# Patient Record
Sex: Female | Born: 1955 | Race: White | Hispanic: No | Marital: Married | Smoking: Never smoker
Health system: Southern US, Community
[De-identification: ages and names within clinical notes are randomized; demographics above are authoritative.]

## PROBLEM LIST (undated history)

## (undated) DIAGNOSIS — F32A Depression, unspecified: Secondary | ICD-10-CM

## (undated) DIAGNOSIS — K219 Gastro-esophageal reflux disease without esophagitis: Secondary | ICD-10-CM

## (undated) DIAGNOSIS — M47816 Spondylosis without myelopathy or radiculopathy, lumbar region: Secondary | ICD-10-CM

## (undated) DIAGNOSIS — M1710 Unilateral primary osteoarthritis, unspecified knee: Secondary | ICD-10-CM

## (undated) DIAGNOSIS — IMO0002 Reserved for concepts with insufficient information to code with codable children: Secondary | ICD-10-CM

## (undated) DIAGNOSIS — E669 Obesity, unspecified: Secondary | ICD-10-CM

## (undated) DIAGNOSIS — F329 Major depressive disorder, single episode, unspecified: Secondary | ICD-10-CM

## (undated) DIAGNOSIS — M545 Low back pain, unspecified: Secondary | ICD-10-CM

## (undated) DIAGNOSIS — F419 Anxiety disorder, unspecified: Secondary | ICD-10-CM

## (undated) DIAGNOSIS — G709 Myoneural disorder, unspecified: Secondary | ICD-10-CM

## (undated) DIAGNOSIS — I82409 Acute embolism and thrombosis of unspecified deep veins of unspecified lower extremity: Secondary | ICD-10-CM

## (undated) DIAGNOSIS — I10 Essential (primary) hypertension: Secondary | ICD-10-CM

## (undated) HISTORY — DX: Low back pain: M54.5

## (undated) HISTORY — DX: Reserved for concepts with insufficient information to code with codable children: IMO0002

## (undated) HISTORY — DX: Obesity, unspecified: E66.9

## (undated) HISTORY — DX: Myoneural disorder, unspecified: G70.9

## (undated) HISTORY — PX: ABDOMINAL HYSTERECTOMY: SHX81

## (undated) HISTORY — DX: Major depressive disorder, single episode, unspecified: F32.9

## (undated) HISTORY — DX: Acute embolism and thrombosis of unspecified deep veins of unspecified lower extremity: I82.409

## (undated) HISTORY — DX: Anxiety disorder, unspecified: F41.9

## (undated) HISTORY — PX: CHOLECYSTECTOMY: SHX55

## (undated) HISTORY — DX: Gastro-esophageal reflux disease without esophagitis: K21.9

## (undated) HISTORY — DX: Unilateral primary osteoarthritis, unspecified knee: M17.10

## (undated) HISTORY — DX: Depression, unspecified: F32.A

## (undated) HISTORY — DX: Spondylosis without myelopathy or radiculopathy, lumbar region: M47.816

## (undated) HISTORY — DX: Low back pain, unspecified: M54.50

---

## 1997-12-08 ENCOUNTER — Ambulatory Visit (HOSPITAL_COMMUNITY): Admission: RE | Admit: 1997-12-08 | Discharge: 1997-12-08 | Payer: Self-pay | Admitting: Obstetrics and Gynecology

## 1999-02-09 ENCOUNTER — Emergency Department (HOSPITAL_COMMUNITY): Admission: EM | Admit: 1999-02-09 | Discharge: 1999-02-09 | Payer: Self-pay | Admitting: Emergency Medicine

## 2001-12-31 ENCOUNTER — Encounter: Payer: Self-pay | Admitting: Emergency Medicine

## 2001-12-31 ENCOUNTER — Inpatient Hospital Stay (HOSPITAL_COMMUNITY): Admission: EM | Admit: 2001-12-31 | Discharge: 2002-01-02 | Payer: Self-pay | Admitting: Emergency Medicine

## 2002-01-01 ENCOUNTER — Encounter: Payer: Self-pay | Admitting: Infectious Diseases

## 2004-12-04 ENCOUNTER — Emergency Department (HOSPITAL_COMMUNITY): Admission: EM | Admit: 2004-12-04 | Discharge: 2004-12-04 | Payer: Self-pay | Admitting: Emergency Medicine

## 2005-09-09 ENCOUNTER — Emergency Department (HOSPITAL_COMMUNITY): Admission: EM | Admit: 2005-09-09 | Discharge: 2005-09-09 | Payer: Self-pay | Admitting: Emergency Medicine

## 2005-09-13 ENCOUNTER — Ambulatory Visit (HOSPITAL_COMMUNITY): Admission: RE | Admit: 2005-09-13 | Discharge: 2005-09-13 | Payer: Self-pay | Admitting: Emergency Medicine

## 2005-09-25 ENCOUNTER — Inpatient Hospital Stay (HOSPITAL_COMMUNITY): Admission: EM | Admit: 2005-09-25 | Discharge: 2005-09-27 | Payer: Self-pay | Admitting: Emergency Medicine

## 2005-09-26 ENCOUNTER — Ambulatory Visit: Payer: Self-pay | Admitting: Internal Medicine

## 2005-09-26 ENCOUNTER — Encounter (INDEPENDENT_AMBULATORY_CARE_PROVIDER_SITE_OTHER): Payer: Self-pay | Admitting: *Deleted

## 2007-03-08 ENCOUNTER — Encounter: Admission: RE | Admit: 2007-03-08 | Discharge: 2007-03-08 | Payer: Self-pay | Admitting: Family Medicine

## 2007-03-19 ENCOUNTER — Encounter: Admission: RE | Admit: 2007-03-19 | Discharge: 2007-03-21 | Payer: Self-pay | Admitting: Family Medicine

## 2007-05-31 ENCOUNTER — Encounter: Admission: RE | Admit: 2007-05-31 | Discharge: 2007-05-31 | Payer: Self-pay | Admitting: Family Medicine

## 2007-07-05 ENCOUNTER — Encounter: Admission: RE | Admit: 2007-07-05 | Discharge: 2007-08-22 | Payer: Self-pay | Admitting: Specialist

## 2007-07-30 ENCOUNTER — Emergency Department (HOSPITAL_COMMUNITY): Admission: EM | Admit: 2007-07-30 | Discharge: 2007-07-30 | Payer: Self-pay | Admitting: Emergency Medicine

## 2007-08-03 ENCOUNTER — Ambulatory Visit: Payer: Self-pay | Admitting: Physical Medicine & Rehabilitation

## 2007-08-03 ENCOUNTER — Inpatient Hospital Stay (HOSPITAL_COMMUNITY)
Admission: RE | Admit: 2007-08-03 | Discharge: 2007-08-10 | Payer: Self-pay | Admitting: Physical Medicine & Rehabilitation

## 2007-09-17 ENCOUNTER — Encounter
Admission: RE | Admit: 2007-09-17 | Discharge: 2007-12-16 | Payer: Self-pay | Admitting: Physical Medicine & Rehabilitation

## 2007-10-17 ENCOUNTER — Ambulatory Visit: Payer: Self-pay | Admitting: Physical Medicine & Rehabilitation

## 2007-11-14 ENCOUNTER — Ambulatory Visit: Payer: Self-pay | Admitting: Physical Medicine & Rehabilitation

## 2007-12-12 ENCOUNTER — Ambulatory Visit: Payer: Self-pay | Admitting: Physical Medicine & Rehabilitation

## 2008-01-09 ENCOUNTER — Encounter
Admission: RE | Admit: 2008-01-09 | Discharge: 2008-04-08 | Payer: Self-pay | Admitting: Physical Medicine & Rehabilitation

## 2008-01-09 ENCOUNTER — Ambulatory Visit: Payer: Self-pay | Admitting: Physical Medicine & Rehabilitation

## 2008-02-04 ENCOUNTER — Ambulatory Visit: Payer: Self-pay | Admitting: Physical Medicine & Rehabilitation

## 2008-02-12 ENCOUNTER — Ambulatory Visit: Payer: Self-pay | Admitting: Physical Medicine & Rehabilitation

## 2008-02-18 ENCOUNTER — Ambulatory Visit: Payer: Self-pay | Admitting: Physical Medicine & Rehabilitation

## 2008-03-25 ENCOUNTER — Ambulatory Visit: Payer: Self-pay | Admitting: Physical Medicine & Rehabilitation

## 2008-04-18 ENCOUNTER — Encounter
Admission: RE | Admit: 2008-04-18 | Discharge: 2008-07-17 | Payer: Self-pay | Admitting: Physical Medicine & Rehabilitation

## 2008-04-21 ENCOUNTER — Ambulatory Visit: Payer: Self-pay | Admitting: Physical Medicine & Rehabilitation

## 2008-05-21 ENCOUNTER — Ambulatory Visit: Payer: Self-pay | Admitting: Physical Medicine & Rehabilitation

## 2008-06-24 ENCOUNTER — Ambulatory Visit: Payer: Self-pay | Admitting: Physical Medicine & Rehabilitation

## 2008-07-17 ENCOUNTER — Encounter
Admission: RE | Admit: 2008-07-17 | Discharge: 2008-10-15 | Payer: Self-pay | Admitting: Physical Medicine & Rehabilitation

## 2008-07-23 ENCOUNTER — Ambulatory Visit: Payer: Self-pay | Admitting: Physical Medicine & Rehabilitation

## 2008-07-28 ENCOUNTER — Ambulatory Visit: Payer: Self-pay | Admitting: Physical Medicine & Rehabilitation

## 2008-08-20 ENCOUNTER — Ambulatory Visit: Payer: Self-pay | Admitting: Physical Medicine & Rehabilitation

## 2008-09-19 ENCOUNTER — Ambulatory Visit: Payer: Self-pay | Admitting: Physical Medicine & Rehabilitation

## 2008-10-16 ENCOUNTER — Encounter
Admission: RE | Admit: 2008-10-16 | Discharge: 2009-01-09 | Payer: Self-pay | Admitting: Physical Medicine & Rehabilitation

## 2008-10-21 ENCOUNTER — Ambulatory Visit: Payer: Self-pay | Admitting: Physical Medicine & Rehabilitation

## 2008-11-21 ENCOUNTER — Ambulatory Visit: Payer: Self-pay | Admitting: Physical Medicine & Rehabilitation

## 2008-12-17 ENCOUNTER — Ambulatory Visit: Payer: Self-pay | Admitting: Physical Medicine & Rehabilitation

## 2009-01-09 ENCOUNTER — Encounter
Admission: RE | Admit: 2009-01-09 | Discharge: 2009-01-29 | Payer: Self-pay | Admitting: Physical Medicine & Rehabilitation

## 2009-01-14 ENCOUNTER — Ambulatory Visit: Payer: Self-pay | Admitting: Physical Medicine & Rehabilitation

## 2009-02-16 ENCOUNTER — Encounter
Admission: RE | Admit: 2009-02-16 | Discharge: 2009-05-07 | Payer: Self-pay | Admitting: Physical Medicine & Rehabilitation

## 2009-02-18 ENCOUNTER — Ambulatory Visit: Payer: Self-pay | Admitting: Physical Medicine & Rehabilitation

## 2009-03-11 ENCOUNTER — Encounter
Admission: RE | Admit: 2009-03-11 | Discharge: 2009-03-11 | Payer: Self-pay | Admitting: Physical Medicine & Rehabilitation

## 2009-03-20 ENCOUNTER — Ambulatory Visit: Payer: Self-pay | Admitting: Physical Medicine & Rehabilitation

## 2009-04-16 ENCOUNTER — Ambulatory Visit: Payer: Self-pay | Admitting: Physical Medicine & Rehabilitation

## 2009-05-07 ENCOUNTER — Encounter
Admission: RE | Admit: 2009-05-07 | Discharge: 2009-07-30 | Payer: Self-pay | Admitting: Physical Medicine & Rehabilitation

## 2009-05-12 ENCOUNTER — Ambulatory Visit: Payer: Self-pay | Admitting: Physical Medicine & Rehabilitation

## 2009-05-27 ENCOUNTER — Ambulatory Visit: Payer: Self-pay | Admitting: Physical Medicine & Rehabilitation

## 2009-06-02 ENCOUNTER — Ambulatory Visit: Payer: Self-pay | Admitting: Physical Medicine & Rehabilitation

## 2009-07-09 ENCOUNTER — Ambulatory Visit: Payer: Self-pay | Admitting: Physical Medicine & Rehabilitation

## 2009-07-30 ENCOUNTER — Encounter
Admission: RE | Admit: 2009-07-30 | Discharge: 2009-10-28 | Payer: Self-pay | Admitting: Physical Medicine & Rehabilitation

## 2009-08-06 ENCOUNTER — Ambulatory Visit: Payer: Self-pay | Admitting: Physical Medicine & Rehabilitation

## 2009-09-18 ENCOUNTER — Ambulatory Visit: Payer: Self-pay | Admitting: Physical Medicine & Rehabilitation

## 2009-10-15 ENCOUNTER — Ambulatory Visit: Payer: Self-pay | Admitting: Physical Medicine & Rehabilitation

## 2009-11-05 ENCOUNTER — Encounter
Admission: RE | Admit: 2009-11-05 | Discharge: 2010-02-03 | Payer: Self-pay | Source: Home / Self Care | Attending: Physical Medicine & Rehabilitation | Admitting: Physical Medicine & Rehabilitation

## 2009-11-12 ENCOUNTER — Ambulatory Visit: Payer: Self-pay | Admitting: Physical Medicine & Rehabilitation

## 2009-12-09 ENCOUNTER — Ambulatory Visit: Payer: Self-pay | Admitting: Physical Medicine & Rehabilitation

## 2010-01-07 ENCOUNTER — Ambulatory Visit: Payer: Self-pay | Admitting: Physical Medicine & Rehabilitation

## 2010-02-03 ENCOUNTER — Encounter
Admission: RE | Admit: 2010-02-03 | Discharge: 2010-03-09 | Payer: Self-pay | Source: Home / Self Care | Attending: Physical Medicine & Rehabilitation | Admitting: Physical Medicine & Rehabilitation

## 2010-02-04 ENCOUNTER — Ambulatory Visit: Payer: Self-pay | Admitting: Physical Medicine & Rehabilitation

## 2010-02-28 ENCOUNTER — Encounter: Payer: Self-pay | Admitting: Emergency Medicine

## 2010-03-15 ENCOUNTER — Ambulatory Visit: Payer: Medicare Other | Admitting: Physical Medicine & Rehabilitation

## 2010-03-15 ENCOUNTER — Ambulatory Visit: Payer: Medicare Other | Attending: Physical Medicine & Rehabilitation

## 2010-03-15 DIAGNOSIS — M47817 Spondylosis without myelopathy or radiculopathy, lumbosacral region: Secondary | ICD-10-CM | POA: Insufficient documentation

## 2010-03-15 DIAGNOSIS — E669 Obesity, unspecified: Secondary | ICD-10-CM

## 2010-03-15 DIAGNOSIS — Z79899 Other long term (current) drug therapy: Secondary | ICD-10-CM | POA: Insufficient documentation

## 2010-03-15 DIAGNOSIS — IMO0002 Reserved for concepts with insufficient information to code with codable children: Secondary | ICD-10-CM

## 2010-03-15 DIAGNOSIS — M1710 Unilateral primary osteoarthritis, unspecified knee: Secondary | ICD-10-CM | POA: Insufficient documentation

## 2010-03-15 DIAGNOSIS — M5380 Other specified dorsopathies, site unspecified: Secondary | ICD-10-CM

## 2010-03-22 ENCOUNTER — Emergency Department (HOSPITAL_COMMUNITY): Payer: Medicare Other

## 2010-03-22 ENCOUNTER — Inpatient Hospital Stay (HOSPITAL_COMMUNITY)
Admission: EM | Admit: 2010-03-22 | Discharge: 2010-03-25 | DRG: 871 | Disposition: A | Discharge status: Home Health | Payer: Medicare Other | Attending: Internal Medicine | Admitting: Internal Medicine

## 2010-03-22 ENCOUNTER — Inpatient Hospital Stay (HOSPITAL_COMMUNITY): Payer: Medicare Other

## 2010-03-22 ENCOUNTER — Encounter (HOSPITAL_COMMUNITY): Payer: Self-pay | Admitting: Radiology

## 2010-03-22 DIAGNOSIS — M1710 Unilateral primary osteoarthritis, unspecified knee: Secondary | ICD-10-CM | POA: Diagnosis present

## 2010-03-22 DIAGNOSIS — L02419 Cutaneous abscess of limb, unspecified: Secondary | ICD-10-CM | POA: Diagnosis present

## 2010-03-22 DIAGNOSIS — R259 Unspecified abnormal involuntary movements: Secondary | ICD-10-CM | POA: Diagnosis present

## 2010-03-22 DIAGNOSIS — M5137 Other intervertebral disc degeneration, lumbosacral region: Secondary | ICD-10-CM | POA: Diagnosis present

## 2010-03-22 DIAGNOSIS — B370 Candidal stomatitis: Secondary | ICD-10-CM | POA: Diagnosis not present

## 2010-03-22 DIAGNOSIS — M51379 Other intervertebral disc degeneration, lumbosacral region without mention of lumbar back pain or lower extremity pain: Secondary | ICD-10-CM | POA: Diagnosis present

## 2010-03-22 DIAGNOSIS — A419 Sepsis, unspecified organism: Principal | ICD-10-CM | POA: Diagnosis present

## 2010-03-22 DIAGNOSIS — Z79899 Other long term (current) drug therapy: Secondary | ICD-10-CM

## 2010-03-22 DIAGNOSIS — G894 Chronic pain syndrome: Secondary | ICD-10-CM | POA: Diagnosis present

## 2010-03-22 DIAGNOSIS — F0390 Unspecified dementia without behavioral disturbance: Secondary | ICD-10-CM | POA: Diagnosis present

## 2010-03-22 DIAGNOSIS — Z6841 Body Mass Index (BMI) 40.0 and over, adult: Secondary | ICD-10-CM

## 2010-03-22 DIAGNOSIS — F329 Major depressive disorder, single episode, unspecified: Secondary | ICD-10-CM | POA: Diagnosis present

## 2010-03-22 DIAGNOSIS — J449 Chronic obstructive pulmonary disease, unspecified: Secondary | ICD-10-CM | POA: Diagnosis present

## 2010-03-22 DIAGNOSIS — I10 Essential (primary) hypertension: Secondary | ICD-10-CM | POA: Diagnosis present

## 2010-03-22 DIAGNOSIS — J180 Bronchopneumonia, unspecified organism: Secondary | ICD-10-CM | POA: Diagnosis present

## 2010-03-22 DIAGNOSIS — J4489 Other specified chronic obstructive pulmonary disease: Secondary | ICD-10-CM | POA: Diagnosis present

## 2010-03-22 DIAGNOSIS — F3289 Other specified depressive episodes: Secondary | ICD-10-CM | POA: Diagnosis present

## 2010-03-22 HISTORY — DX: Essential (primary) hypertension: I10

## 2010-03-22 LAB — DIFFERENTIAL
Basophils Relative: 0 % (ref 0–1)
Monocytes Relative: 8 % (ref 3–12)
Neutro Abs: 13.8 10*3/uL — ABNORMAL HIGH (ref 1.7–7.7)

## 2010-03-22 LAB — URINALYSIS, ROUTINE W REFLEX MICROSCOPIC
Ketones, ur: NEGATIVE mg/dL
Leukocytes, UA: NEGATIVE
Protein, ur: NEGATIVE mg/dL
Specific Gravity, Urine: 1.013 (ref 1.005–1.030)
Urine Glucose, Fasting: NEGATIVE mg/dL
pH: 6 (ref 5.0–8.0)

## 2010-03-22 LAB — ETHANOL: Alcohol, Ethyl (B): <5 mg/dL (ref 0–10)

## 2010-03-22 LAB — CBC
HCT: 42.5 % (ref 36.0–46.0)
MCHC: 30.1 g/dL (ref 30.0–36.0)
MCV: 78.6 fL (ref 78.0–100.0)
Platelets: 326 10*3/uL (ref 150–400)
RBC: 5.41 MIL/uL — ABNORMAL HIGH (ref 3.87–5.11)
RDW: 17 % — ABNORMAL HIGH (ref 11.5–15.5)
WBC: 16.3 10*3/uL — ABNORMAL HIGH (ref 4.0–10.5)

## 2010-03-22 LAB — BASIC METABOLIC PANEL
BUN: 9 mg/dL (ref 6–23)
CO2: 29 mEq/L (ref 19–32)
Creatinine, Ser: 0.97 mg/dL (ref 0.4–1.2)
GFR calc non Af Amer: >60 mL/min (ref >60)
Glucose, Bld: 103 mg/dL — ABNORMAL HIGH (ref 70–99)
Sodium: 140 mEq/L (ref 135–145)

## 2010-03-22 LAB — PROTIME-INR: INR: 1.14 (ref 0.00–1.49)

## 2010-03-22 LAB — URINE MICROSCOPIC-ADD ON

## 2010-03-22 LAB — HEPATIC FUNCTION PANEL
Alkaline Phosphatase: 69 U/L (ref 39–117)
Indirect Bilirubin: 0.3 mg/dL (ref 0.3–0.9)

## 2010-03-22 LAB — POCT CARDIAC MARKERS: Troponin i, poc: <0.05 ng/mL (ref 0.00–0.09)

## 2010-03-22 LAB — LACTIC ACID, PLASMA: Lactic Acid, Venous: 2.5 mmol/L — ABNORMAL HIGH (ref 0.5–2.2)

## 2010-03-22 LAB — CK TOTAL AND CKMB (NOT AT ARMC): Relative Index: 0.8 (ref 0.0–2.5)

## 2010-03-22 MED ORDER — IOHEXOL 350 MG/ML SOLN: 100.0000 mL | Freq: Once | INTRAVENOUS | Status: AC | PRN

## 2010-03-23 LAB — DIFFERENTIAL
Basophils Relative: 0 % (ref 0–1)
Lymphocytes Relative: 13 % (ref 12–46)
Lymphs Abs: 2.2 10*3/uL (ref 0.7–4.0)
Monocytes Absolute: 1.9 10*3/uL — ABNORMAL HIGH (ref 0.1–1.0)
Monocytes Relative: 11 % (ref 3–12)
Neutro Abs: 13.5 10*3/uL — ABNORMAL HIGH (ref 1.7–7.7)
Neutrophils Relative %: 76 % (ref 43–77)

## 2010-03-23 LAB — COMPREHENSIVE METABOLIC PANEL
AST: 15 U/L (ref 0–37)
CO2: 29 mEq/L (ref 19–32)
Chloride: 103 mEq/L (ref 96–112)
Creatinine, Ser: 1.18 mg/dL (ref 0.4–1.2)
GFR calc Af Amer: 58 mL/min — ABNORMAL LOW (ref >60)
GFR calc non Af Amer: 48 mL/min — ABNORMAL LOW (ref >60)
Glucose, Bld: 131 mg/dL — ABNORMAL HIGH (ref 70–99)
Total Bilirubin: 0.3 mg/dL (ref 0.3–1.2)

## 2010-03-23 LAB — CBC
HCT: 39.1 % (ref 36.0–46.0)
Hemoglobin: 11.8 g/dL — ABNORMAL LOW (ref 12.0–15.0)
MCH: 24.2 pg — ABNORMAL LOW (ref 26.0–34.0)
MCV: 80.1 fL (ref 78.0–100.0)
RBC: 4.88 MIL/uL (ref 3.87–5.11)

## 2010-03-23 LAB — LIPID PANEL
Cholesterol: 130 mg/dL (ref 0–200)
Triglycerides: 77 mg/dL (ref <150)

## 2010-03-24 LAB — CBC
HCT: 36.8 % (ref 36.0–46.0)
MCH: 24.3 pg — ABNORMAL LOW (ref 26.0–34.0)
MCV: 79.1 fL (ref 78.0–100.0)
RBC: 4.65 MIL/uL (ref 3.87–5.11)
RDW: 17.8 % — ABNORMAL HIGH (ref 11.5–15.5)
WBC: 9.4 10*3/uL (ref 4.0–10.5)

## 2010-03-25 LAB — URINE CULTURE
Colony Count: NO GROWTH
Culture: NO GROWTH

## 2010-03-27 NOTE — Discharge Summary (Signed)
NAMEREMONIA, OTTE               ACCOUNT NO.:  [id number]  MEDICAL RECORD NO.:  [id number]           PATIENT TYPE:  I  LOCATION:  5507                         FACILITY:  [facility]  PHYSICIAN:  Lonia Blood, M.D.       DATE OF BIRTH:  [birth year]  DATE OF ADMISSION:  03/22/2010 DATE OF DISCHARGE:  03/25/2010                              DISCHARGE SUMMARY   PRIMARY CARE PHYSICIAN:  Delaney Meigs, MD  DISCHARGE DIAGNOSES: 1. Sepsis, resolved. 2. Delirium, resolved. 3. Right lower extremity cellulitis, resolved. 4. Bronchopneumonia, the patient needs followup CT scan of the chest     without IV contrast to assure that her pulmonary nodules are from     bronchopneumonia, not some other condition. 5. Chronic obstructive pulmonary disease. 6. Hypertension. 7. Morbid obesity. 8. Chronic back pain. 9. Widespread osteoarthritis, more pronounced in the knees. 10.Status post cholecystectomy. 11.Status post hysterectomy.  DISCHARGE MEDICATIONS: 1. Celexa 40 mg daily. 2. Fentanyl patch 50 mcg every 3 days. 3. Flexeril 10 mg 3 times a day. 4. Fluconazole 100 mg daily for 10 days. 5. Gabapentin 300 mg 3 times a day. 6. Levaquin 500 mg daily for 4 days. 7. Losartan 50 mg daily. 8. Omeprazole 20 mg daily. 9. Oxycodone 10 mg by mouth every 4 hours as needed for pain. 10.Premarin 0.625 mg estrogens 1 tablet daily. 11.Singular 10 mg daily.  CONDITION ON DISCHARGE:  Ms. Acocella was discharged in good condition.  At the time of discharge, temperature was 98.1, heart rate 73, respirations 18, blood pressure 142/80, saturation 97% on room air.  The patient was alert, oriented, no acute distress.  She is to follow up with her primary care physician, Delaney Meigs, MD, who is going to schedule another CT scan of the chest 2 months after the date of March 22, 2010.  Otherwise, the patient was instructed to resume her normal physical activities and continue a low-calorie diet to  continue to lose weight.  PROCEDURE THIS ADMISSION: 1. On March 22, 2010, the patient underwent a head CT without     contrast, which showed normal head CT. 2. On March 22, 2010, chest x-ray that showed low volumes. 3. On March 22, 2010, CT angiogram of the chest, which was negative     for pulmonary emboli, but it did show nodular opacities     bilaterally, most likely due to bronchopneumonia.  CONSULTATIONS THIS ADMISSION:  No consultation obtained.  HISTORY AND PHYSICAL:  Refer to the dictated H and P done by Brendia Sacks, MD  HOSPITAL COURSE:  Ms. Bolanos is a 55 year old woman with history of chronic pain syndrome, who presented to the emergency room with confusion, sepsis like syndrome.  Upon admission, she was tachycardic, febrile with a white blood cell count of 16,000.  She was also confused. She was admitted to step-down unit where she was placed on empiric antibiotics with Rocephin and Avelox.  Urine cultures and blood cultures were sent out.  The source of the sepsis was found to be in the right lower extremity cellulitis and bronchopneumonia.  Now, it is conceivable that the lung  nodules could be hematogenous prior to the possible transient bacteremia from the cellulitis, but we could never prove that since the blood cultures remained negative.  Ms. Tamez had the course of very good recovery, and we were able to move her out of the step-down by hospital day #2.  We then proceeded by switching her antibiotics to oral Levaquin and she remained stable without signs of recurrent sepsis or worsening infectious process.  The patient's cellulitis of right lower extremity improved dramatically during this hospitalization.  She did not have any signs of respiratory failure, hypoxia, or increased work of breathing.  We felt that the patient is in stable condition to be discharged today, March 25, 2010 and to follow up with her primary care physician, Delaney Meigs, MD.     Lonia Blood, M.D.     SL/MEDQ  D:  03/25/2010  T:  03/26/2010  Job:  [id number]  cc:   Delaney Meigs, M.D.  Electronically Signed by Lonia Blood M.D. on 03/27/2010 04:49:28 PM

## 2010-03-29 LAB — CULTURE, BLOOD (ROUTINE X 2)
Culture  Setup Time: 201202140115
Culture: NO GROWTH

## 2010-04-15 ENCOUNTER — Ambulatory Visit: Payer: Medicare Other

## 2010-04-15 ENCOUNTER — Encounter: Payer: Medicare Other | Attending: Physical Medicine & Rehabilitation

## 2010-04-15 DIAGNOSIS — M545 Low back pain, unspecified: Secondary | ICD-10-CM | POA: Insufficient documentation

## 2010-04-15 DIAGNOSIS — M1710 Unilateral primary osteoarthritis, unspecified knee: Secondary | ICD-10-CM | POA: Insufficient documentation

## 2010-04-15 DIAGNOSIS — IMO0002 Reserved for concepts with insufficient information to code with codable children: Secondary | ICD-10-CM

## 2010-04-15 DIAGNOSIS — M129 Arthropathy, unspecified: Secondary | ICD-10-CM | POA: Insufficient documentation

## 2010-04-15 DIAGNOSIS — M25569 Pain in unspecified knee: Secondary | ICD-10-CM | POA: Insufficient documentation

## 2010-04-15 DIAGNOSIS — E669 Obesity, unspecified: Secondary | ICD-10-CM

## 2010-04-15 NOTE — H&P (Signed)
NAMEMARLIN, [name]               ACCOUNT NO.:  [id number]  MEDICAL RECORD NO.:  [id number]           PATIENT TYPE:  E  LOCATION:  [facility]                         FACILITY:  [facility]  PHYSICIAN:  Brendia Sacks, MD    DATE OF BIRTH:  05/25/1955  DATE OF ADMISSION:  03/22/2010 DATE OF DISCHARGE:                             HISTORY & PHYSICAL   PRIMARY CARE PROVIDER:  Delaney Meigs, MD  PRIMARY PAIN SPECIALIST:  Dr. Alphonsus Sias.  REFERRING PHYSICIAN:  Rhae Lerner. Margretta Ditty, MD  CHIEF COMPLAINTS:  Altered speech.  HISTORY OF PRESENT ILLNESS:  This is a 55 year old woman who presents to the emergency room with multiple issues.  At this point, the patient can provide minimal history, having received pain medication in the emergency room earlier this morning.  History is primarily provided by her daughter, [name], who is at the bedside.  The patient does live at home with her daughter.  The patient's daughter reports that about 3:30 this morning her mother called out and when she went to evaluate her mother, her mother was speaking incoherently, words did not make sense and were not intelligible.  This lasted for approximately 3 hours.  The patient had complained of her head hurting.  EMS was then called for further evaluation.  The patient also experienced some spasms of her right leg and arm which is not uncommon for her.  She has longstanding right-side issues especially for the leg and she periodically will have severe spasms in right leg and sometimes her right arm.  She has little feeling in her right leg, is able to transfer but not walk.  Daughter takes care of all the patient's medications and is quite certain that the patient was not able to overdose on or abuse pain medications.  Her daughter changed the patient's fentanyl patch yesterday.  She always removes the previous patch before applying a new one.  She keeps her medications locked.  She reports the mother  stays "confused," but her speech is always intelligible.  She reports that her speech improved in the emergency room here, although she has been quite somnolent secondary to pain medication here.  Daughter reports that the patient had been doing well at home until early this morning.  REVIEW OF SYSTEMS:  Negative for fever, chills at home.  No changes to her vision, sore throat, rash, or new muscle aches.  She does have chronic right lower extremity problems secondary to a crushed knee and has little feeling in her right leg.  She is able to transfer to the commode and to bed but that is not the limit of her mobility.  Negative for chest pain or new shortness of breath.  She does have chronic shortness of breath secondary to asthma.  Negative for abdominal pain or diarrhea.  Positive for nausea and vomiting today.  Negative for dysuria or bleeding.  NEUROLOGIC:  As far as the daughter can tell, her only neurologic deficit was words being incoherent.  Her daughter did not note any facial drooping or weakness of an arm or leg or any other focal deficits.  PAST MEDICAL  HISTORY: 1. COPD managed with inhalers.  The patient is not oxygen-dependent. 2. Hypertension. 3. Morbid obesity. 4. Chronic back pain. 5. Chronic leg spasms with decreased sensation in the right leg and     limited mobility. 6. Possible early dementia as the daughter reports the patient does     seem to be confused all the time.  This also may be related to pain     medication.  PAST SURGICAL HISTORY: 1. Cholecystectomy. 2. Total abdominal hysterectomy and bilateral salpingo-oophorectomy.  SOCIAL HISTORY:  Nonsmoker, nondrinker.  She lives with her daughter.  ALLERGIES:  SULFA which causes her to be "sick."  FAMILY HISTORY:  Mother and father had heart disease.  MEDICATIONS: 1. Flexeril 10 mg p.o. t.i.d. 2. Oxycodone 10 mg every 4-6 hours as needed for pain. 3. Losartan 50 mg p.o. daily. 4. Gabapentin 300  mg p.o. t.i.d. 5. Fentanyl patch 50 mcg every 3 days to skin. 6. Celexa 40 mg p.o. daily. 7. Omeprazole 20 mg 1 capsule p.o. daily. 8. Premarin 0.65 mg p.o. daily. 9. Singular 10 mg p.o. daily.  PHYSICAL EXAMINATION:  This is 55 year old morbidly obese woman who appears diaphoretic and somnolent. VITAL SIGNS:  Initial recorded blood pressure 142/92 with a pulse 113, respirations of 18, and temperature 99.2.  Most recent vitals include a temperature of 102.3, pulse of 124, respirations of 18, and blood pressure of 124/69.  Review of her last several systolic blood pressures during my examination did not reveal hypotension.  Oxygen saturation 91% as recorded. GENERAL:  The patient appears to be mild-to-moderately ill and quite somnolent.  She does awaken further throughout the interview and examination to the point where she was able to maintain her alertness spontaneously and did participate with examination answer to simple questions. HEAD:  Appears to be normal except for diaphoresis.  Eyes:  Lids, irises, conjunctive appear unremarkable.  The right pupil is 3 mm, the left pupil is 2.5 mm.  Both are round and reactive to light.  Her daughter is not sure whether she has baseline anisocoria. ENT:  Hearing is grossly normal.  Lips, gums, and tongue appear unremarkable.  Dentition is in poor repair. NECK: Supple.  No lymphadenopathy or masses.  No thyromegaly. CHEST:  Clear to auscultation bilaterally with decreased breath sounds but no frank wheezes, rales, or rhonchi.  There appears to be normal respiratory effort without abdominal breathing. CARDIOVASCULAR:  Tachycardic, regular rhythm.  No murmur, rub, or gallop.  No lower extremity edema. ABDOMEN:  Soft, nontender, nondistended.  No masses are appreciated.  It is obese.  The pannus fold appears unremarkable.  SKIN:  Appears to be normal without rash or induration. EXTREMITIES:  Nontender to palpation and strength in the  upper extremities is 5/5 and symmetric.  There is no dysdiadochokinesis. There is no pronator drift.  The lower extremities, the right lower extremity strength is 3+-4/5, the left lower extremity 4+/5.  Cranial nerves II-XII are intact except as documented above.  Speech is fluent and clear although somewhat limited by the patient's somnolence.  Cannot assess mood or affect at this point.  IMAGING: 1. CT of the head, February 13:  Normal. 2. Chest x-ray, February 13:  Low volume exam with vascular congestion     and cardiomegaly. 3. CT angiogram of the chest, February 13.  No evidence for acute     pulmonary embolus in the central or segmental arteries with     suboptimal visualization of the subsegmental pulmonary arteries.  Big nodular opacities which could represent developing     bronchopneumonia.  ANCILLARY STUDIES:  Independent review of EKG shows sinus tachycardia with  no acute changes seen.  PERTINENT LABORATORY STUDIES: 1. CBC notable for white blood cell count of 16.3, hemoglobin of 12.8,     platelet count of 326.  Basic metabolic panel is essentially     unremarkable.  One set of cardiac markers are negative.  Urinalysis     appears to be equivocal.  ASSESSMENT AND PLAN:  This is a 55 year old woman who presents with sepsis. 1. Sepsis secondary to community-acquired pneumonia.  At this point,     she remains normotensive; however, her condition does appear to be     guarding.  We will admit her to step-down for empiric antibiotic     therapy, continue maintenance IV fluids at this point.  Follow her     blood pressure closely.  We will check a stat serum lactic acid and     procalcitonin and follow up on these.  Would consider critical care     consultation if her condition worsens at this point.  However, her     volume status appears to be adequate as does her hemodynamic     status. 2. Community-acquired pneumonia.  Start empiric antibiotic therapy. 3.  History of slurred speech.  The patient's clinical presentation is     suggestive of an acute delirium, possibly superimposed on mild     chronic dementia.  However, the patient has never displayed slurred     speech before.  Her daughter takes care of all her narcotics and it     does not appear that it is possible that the patient has misused     her medications.  The other major factor to consider with her     slurred speech would be a TIA or and an ischemic stroke.  CT of the     head was negative. I think at this point although her symptoms are     likely related to her primary illness, her story is concerning     enough to suggest the possibility of TIA that I think warrants a     stroke evaluation.  She has already had a negative CT of the head.     We will admit with neuro checks, follow the protocol, and hopefully     we be able to obtain further imaging as the patient clinically     stabilizes.  Her neurologic exam is grossly nonfocal and I would     favor sepsis and community-acquired pneumonia over stroke.  I     discussed these possibilities with the patient's daughter. 4. Chronic obstructive pulmonary disease, this appears to be stable.     Continue all albuterol and Atrovent nebulizers here in the     hospital. 5. Hypertension.  Continue to follow as above. 6. Morbid obesity. 7. Full code. 8. The patient's de facto power of attorney is her daughter, Bradi Arbuthnot [phone].  The plan was discussed with her.     Brendia Sacks, MD     DG/MEDQ  D:  03/22/2010  T:  03/22/2010  Job:  [id number]  cc:   Alphonsus Sias  Electronically Signed by Brendia Sacks  on 04/14/2010 09:13:09 PM

## 2010-05-13 ENCOUNTER — Encounter: Payer: Medicare Other | Attending: Physical Medicine & Rehabilitation

## 2010-05-13 DIAGNOSIS — M545 Low back pain, unspecified: Secondary | ICD-10-CM | POA: Insufficient documentation

## 2010-05-13 DIAGNOSIS — M25569 Pain in unspecified knee: Secondary | ICD-10-CM | POA: Insufficient documentation

## 2010-05-13 DIAGNOSIS — M47817 Spondylosis without myelopathy or radiculopathy, lumbosacral region: Secondary | ICD-10-CM | POA: Insufficient documentation

## 2010-05-13 DIAGNOSIS — M1710 Unilateral primary osteoarthritis, unspecified knee: Secondary | ICD-10-CM | POA: Insufficient documentation

## 2010-05-13 DIAGNOSIS — IMO0002 Reserved for concepts with insufficient information to code with codable children: Secondary | ICD-10-CM

## 2010-05-13 DIAGNOSIS — E669 Obesity, unspecified: Secondary | ICD-10-CM

## 2010-06-13 ENCOUNTER — Emergency Department (HOSPITAL_COMMUNITY): Payer: Medicare Other

## 2010-06-13 ENCOUNTER — Inpatient Hospital Stay (HOSPITAL_COMMUNITY)
Admission: EM | Admit: 2010-06-13 | Discharge: 2010-06-16 | DRG: 593 | Disposition: A | Discharge status: Home Health | Payer: Medicare Other | Attending: Internal Medicine | Admitting: Internal Medicine

## 2010-06-13 DIAGNOSIS — G8929 Other chronic pain: Secondary | ICD-10-CM | POA: Diagnosis present

## 2010-06-13 DIAGNOSIS — J4489 Other specified chronic obstructive pulmonary disease: Secondary | ICD-10-CM | POA: Diagnosis present

## 2010-06-13 DIAGNOSIS — K219 Gastro-esophageal reflux disease without esophagitis: Secondary | ICD-10-CM | POA: Diagnosis present

## 2010-06-13 DIAGNOSIS — L97509 Non-pressure chronic ulcer of other part of unspecified foot with unspecified severity: Principal | ICD-10-CM | POA: Diagnosis present

## 2010-06-13 DIAGNOSIS — F341 Dysthymic disorder: Secondary | ICD-10-CM | POA: Diagnosis present

## 2010-06-13 DIAGNOSIS — M549 Dorsalgia, unspecified: Secondary | ICD-10-CM | POA: Diagnosis present

## 2010-06-13 DIAGNOSIS — Z6841 Body Mass Index (BMI) 40.0 and over, adult: Secondary | ICD-10-CM

## 2010-06-13 DIAGNOSIS — J449 Chronic obstructive pulmonary disease, unspecified: Secondary | ICD-10-CM | POA: Diagnosis present

## 2010-06-13 DIAGNOSIS — L02619 Cutaneous abscess of unspecified foot: Secondary | ICD-10-CM | POA: Diagnosis present

## 2010-06-13 DIAGNOSIS — E46 Unspecified protein-calorie malnutrition: Secondary | ICD-10-CM | POA: Diagnosis present

## 2010-06-13 DIAGNOSIS — G608 Other hereditary and idiopathic neuropathies: Secondary | ICD-10-CM | POA: Diagnosis present

## 2010-06-13 DIAGNOSIS — I10 Essential (primary) hypertension: Secondary | ICD-10-CM | POA: Diagnosis present

## 2010-06-13 DIAGNOSIS — M5430 Sciatica, unspecified side: Secondary | ICD-10-CM | POA: Diagnosis present

## 2010-06-13 LAB — COMPREHENSIVE METABOLIC PANEL
ALT: 11 U/L (ref 0–35)
AST: 11 U/L (ref 0–37)
CO2: 31 mEq/L (ref 19–32)
Calcium: 8.7 mg/dL (ref 8.4–10.5)
Chloride: 100 mEq/L (ref 96–112)
GFR calc Af Amer: >60 mL/min (ref >60)
GFR calc non Af Amer: >60 mL/min (ref >60)
Sodium: 139 mEq/L (ref 135–145)

## 2010-06-13 LAB — DIFFERENTIAL
Basophils Absolute: 0 10*3/uL (ref 0.0–0.1)
Basophils Relative: 0 % (ref 0–1)
Neutro Abs: 6 10*3/uL (ref 1.7–7.7)
Neutrophils Relative %: 63 % (ref 43–77)

## 2010-06-13 LAB — CBC
Hemoglobin: 12.4 g/dL (ref 12.0–15.0)
RBC: 5.07 MIL/uL (ref 3.87–5.11)
WBC: 9.5 10*3/uL (ref 4.0–10.5)

## 2010-06-14 ENCOUNTER — Ambulatory Visit: Payer: Medicare Other | Admitting: Physical Medicine & Rehabilitation

## 2010-06-14 ENCOUNTER — Encounter: Payer: Medicare Other | Admitting: Neurosurgery

## 2010-06-14 ENCOUNTER — Inpatient Hospital Stay (HOSPITAL_COMMUNITY): Payer: Medicare Other

## 2010-06-14 LAB — COMPREHENSIVE METABOLIC PANEL WITH GFR
ALT: 9 U/L (ref 0–35)
AST: 10 U/L (ref 0–37)
Albumin: 2.6 g/dL — ABNORMAL LOW (ref 3.5–5.2)
Alkaline Phosphatase: 64 U/L (ref 39–117)
BUN: 14 mg/dL (ref 6–23)
CO2: 30 meq/L (ref 19–32)
Calcium: 8.3 mg/dL — ABNORMAL LOW (ref 8.4–10.5)
Chloride: 100 meq/L (ref 96–112)
Creatinine, Ser: 0.92 mg/dL (ref 0.4–1.2)
GFR calc non Af Amer: >60 mL/min
Glucose, Bld: 88 mg/dL (ref 70–99)
Potassium: 3.6 meq/L (ref 3.5–5.1)
Sodium: 138 meq/L (ref 135–145)
Total Bilirubin: 0.3 mg/dL (ref 0.3–1.2)
Total Protein: 6.4 g/dL (ref 6.0–8.3)

## 2010-06-14 LAB — CBC
MCH: 24.2 pg — ABNORMAL LOW (ref 26.0–34.0)
MCV: 78.3 fL (ref 78.0–100.0)
Platelets: 319 10*3/uL (ref 150–400)
RBC: 4.84 MIL/uL (ref 3.87–5.11)

## 2010-06-14 LAB — PHOSPHORUS: Phosphorus: 3.6 mg/dL (ref 2.3–4.6)

## 2010-06-14 LAB — MRSA PCR SCREENING: MRSA by PCR: POSITIVE — AB

## 2010-06-14 LAB — MAGNESIUM: Magnesium: 2 mg/dL (ref 1.5–2.5)

## 2010-06-14 MED ORDER — IOHEXOL 300 MG/ML  SOLN
100.0000 mL | Freq: Once | INTRAMUSCULAR | Status: AC | PRN
  Administered 2010-06-14: 100 mL via INTRAVENOUS

## 2010-06-15 ENCOUNTER — Encounter: Payer: Medicare Other | Admitting: Neurosurgery

## 2010-06-15 LAB — BASIC METABOLIC PANEL
Calcium: 8.4 mg/dL (ref 8.4–10.5)
Creatinine, Ser: 0.98 mg/dL (ref 0.4–1.2)
GFR calc Af Amer: >60 mL/min (ref >60)
GFR calc non Af Amer: 59 mL/min — ABNORMAL LOW (ref >60)

## 2010-06-16 ENCOUNTER — Encounter: Payer: Medicare Other | Attending: Neurosurgery | Admitting: Neurosurgery

## 2010-06-16 DIAGNOSIS — M25569 Pain in unspecified knee: Secondary | ICD-10-CM | POA: Insufficient documentation

## 2010-06-16 DIAGNOSIS — M1710 Unilateral primary osteoarthritis, unspecified knee: Secondary | ICD-10-CM | POA: Insufficient documentation

## 2010-06-16 DIAGNOSIS — M545 Low back pain, unspecified: Secondary | ICD-10-CM | POA: Insufficient documentation

## 2010-06-16 DIAGNOSIS — R209 Unspecified disturbances of skin sensation: Secondary | ICD-10-CM | POA: Insufficient documentation

## 2010-06-16 DIAGNOSIS — M62838 Other muscle spasm: Secondary | ICD-10-CM | POA: Insufficient documentation

## 2010-06-16 DIAGNOSIS — M48061 Spinal stenosis, lumbar region without neurogenic claudication: Secondary | ICD-10-CM

## 2010-06-16 DIAGNOSIS — IMO0002 Reserved for concepts with insufficient information to code with codable children: Secondary | ICD-10-CM | POA: Insufficient documentation

## 2010-06-16 DIAGNOSIS — M129 Arthropathy, unspecified: Secondary | ICD-10-CM | POA: Insufficient documentation

## 2010-06-16 LAB — BASIC METABOLIC PANEL
BUN: 10 mg/dL (ref 6–23)
CO2: 31 mEq/L (ref 19–32)
Chloride: 101 mEq/L (ref 96–112)
Glucose, Bld: 95 mg/dL (ref 70–99)
Potassium: 3.8 mEq/L (ref 3.5–5.1)

## 2010-06-16 LAB — CBC
HCT: 38.8 % (ref 36.0–46.0)
Hemoglobin: 12 g/dL (ref 12.0–15.0)
MCV: 78.5 fL (ref 78.0–100.0)
RBC: 4.94 MIL/uL (ref 3.87–5.11)
RDW: 16.1 % — ABNORMAL HIGH (ref 11.5–15.5)
WBC: 9.2 10*3/uL (ref 4.0–10.5)

## 2010-06-17 NOTE — Assessment & Plan Note (Signed)
[name] is back regarding her low back pain and knee pain.  She had an appointment with Dr. Riley Kill yesterday, but had to cancel because she was still in the hospital.  She was just discharged today due to an infection in her right second toe.  She describes her generalized back pain level is 8-7 and is unchanged. She has some sharp, burning, stabbing, tingling and aching.  General activity at the level she rates are around 5.  Pain is worse at night and in the evening.  Sleep patterns are poor.  She most all activities aggravate her even though she has lost weight leading to possible right knee surgery.  Medications tend to help with a pain.  Mobility, she is in a wheelchair.  She does not climb steps or drive.  She is unemployed.  REVIEW OF SYSTEMS:  Notable for bladder control problems, numbness, tingling, trouble walking, spasms and  She had bleeds easily and has some constipation.  PAST MEDICAL HISTORY:  Unchanged.  SOCIAL HISTORY:  She is married.  She lives with her daughter.  Physical exam shows her blood pressure to be 147/90, pulse is 90, respirations 20 and O2 sats 91 on room air.  Her motor strength is diminished somewhat as well as her sensation in the lower extremities. She cannot stand today due to the pain in her foot.  Cognitively, she is alert and oriented x3.  Her affect is quite bright.  She is happy she has lost some weight and she is leaning toward her new possible knee replacement.  Constitutionally, she is morbidly obese.  She states she thinks she is now 385, down about 25 pounds.  ASSESSMENT: 1. Lumbar facet arthropathy with radiculopathy. 2. Osteoarthritis of the knees, right knee trauma. 3. Morbid obesity.  PLAN: 1. The patient will follow up with Dr. Riley Kill in a month since she has     not seen him a while. 2. I went ahead and refilled her oxycodone IR 10 mg 1 every 6 hours 90     with no refill, fentanyl patch transdermal 50 mcg 1 every 72 with  no refill. 3. She will continue her weight loss program working toward her knee     replacement. 4. She will follow up regarding an infection in her right foot with     her discharge physician at [facility] [place].  Her questions     were encouraged and answered.  Dr. Riley Kill will see her in a month.     Shontell Prosser L. [name] Electronically Signed    RLW/MedQ D:  06/16/2010 14:59:14  T:  06/17/2010 03:39:25  Job #:  [id number]

## 2010-06-20 LAB — CULTURE, BLOOD (ROUTINE X 2): Culture  Setup Time: 201205070006

## 2010-06-22 NOTE — Discharge Summary (Signed)
[name], [name]                ACCOUNT NO.:  [id number]   MEDICAL RECORD NO.:  [id number]          PATIENT TYPE:  IPS   LOCATION:  4008                         FACILITY:  [facility]   PHYSICIAN:  Ranelle Oyster, M.D.DATE OF BIRTH:  [birth year]   DATE OF ADMISSION:  08/03/2007  DATE OF DISCHARGE:  08/10/2007                               DISCHARGE SUMMARY   DISCHARGE DIAGNOSES:  1. Lumbar facet arthropathy with radiculopathy, L4-S1.  2. Escherichia coli urinary tract infection.  3. History of depression with anxiety features.  4. Chronic obstructive pulmonary disease with asthmatic component.  5. History of methicillin-resistant Staphylococcus aureus      colonization.  6. Morbid obesity.   HISTORY OF PRESENT ILLNESS:  [name] is a 55 year old female with  history of morbid obesity, COPD, and low back pain with radiculopathy in  right lower extremity for few weeks prior to admission to [facility] on July 31, 2007.  CT of L-spine showed facet arthritic changes  at L4-L5 and L5-S1 with disk flattening at L5-S1. The patient was  started on IV antiinflammatories, with some relief in her  symptomatology.  Neurology was consulted for input and felt patient with  L5 radiculopathy with pronounced weakness.  Physical therapy was  recommended for treatment.  The patient started on steroid dose pack and  therapies initiated.  Currently, she is at min assist for transfers and  able to tolerate greater than 30 minutes of upper-extremity and lower-  extremity therapeutic exercises.  CIR consulted for further therapies.   PAST MEDICAL HISTORY:  1. Significant for MRSA colonization with occasional pustular      eruptions.  2. History of decubitus ulcers in the past.  3. Depression.  4. Anxiety.  5. COPD with asthmatic component.  6. Hysterectomy and right salpingo-oophorectomy.  7. History of shingles.  8. GERD.  9. Chronic constipation.  10.Morbid obesity with BMI of 61.2.   ALLERGIES:  SULFA.   FAMILY HISTORY:  Positive for COPD, lupus, and CHF.   SOCIAL HISTORY:  The patient is married and lives in a mobile home with  5 steps at entry.  Husband is semi-retired.  The patient is disabled  secondary to back problems, used to work in a factory prior to 2001.  The patient does not use any alcohol or tobacco.  Husband, however, is a  smoker.  The patient has had decreasing mobility since fall in May 2009,  uses cane and rolling walker for mobility.   HOSPITAL COURSE:  [name] was admitted to Rehabilitation on  August 03, 2007, for inpatient therapies to consist of PT/OT daily.  Past  admission, the patient was continued on prednisone taper.  Pain was  managed with p.r.n. use of oxycodone and Ultram.  She was started on  Neurontin for radiculopathy, and this was increased to 300 mg p.o.  b.i.d., with improvement in her symptomatology.   Labs were done at past admission revealing hemoglobin 12.1, hematocrit  36.1, white count 10.5, and platelets 319.  Check of electrolytes  revealed sodium 138, potassium 4.6, chloride 105, CO2 of  25, BUN 17,  creatinine 0.86, and glucose 99.  A UA/UC done showed greater than  100,000 colonies of E. coli, and the patient was treated with 7-day  course of Keflex for this.  The patient's leukocytosis secondary to UTI  has resolved.  Last check of CBC of August 10, 2007, shows white count at  9.4 and H&H stable at 12.3 and 37.5.   The patient's mood has been stable.  The patient has been motivated and  has been participating along well in therapy.  Secondary to right lower  extremity weakness and knee instability a right knee brace was ordered  for support.  Currently, the patient is able to perform all transfers at  supervision level.  She is at supervision level for ambulating 125 feet  with standard walker.  She is able to perform car transfers with min  assist, total assist for wheelchair mobility.  She is able to  navigate  ramp and curb with close supervision.  OT has been working with the  patient in use of assistive equipment as well as endurance for ADL  needs.  Currently, the patient requires distant supervision for high-  level tasks.  She is able to complete setup of clothing as well as  independently able to use rolling walker and assistive equipment for  lower-body dressing.  She is at close supervision to contact-guard  assist for shower transfers.  The patient will continue to receive  further followup Home-Health PT/OT by [facility] past discharge.  On August 10, 2007, the patient is discharged to home in improved  condition.   DISCHARGE MEDICATIONS:  1. Premarin 0.625 mg per day.  2. Senokot-S 2 p.o. at bedtime.  3. Xanax 0.5 mg q.i.d.  4. Celexa 40 mg a day.  5. Advair 500/50 two puffs b.i.d.  6. Spiriva 18 mcg 1 inhalation daily.  7. Singulair 10 mg a day.  8. Atacand 16 mg a day.  9. Flexeril 10 mg q.8 h.  10.Mobic 7.5 mg 3 p.o. per day.  11.Neurontin 300 mg b.i.d.  12.Keflex 250 mg q.8 h. through August 11, 2007.  13.Oxycodone 5 mg 1-2 q.6-8 h. p.r.n. pain, #80 prescribed.  14.Prilosec 20 mg a day.  15.Ultram 50 mg 1 p.o. q. 6-8 h. p.r.n. pain.   DIET:  Low fat.   ACTIVITY LEVEL:  Intermittent supervision with no strenuous activity.  No alcohol, no smoking, no driving.  Walk using a walker.  EVA brace,  right knee, when walking for stabilization.   SPECIAL INSTRUCTIONS:  Do not use Darvocet.  [facility] to  provide PT/OT.   FOLLOW UP:  The patient to follow up with Dr. Lysbeth Galas for routine check  and for pain management.  Follow up with Dr. Riley Kill as needed.  Follow  up with Dr. Jillyn Hidden as needed.      Greg Cutter, P.A.      Ranelle Oyster, M.D.  Electronically Signed    PP/MEDQ  D:  08/10/2007  T:  08/10/2007  Job:  [id number]   cc:   Delaney Meigs, M.D.  Dr. Jillyn Hidden

## 2010-06-22 NOTE — Assessment & Plan Note (Signed)
[name] is back regarding her multiple pain complaints.  She had about 6  weeks of relief with the right knee injection back at the end of June.  She is still having some knee pain, some problems with her back and  arms.  Her pain is 8/10.  She describes pain as burning and stabbing.  Pain interferes with general activity, relations with others, enjoyment  of life on a moderate-to-severe level.  Sleep is poor at times.  Sleep  is her biggest complaint and has been much worse still since she moved  in with her daughter.  Her days and nights have been mixed up.  She is  up at night quite frequently and then was unable to stay awake during  the day and even when she stays awake during the day occasionally she  cannot sleep at night.  She thinks it is a new environment.  There may  be a little bit of stress involved as well.  Her edema in lower  extremities is better and she is now off the Mobic as well.   REVIEW OF SYSTEMS:  Notable for numbness, tremor, tingling, spasm,  trouble walking, anxiety, night sweats, nausea, constipation, limb  swelling which is improved and shortness of breath to a certain extent.  Full review is in the written health and history section of the chart.   SOCIAL HISTORY:  Noted above.   PHYSICAL EXAMINATION:  VITAL SIGNS:  Blood pressure 156/88, pulse is 95,  respiratory rate 18.  She is sating 93% on room air.  GENERAL:  The patient is pleasant, alert, and oriented x3.  Affect is  generally bright and appropriate.  EXTREMITIES:  Gait is not tested as she is in a wheelchair today.  Legs  display 2+ perhaps pitting edema, but are substantially improved from  last visit.  She has no weeping areas today.  Knees are somewhat tender  to passive and active range of motion.  BACK:  Tender with palpation, more tender also with flexion and  extension today.  HEART:  Regular.  CHEST:  Clear.  ABDOMEN:  Soft, nontender.  She remains morbidly overweight.   ASSESSMENT:  1. Lumbar facet arthropathy with radiculopathy L4-S1 additionally.  2. Osteoarthritis of the knees.  3. Morbid obesity.  4. Bilateral rotator cuff syndrome.  5. Lower extremity edema which is improved.  6. Insomnia.   PLAN:  1. The patient should change her Celexa at nighttime administration.      I also advised her to try to reduce her daytime Xanax perhaps only      taking Xanax 0.5 once or twice a day.  2. I gave her 2 weeks of Lunesta 2 mg to try to help reestablish her      sleep pattern.  She also may want to try some occasional caffeine      during the day when she is awake.  I encouraged activity which is      stimulating for her mind and also for her heart during the day.  3. Hold off on resuming Lasix at this point.  4. Refilled her fentanyl patch 25 mcg q.72 h #10 as well as her      oxycodone 5 mg 1 q. 6 h p.r.n.  5. Nursing will see her back next month.  I will see her back in 3      months.      Ranelle Oyster, M.D.  Electronically Signed     ZTS/MedQ  D:  09/19/2008 12:41:58  T:  09/19/2008 23:52:27  Job #:  [id number]   cc:   Delaney Meigs, M.D.  Fax: [phone]

## 2010-06-22 NOTE — Procedures (Signed)
NAMEJANALEE, [name]                ACCOUNT NO.:  [id number]   MEDICAL RECORD NO.:  [id number]           PATIENT TYPE:   LOCATION:                                 FACILITY:   PHYSICIAN:  Ranelle Oyster, M.D.DATE OF BIRTH:  1955-12-16   DATE OF PROCEDURE:  02/12/2008  DATE OF DISCHARGE:                               OPERATIVE REPORT   PROCEDURE:  Synvisc injection.   DIAGNOSTIC CODE:  715.16 osteoarthritis, right knee.   After informed consent, we prepped the skin with Betadine and then  isopropyl alcohol, injected after aspirating 5 mL of aqueous Synvisc  solution.  The patient tolerated well without any issues.  Area was  cleaned and dressed after injection.  This is the second of three  injections.  She will come back for the third next week.      Ranelle Oyster, M.D.  Electronically Signed     ZTS/MEDQ  D:  02/12/2008 11:14:28  T:  02/13/2008 00:41:54  Job:  [id number]

## 2010-06-22 NOTE — Assessment & Plan Note (Signed)
[name] is back regarding multiple pain complaints.  We had her as a  patient on inpatient rehab in late June or early July.  She was  discharged home with home therapy.  She has had some persistent pain  since going home particularly in the back, right leg, and shoulder.  She  complains of spasms intermittently in the arm and leg.  They seem to  come on at any time.  Her most prominent complaint seems to be burning  and pain in the right knee.  Apparently, she was seen by [facility] in the past and has had a knee injection, as well as an MRI,  not privy to the details of the workup, although she states that she was  told she had osteoarthritis.  Her pain ranges from 6-8/10, currently she  is on scheduled Ultram as well as p.r.n. oxycodone.  She uses Vistaril  for rash that she gets from the Ultram.  Overall, her pain control is  not adequate with the current regimen.  She also uses cyclobenzaprine 10  mg every 8 hours and Mobic 15 mg q.a.m.  Pain is described as sharp,  burning, stabbing, constant aching.  The patient can walk from room to  room only with her walker but not much further.  She has difficulty  standing for any long period of time.  She has to use a walker for any  type of movement.  Pain is worsened with walking along with sitting,  standing, and sometimes bending.  She has pain from the back into the  right leg.  She complains of pain in the knee, radiating from the knee  to the foot with a burning type of quality.  The patient needs  assistance with meal prep, shopping, household duties, as well as  toileting from her husband.   REVIEW OF SYSTEMS:  Notable for bladder control problems, constipation,  spasm, dizziness, depression, anxiety, urine retention, constipation,  weight gain, night sweats, limb swelling, coughing, shortness of breath,  and wheezing.  Limb swelling has been particularly a problem over last  few months.   SOCIAL HISTORY:  The patient is  married and living with her husband.  Husband is still smoking but she is not apparently.   PHYSICAL EXAMINATION:  VITAL SIGNS:  Blood pressure is 115/63, pulse 89,  respiratory rate 20, and she is sating 95% on room air.  GENERAL:  The patient is pleasant, alert, and oriented x3.  She remains  morbidly obese.  We do not have a weight on her, although she is likely  in the high 300s.  She is able to stand with mod assist today and tended  to want to go backwards.  She had pain in the neck as well as knee and  low back with standing.  Right knee was notable for pain with resisted  flexion, extension, as well as meniscal maneuvers, is also painful to  palpation along the peripatellar borders.  It is difficult to assess  swelling due to her weight.  Low back was tender to palpation.  She  tends to stand with a flexed posture.  Shoulder is notable for rotator  cuff impingement signs.  Overall, strength is 3-3+/5 in the lower  extremities and 3-4/5 in the upper extremities today.  HEART:  Regular rate.  CHEST:  Clear.  ABDOMEN:  Soft and nontender.  Cognitively, she is intact and has good  insight and awareness.   ASSESSMENT:  1. Lumbar facet  arthropathy with radiculopathy particularly L4 through      S1, likely on the right.  2. Chronic right knee pain related to osteoarthritis.  3. Right rotator cuff disease/degenerative arthritis of the right      shoulder.  4. Morbid obesity.   PLAN:  1. We will discontinue Vistaril and Ultram.  Begin trial of fentanyl      patch 25 mcg every 72 hours with oxycodone 5 mg every 6 hours      p.r.n. breakthrough pain.  2. We will stop Mobic due to swelling as this is likely contributing.  3. After informed consent, we injected the right knee via lateral      approach with 40 mg of Kenalog and 3.5 mL of 1% lidocaine.  The      patient tolerated well, was experiencing relief when she left the      office.  4. Acquire MRI studies done over the summer.   She would be a candidate      perhaps for epidural steroid injections versus medial branch blocks      but likely would need to lose further weight before she would be      able to sit on the table.  5. We would like to begin exercising and stretching program.  We      discussed appropriate diet today.  6. I will see her back in a month's time.      Ranelle Oyster, M.D.  Electronically Signed     ZTS/MedQ  D:  10/17/2007 10:24:18  T:  10/17/2007 23:45:11  Job #:  [id number]   cc:   Delaney Meigs, M.D.  Fax: [phone]

## 2010-06-22 NOTE — Discharge Summary (Signed)
[name], [name] NO.:  [id number]   MEDICAL RECORD NO.:  [id number]          PATIENT TYPE:  IPS   LOCATION:  4008                         FACILITY:  [facility]   PHYSICIAN:  Ellwood Dense, M.D.   DATE OF BIRTH:  [birth year]   DATE OF ADMISSION:  08/03/2007  DATE OF DISCHARGE:                               DISCHARGE SUMMARY   ADMITTING PHYSICIAN:  Ranelle Oyster, MD   PRIMARY CARE PHYSICIAN:  Delaney Meigs, MD   ORTHOPEDIST:  Jene Every, MD   HISTORY OF PRESENT ILLNESS:  Ms. Mackiewicz is a 55 year old Caucasian  morbidly obese female with history of COPD and low back pain with  radiculopathy of the right lower extremity for few weeks prior to  admission.  She was admitted to [facility] on July 31, 2007 with  increased right leg pain and low back pain.   CT of the lumbar spine showed facet arthritic changes at L4-L5 and L5-S1  with disk flattening, most prominent at L5-S1.  The patient had only  minimal relief with IV inflammatory medications.  Neurology was  consulted and they felt the patient had L5 radiculopathy with pronounced  weakness and started her on physical therapy along with a steroid  Dosepak.  Therapies have been initiated and the patient requires min  assist for transfers and is able to tolerate greater than 30 minutes out  of bed with therapeutic exercises.  She has been able to stand and do  some side stepping.   The patient was evaluated by the rehabilitation physicians and felt to  be an appropriate candidate for inpatient rehabilitation.   REVIEW OF SYSTEMS:  Positive shortness of breath, incontinence, weakness  of the right lower extremity, numbness of the right lower extremity,  insomnia, anxiety, and depression.   PAST MEDICAL HISTORY:  1. Morbid obesity with BMI of 61.2.  2. 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  3. History of decubitus ulcer.  4. Depression.  5. Anxiety.  6. COPD with  asthmatic component.  7. History of shingles.  8. History of recent falls, mid May 2009.  9. Prior hysterectomy 1985 and right salpingo-oophorectomy.  10.Prior laparoscopic cholecystectomy.  11.Gastroesophageal reflux disease.  12.Chronic constipation with 2-3 bowel movements per month.   FAMILY HISTORY:  Positive for COPD, lupus, and congestive heart failure.   SOCIAL HISTORY:  The patient is married and lives in a mobile home  approximately 14 x 70 feet with her husband.  They are 5 steps to enter.  The husband is semi-retired and is a smoker.  The patient herself does  not use alcohol or tobacco.  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  She  has not worked since 2001.   FUNCTIONAL HISTORY PRIOR ADMISSION:  The patient with decreased mobility  since fall in mid May 2009.  She had been using her cane and a rolling  chair and rocks to get out of the chair to get to a standing position.   ALLERGIES:  SULFA causes swelling and shortness of breath.   MEDICATIONS PRIOR TO ADMISSION:  1. Prilosec 20 mg daily.  2. Celexa 40 mg daily.  3. Advair b.i.d.  4. Singular 10 mg daily.  5. Spiriva daily.  6. Premarin 0.625 mg daily.  7. Proventil p.r.n.  8. Clindamycin and doxycycline p.r.n. MRSA eruptions.  9. Mobic 20 mg daily.  10.Xanax 0.5 mg 4 times a day.  11.Aspirin daily.  12.Ultram and Darvocet p.r.n.   LABORATORY DATA:  Recent hemoglobin was 12.7 with hematocrit of 37.7,  platelet count of 334,000, and white count of 8.7.  Recent sodium was  140, potassium 3.9, chloride 105, bicarbonate 27, BUN 17, creatinine  1.0, and glucose of 119.  MRI scan of the lumbar spine in January 2009  showed L3 vertebral body hemangioma with facet arthrosis at L3-L4 and  moderate facet arthrosis at L4-L5 with left disk bulge.  There was also  L5-S1 moderate facet arthropathy.   MRI scan of her right shoulder showed significant  rotator cuff disease  with biceps tendinopathy.   PHYSICAL EXAMINATION:  GENERAL:  Pleasant, morbidly obese adult female  lying in bed in mild to no acute discomfort.  VITAL SIGNS:  Blood pressure was 141/85 with pulse 90, respiratory rate  20, temperature 98.2, weight of 178 kg, and height 5 feet 7 inches.  HEENT:  Normocephalic, nontraumatic.  CARDIOVASCULAR:  Regular rate and rhythm.  S1 and S2 without murmurs.  ABDOMEN:  Soft and nontender with positive bowel sounds, obese.  LUNGS:  Clear to auscultation bilaterally with distant breath sounds  throughout.  NEUROLOGIC:  Alert and oriented x3.  Cranial nerves II-XII were intact.  BILATERAL UPPER EXTREMITIES:  Showed 4-/5 strength in the left upper  extremity and 4/5 strength in the distal right upper extremity with  decreased strength of 3+ in the right shoulder region.  A right lower  extremity exam showed 3/5 strength with decreased sensation throughout.  Bulk was normal and tone was normal in the right lower extremity.  Left  lower extremity exam showed 4-/5 strength with normal bulk and tone and  normal sensation.  SKIN:  Showed some petechial type lesions measuring 1-2 mm of her  abdomen and two in number with small similar lesion of her right arm  measuring 2-3 in quantity.   DIAGNOSES:  1. L4-L5 and L5-S1 facet arthropathy with possible L5 radiculopathy      with right lower extremity weakness.  2. Morbid obesity with BMI of 61.2.  3. Prior rotator cuff pathology affecting the proximal right upper      extremity weakness.  4. Depression.  5. History of anxiety disorder.  6. Chronic obstructive pulmonary disease.  7. Chronic constipation.  8. Methicillin-resistant Staphylococcus aureus colonization.  9. Hypertension.   Presently, the patient has deficits in ADLs, transfers, and ambulation  related to the above-noted right lower extremity weakness and lumbar  arthropathy/radiculopathy.   PLAN:  1. Admit to the  rehabilitation with daily therapies to include      physical therapy for range of motion, strengthening, bed mobility,      transfers, pre-gait training, gait training, and equipment.  2. Occupational therapy for range of motion, strengthening, ADLs,      cognitive/perceptual training, splinting, and equipment.  3. Rehab nursing for skin care, wound care, and bowel and bladder      training as necessary.  4. Case management to assess home environment, assist with discharge      planning, and  arrange for appropriate followup care.  5. Social work to assess family and social support and assist in      discharge planning.  6. Continue regular diet.  7. Check admission labs including CBC and CMET in a.m. August 06, 2007.  8. Oxycodone 5 mg 1-2 tablets p.o. q.4 h. p.r.n. pain.  9. Celexa 40 mg p.o. daily.  10.Advair 50/500 one puff daily.  11.Spiriva 1 inhalation daily.  12.Singulair 10 mg p.o. daily.  13.Atacand 16 mg p.o. daily.  14.Flexeril 10 mg p.o. t.i.d.  15.Foley to straight drainage.  16.Ice to the right shoulder t.i.d. and p.r.n.  17.Premarin 0.625 mg p.o. daily.  18.Mobic 20 mg p.o. daily.  19.Xanax 0.5 mg p.o. 4 times a day.  20.Protonix 40 mg p.o. daily.  21.Contact precautions for history of MRSA colonization.  22.Lovenox per pharmacy.  23.Medrol Dosepak double strength to complete 10-day course.  24.Proventil 2 puffs 4 times a day p.r.n.  25.Nystatin powder to inguinal area 4 times a day and p.r.n.  26.Albuterol nebulizer 4 times a day p.r.n. shortness of breath.  27.Senokot-S 2 tablets p.o. at bedtime.   PROGNOSIS:  Fair.   ESTIMATED LENGTH OF STAY:  5-10 days.   GOALS:  Modified independent ADLs and transfers and standby assist-to-  min assist ambulation household distances with modified independent  wheelchair mobility.           ______________________________  Ellwood Dense, M.D.     DC/MEDQ  D:  08/03/2007  T:  08/04/2007  Job:  [id number]

## 2010-06-22 NOTE — Procedures (Signed)
NAMETRENYCE, LOERA                ACCOUNT NO.:  [id number]   MEDICAL RECORD NO.:  [id number]           PATIENT TYPE:   LOCATION:                                 FACILITY:   PHYSICIAN:  Ranelle Oyster, M.D.DATE OF BIRTH:  [birth year]   DATE OF PROCEDURE:  02/04/2008  DATE OF DISCHARGE:                               OPERATIVE REPORT   PROCEDURES:  Synvisc injection.   DIAGNOSTIC CODE:  1015.16; osteoarthritis, right knee.   After informed consent, we prepped the anterior knee with Betadine.  After needle aspiration, we injected via the anterolateral approach 5 mL  of aqueous Synvisc solution.  The patient tolerated without any ill  effects.  Area was cleaned and bandaged.  She will return in  approximately 9-10 days for second of 3 injections.  She was given post  injection instructions today.      Ranelle Oyster, M.D.  Electronically Signed     ZTS/MEDQ  D:  02/04/2008 16:02:33  T:  02/05/2008 02:34:03  Job:  [id number]

## 2010-06-22 NOTE — Procedures (Signed)
NAMEKUULEI, KLEIER                ACCOUNT NO.:  [id number]   MEDICAL RECORD NO.:  [id number]           PATIENT TYPE:   LOCATION:                                 FACILITY:   PHYSICIAN:  Ranelle Oyster, M.D.DATE OF BIRTH:  05-Apr-1955   DATE OF PROCEDURE:  02/18/2008  DATE OF DISCHARGE:                               OPERATIVE REPORT   PROCEDURE:  Synvisc injection, osteoarthritis of the right knee, 715.16.   DESCRIPTION OF PROCEDURE:  This is the third of 3 injections.  After  informed consent, we injected the left knee first, bilateral then.  After unsuccessful entry, changed to the medial entry point using 5 mL  of aqueous Synvisc solution.  We prepped the areas with Betadine.  The  patient tolerated it fairly well without substantial bleeding.  She was  given postinjection instructions.   I will see her back in the office here in about 2 months.  She will  follow up with nursing in 1 month's time.  She was given refills on her  fentanyl 25 mcg patch #10 and oxycodone 5 mg q.6 h. p.r.n. #90 for  breakthrough pain.      Ranelle Oyster, M.D.  Electronically Signed     ZTS/MEDQ  D:  02/18/2008 11:27:31  T:  02/19/2008 02:07:48  Job:  [id number]   cc:   Delaney Meigs, M.D.  Fax: [phone]

## 2010-06-22 NOTE — Assessment & Plan Note (Signed)
[name] is back regarding her back and shoulder as well as the right knee  complaints.  She states that the fentanyl patch has helped her with pain  in general.  She is getting more rest.  The right knee injection was  very helpful for a few weeks as well.  She was able to get up and do  more on the kitchen, etc.  She just gave me over a recent flu-like  illness and has been quite weakened by that, but feels that she is going  to recover.  I looked at the MRI of her right shoulder and low back.  The right shoulder is notable for multiple rotator cuff tendinopathy and  tears.  She also has bursitis in the subacromial subdeltoid areas and  biceps tendinopathy.  Low back MRI was notable for facet arthrosis,  particularly at L4-L5 and L3-L4 as well, additionally L5-S1.  She also  has degenerative disk disease at L4-L5, which was called mild with  lateral disk bulge.  The patient states that pain is 7/10.  It is  intermittent, stabbing, tingling, and aching.  Pain interferes with  general activity, in relationship with others, and enjoyment of life on  a moderate-to-severe level.  Sleep is fair.  She states that she lost  some weight, but does not know how much as of yet.  She does feel that  her clothes are fitting better, etc.   REVIEW OF SYSTEMS:  Notable for the above.  She does report some  depression, anxiety, skin rash, surrounding her illness constipation,  limb swelling, coughing, and wheezing.  Full review is in the written  health and history section of the chart.   SOCIAL HISTORY:  The patient is married and living with her husband.   PHYSICAL EXAMINATION:  VITAL SIGNS:  Blood pressure is 130/58, pulse is  99, respiratory rate 18, and she is sating 96% on room air.  GENERAL:  The patient is pleasant, alert, and oriented x3.  Her weight  does not appear to have changed traumatically on Myoview.  Knees seem to  be less tender with resistant flexion, extension, and passive movements  today.  She has some pain in the right shoulder with rotation, was able  to actively move it and gets gravity in most of the planes.  Low back is  tender to palpation and movement today.  Strength is 3-3+ still on the  lower extremities and 3+-4 in her upper extremities today, left to right  shoulder.  HEART:  Regular.  CHEST:  Clear.  ABDOMEN:  Soft and nontender.  She had some scabs and old healing rash  along the abdomen area, which do not appear active in nature.   ASSESSMENT:  1. Lumbar facet arthropathy with radiculopathy, particularly in the      L4, L5, and S1 areas on the right.  2. Lumbar degenerative disk disease.  3. Osteoarthritis right knee.  4. Right rotator cuff tear/syndrome and degenerative arthritis of the      shoulder.  5. Morbid obesity.   PLAN:  1. Encourage weight loss and exercise.  2. Injected the right knee with 40 mg of Kenalog and 3 mL of 1%      lidocaine to help improve weightbearing and activity.  3. Stay with fentanyl 25 mcg every 72 hours with oxycodone 5 mg every      6 hours p.r.n. breakthrough pain.  4. Flexeril 10 mg every 8 hours p.r.n. spasms.  5. Again, once  the weight is down a bit more, we can look at lumbar      spine intervention, but at this point she is      not a candidate.  6. We see her back in 1 month with nurse clinic and 3 months with me.      Ranelle Oyster, M.D.  Electronically Signed     ZTS/MedQ  D:  11/14/2007 13:18:30  T:  11/15/2007 02:21:14  Job #:  [id number]   cc:   Delaney Meigs, M.D.  Fax: [phone]

## 2010-06-22 NOTE — Procedures (Signed)
[name], [name]                ACCOUNT NO.:  [id number]   MEDICAL RECORD NO.:  [id number]          PATIENT TYPE:  REC   LOCATION:  TPC                          FACILITY:  [facility]   PHYSICIAN:  Ranelle Oyster, M.D.DATE OF BIRTH:  [birth year]   DATE OF PROCEDURE:  07/28/2008  DATE OF DISCHARGE:                               OPERATIVE REPORT   PROCEDURE:  Medial joint injection, diagnostic code 715.16  osteoarthritis right knee.   After informed consent, we sterilized the knee with Betadine and  injected 4 mL of 1% lidocaine and 60 mg of Kenalog.  We had lateral  approach.  Aspiration was utilized prior to injection.  The patient  tolerated without issue.  The area was cleaned and dressed and the  patient was given instructions.  I will see her back per her prior  schedule.      Ranelle Oyster, M.D.  Electronically Signed     ZTS/MEDQ  D:  07/28/2008 09:18:33  T:  07/28/2008 23:36:38  Job:  [id number]

## 2010-06-22 NOTE — Assessment & Plan Note (Signed)
[name] is back regarding her chronic pain complaints.  She did well with  the Synvisc injections to her right knee.  She did have a fall about a  month ago on her bathroom when the walker slipped out and she had some  temporary right knee pain, but this has improved.  Her shoulders have  been bothering her.  Dr. Lysbeth Galas placed her on Mobic 7.5 q.12 h.  She  remains on the fentanyl patch 25 mcg as well as oxycodone 5 mg q.6 h.  p.r.n. for pain control.  She rates her pain in 8/10, described as  burning, constantly tingling and aching.  Pain interferes with general  activity, relations with others, and enjoyment of life on a moderate-to-  severe level.  Sleep is fair.   REVIEW OF SYSTEMS:  Notable for pain in the shoulder as well as some  burning and tingling into the hands.  She reports occasional spasms,  anxiety, constipation, night sweats, weakness, limb swelling, coughing,  and occasional shortness of breath.  Full review is in the written  health and history section of the chart.   SOCIAL HISTORY:  The patient lives with her husband.   PHYSICAL EXAMINATION:  VITAL SIGNS:  Blood pressure is 141/96, pulse 89,  respiratory rate 20, and she is sating 94% on room air.  GENERAL:  The patient is generally pleasant and alert.  She appears to  lost some weight.  She tells that she has lost 11 pounds.  EXTREMITIES:  Her bilateral shoulders are painful with rotator cuff  impingement maneuvers.  She has mild pain with resisted abduction of the  shoulders.  Shoulders are painful to touch around the subdeltoid bursa  area as well.  Strength generally 5/5 both upper extremities with 2+  reflexes and normal sensation today.  Knees are painful with resisted  flexion more than extension, but improved from last visit.  There was  decreased swelling as a whole.  Low back is somewhat tender to palpation  and flexion.  However, exam is limited today as she was in a wheelchair.  HEART:  Regular.  CHEST:   Clear.  ABDOMEN:  Soft and nontender.   ASSESSMENT:  1. Lumbar facet arthropathy, radiculopathy. L4-S1 as well on the      right.  2. Osteoarthritis of the right knee.  3. Bilateral rotator cuff syndrome/degenerative arthritis.  4. Morbid obesity.   PLAN:  1. I encouraged weight loss and exercise.  Discussed the fact that we      could pursue back injections if she loses further weight.  She also      could start.  2. Continue Mobic in short term per Dr. Lysbeth Galas as the patient had      swelling with this previously.  3. After informed consent, we injected bilateral shoulders via lateral      approach 40 mg of methylprednisolone and 3 mL of 1% lidocaine.  The      patient tolerating well.  4. Discuss gentle range of motion for shoulders for now, once pain      subsided and increase to light resistance      exercises going forward.  5. I will see her back in about two months' time with Nurse Clinic      followup in 1 month.       Ranelle Oyster, M.D.  Electronically Signed     ZTS/MedQ  D:  04/21/2008 12:37:49  T:  04/22/2008 03:03:09  Job #:  [id number]   cc:   Delaney Meigs, M.D.  Fax: [phone]

## 2010-06-22 NOTE — Assessment & Plan Note (Signed)
[name] is back regarding her chronic pain.  Back has been holding fairly  steady.  She has had good results of the shoulder injections.  Her  biggest complaint is fluid and blisters on her legs, which have been  weeping.  They have been weeping for the last couple of weeks now.  She  had been on her Mobic until a couple weeks ago when it ran out.  She is  taking some fluid pills that she has at home, which amounts to 20 mg of  Lasix daily.  Her other medications have changed.  She denies any  shortness of breath or other symptoms.  There has been a little bit of  redness, but no warmth or any significant signs of infection.   Pain is 7-8/10 today and she describes it as aching and constant.  Pain  interferes with general activity, relations with others, and enjoyment  of life on a moderate-to-severe level.  Sleep is fair.  She notes more  tremors particularly at nighttime.  She uses her Neurontin usually twice  a day.   REVIEW OF SYSTEMS:  Notable for multiple issues noted above.  She also  reports depression, anxiety with occasional constipation, coughing, and  wheezing.  Full review is in the written health and history section of  the chart.   SOCIAL HISTORY:  The patient is married and living with daughter.   PHYSICAL EXAMINATION:  VITAL SIGNS:  Blood pressure is 177/98, pulse is  87, and respiratory rate is 20.  She is sating 97% on room air.  GENERAL:  The patient is pleasant, alert, and oriented x3.  EXTREMITIES:  She use all 4 extremities.  She remains morbidly obese.  Both lower extremities display 3+ edema in multiple areas of weeping  particularly in the right lower extremity, upon examination today.  She  has several open lesions where fluid has come forth.  Areas are slightly  reddened, but do not appear irritated or infected in my opinion.  She  has no substantial pain complaints in the lower extremities above her  baseline.  Both knees are somewhat tender.  She has fair  shoulder range  of motion with some pain still in rotator cuff positions.  BACK:  Low back is somewhat tender to palpation still and more painful  with flexion than extension.  She was in a wheelchair today.  HEART:  Regular rate.  CHEST:  Clear.  ABDOMEN:  Soft, nontender.   ASSESSMENT:  1. Lumbar facet arthropathy and radiculopathy L4-S1.  2. Osteoarthritis of the knees.  3. Bilateral rotator cuff syndrome/osteoarthritis.  4. Morbid obesity.  5. Lower extremity edema with weeping areas.   PLAN:  1. Her edema and drainage is likely related to her Mobic.  It does not      appear that there is an infection at this point.  We increased her      Lasix to 20 mg t.i.d. for 2 days then b.i.d.  I added K-Dur      supplementation 20 mg daily.  She needs to follow up with Dr.      Lysbeth Galas regarding her swelling and further management.  2. I refilled her fentanyl patch 25 mcg q.72 h. #10 and her oxycodone      5 mg 1 q.6 h. p.r.n. #90.  3. We will change her Neurontin, so that she takes 2 at bedtime for      nighttime tremor.  She will take 1 pill in the  morning.  4. We will see her back in 1 month with nursing clinic and 3 months      with me.      Ranelle Oyster, M.D.  Electronically Signed     ZTS/MedQ  D:  06/24/2008 09:53:08  T:  06/24/2008 23:59:46  Job #:  [id number]   cc:   Delaney Meigs, M.D.  Fax: [phone]

## 2010-06-24 NOTE — Discharge Summary (Signed)
NAMEADEL, Townsend               ACCOUNT NO.:  [id number]  MEDICAL RECORD NO.:  [id number]           PATIENT TYPE:  I  LOCATION:  1303                         FACILITY:  [facility]  PHYSICIAN:  Donna Ranger, MD       DATE OF BIRTH:  08/05/1955  DATE OF ADMISSION:  06/13/2010 DATE OF DISCHARGE:  06/16/2010                        DISCHARGE SUMMARY - REFERRING   PRIMARY CARE PHYSICIAN:  [name], M.D.  DISCHARGE DIAGNOSES: 1. Infected right foot ulcer. 2. Gastroesophageal reflux disease. 3. Morbid obesity. 4. Neuropathy. 5. Hypertension. 6. Asthma. 7. History of depression and anxiety. 8. Protein calorie malnutrition. 9. Chronic back pain.  DISCHARGE MEDICATIONS: 1. Doxycycline 100 mg p.o. b.i.d. for 14 days. 2. Ciprofloxacin 500 mg p.o. b.i.d. for 14 days. 3. Advair Diskus 250/50 one puff inhaled b.i.d. 4. Celexa 40 mg p.o. daily at bedtime. 5. Fentanyl patch 50 mcg per hour transdermal q.72 h 6. Lexapro 10 mg p.o. t.i.d. 7. Gabapentin 300 mg p.o. t.i.d. 8. Losartan 50 mg p.o. daily. 9. Omeprazole 20 mg p.o. b.i.d. 10.Oxycodone 10 mg p.o. t.i.d. 11.Premarin 0.625 mg 1 tablet p.o. daily. 12.Proventil inhaler 1 to 2 puffs inhaled every 4 hours as needed for     shortness of breath. 13.Singulair 10 mg p.o. daily. 14.Spiriva 18 mcg inhaled daily.  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  Per the H and P, the patient stated that her problems apparently started in December of 2011 when she sustained injury to her right feet on her second and third toes.  The patient did not notice that because of the neuropathy and poor sensation in the foot.  It became infected and was treated.  She was admitted in February with cellulitis.  She bumped into the leg pain about 2 weeks ago prior to this admission and since  then she was feeling some pain now in the area and also noted to have some maggots.  For details, please refer to the admission note dictated by Dr. Lonia Townsend.  RADIOLOGY AND LABORATORY DATA:  Radiological data:  Second right toe, Jun 13, 2010, soft tissue swelling, no acute osseous findings.  CT of the right foot with contrast on  Jun 15, 2010, showed no specific findings of osteomyelitis, no drainable abscess identified.  Soft tissue swelling around the second toe nail bed.  Subcutaneous edema along the dorsum of the foot laterally and overlying the malleoli.  BMET at the time of discharge; sodium 137, potassium 3.8, BUN 10, creatinine 0.9.  CBC showed white count of 9.2, hemoglobin 12, hematocrit 38.8, platelets 305.  Townsend cultures remained negative till date.  MRSA PCR screening was positive.  BRIEF HOSPITALIZATION: 1. Infected right foot ulcer.  The patient was admitted to the     Medicine Service and was started on IV antibiotics and wound care     consult was also obtained.  Dressing changes recommendation have     been placed on the discharge instructions.  CT scan of the right  foot was obtained which was negative for any osteomyelitis.  The     patient's right second toe wound looks significantly improved at     the time of discharge.  She will continue doxycycline and     ciprofloxacin for 2 weeks and the patient will have home RN     arranged for the dressing changes and wound care. 2. GERD.  The patient was continued on PPIs. 3. Morbid obesity.  The patient is currently on wheelchair and     recounseling was done as well. 4. Neuropathy from chronic back pain and lumbosacral disease.  She was     continued on Neurontin.  The patient was discharged home in good condition.  Home PT/OT was also arranged.  Discharge instructions for the good hygiene and wound care were also explained in great detail to the patient.  DISCHARGE PHYSICAL EXAMINATION:  VITAL SIGNS:  Vital  signs at the time of discharge, temperature 98.2, pulse 80, respirations 20, BP 163/99, O2 sats 95% on room air. GENERAL:  The patient is alert, awake and oriented x3, not in acute distress. HEENT:  Anicteric sclerae.  Pink conjunctivae.  Pupils reactive to light and accommodation. NECK:  Supple.  No thyromegaly, no JVD. CVS:  S1, S2 clear.  Regular rate and rhythm. CHEST:  Clear to auscultation bilaterally. ABDOMEN:  Soft, nontender, nondistended.  Normal bowel sounds. EXTREMITIES:  Right lower extremity, wound between the second and third toe and second toe nail bed significantly improved.  No draining pus noted at the time of discharge.  DISCHARGE FOLLOWUP:  With Dr. Lysbeth Townsend within next 2 weeks.  Discharge time, 35 minutes.     Donna Ranger, MD     RR/MEDQ  D:  06/16/2010  T:  06/16/2010  Job:  [id number]  cc:   [name], M.D. Fax: [phone]  Electronically Signed by [name]  on 06/24/2010 01:27:21 PM

## 2010-06-25 NOTE — Op Note (Signed)
[name], [name]                ACCOUNT NO.:  [id number]   MEDICAL RECORD NO.:  [id number]           PATIENT TYPE:  INP   LOCATION:  A312                          FACILITY:  [facility]   PHYSICIAN:  Dalia Heading, M.D.  DATE OF BIRTH:  06/26/1955   DATE OF PROCEDURE:  09/26/2005  DATE OF DISCHARGE:                                 OPERATIVE REPORT   PREOPERATIVE DIAGNOSES:  Cholecystitis, cholelithiasis.   POSTOPERATIVE DIAGNOSES:  Cholecystitis, cholelithiasis,  choledocholithiasis.   PROCEDURE PERFORMED:  Laparoscopic cholecystectomy with cholangiograms.   SURGEON:  Dalia Heading, M.D.   ANESTHESIA:  General endotracheal.   INDICATIONS:  The patient is a 55 year old white female who presents with  cholecystitis secondary to cholelithiasis.  The risks and benefits of the  procedure including bleeding, infection, hepatobiliary injury and the  possibility of an open procedure were fully explained to the patient who  gave informed consent.   PROCEDURE NOTE:  The patient was placed in the supine position.  After  induction of general endotracheal anesthesia, the abdomen was prepped and  draped using the usual sterile technique with Betadine.  Surgical site  confirmation was performed.   A supraumbilical incision was made down to the fascia.  Veress needle was  introduced into the abdominal cavity and confirmation of placement was done  using the saline drop test.  The abdomen was then insufflated to 16 mmHg  pressure.  An 11-mm trocar was introduced into the abdominal cavity under  direct visualization without difficulty.  The patient was placed in reverse  Trendelenburg position.  An additional 11-mm trocar was placed in the  epigastric region.  A 5-mm trocar was placed in the right upper quadrant,  right flank regions.  Additionally, an 11-mm trocar was placed in the left  upper quadrant region in order to facilitate pedal displacement of the  intestines from the infundibulum  of the gallbladder.  The cystic duct of the  gallbladder was retracted superolaterally.  The dissection was begun around  the infundibulum of the gallbladder.  The cystic duct was first identified.  The juncture to the infundibulum was identified.  A single Endoclip was  placed proximally on the cystic duct, and an incision was made into the  cystic duct and then cholangiocatheter was inserted.  The patient was noted  to have a distal common bile duct obstruction.  No dye flowed into the  duodenum.  The cystic duct was noted to be long and corkscrew in nature.  Despite multiple attempts at passing the guidewire, balloon and flushing the  system with saline, final cholangiograms done under digital fluoroscopy  revealed obstruction of the distal common bile duct.  The hepatobiliary tree  was also noted to be dilated.  The cholangiocatheter was removed.  Multiple  endoclips were placed distally on the cystic duct, and the cystic duct was  divided.  The cystic artery was likewise ligated and divided.  The  gallbladder was then freed away from the gallbladder fossa using Bovie  electrocautery.  The gallbladder was delivered through the epigastric trocar  site using an  EndoCatch bag.  The gallbladder fossa was inspected and no  abnormal bleeding or bile leakage was noted.  Surgicel was placed in the  gallbladder fossa.  All fluid was evacuated from the abdominal cavity prior  to removal of the trocars.   All wounds were irrigated with normal saline.  All wounds were injected with  0.5% Sensorcaine.  The supraumbilical fascia was reapproximated using an 0  Vicryl interrupted suture.  All skin incisions were closed using staples.  Betadine ointment and dry sterile dressings were applied.   All __________  and needle counts were correct at the end of the procedure.  The patient was extubated in the operating room and back to the recovery  room awake in stable condition.   COMPLICATIONS:   None.   SPECIMENS:  Gallbladder.   BLOOD LOSS:  Minimal.      Dalia Heading, M.D.  Electronically Signed     MAJ/MEDQ  D:  09/26/2005  T:  09/27/2005  Job:  [id number]

## 2010-06-25 NOTE — H&P (Signed)
[name], [name]                ACCOUNT NO.:  [id number]   MEDICAL RECORD NO.:  [id number]          PATIENT TYPE:  INP   LOCATION:  A312                          FACILITY:  [facility]   PHYSICIAN:  Dalia Heading, M.D.  DATE OF BIRTH:  06-Feb-1956   DATE OF ADMISSION:  09/25/2005  DATE OF DISCHARGE:  LH                                HISTORY & PHYSICAL   CHIEF COMPLAINT:  Cholecystitis, cholelithiasis.   HISTORY OF PRESENT ILLNESS:  The patient is a 55 year old morbidly obese  white female who presents with worsening right upper quadrant abdominal  pain, nausea, vomiting.  She was diagnosed in the past with cholelithiasis.  She presents back to the emergency room with worsening symptoms.   PAST MEDICAL HISTORY:  Includes:  1. Morbid obesity.  2. COPD.  3. Hypertension.   PAST SURGICAL HISTORY:  Unremarkable   CURRENT MEDICATIONS:  Prevacid, Flovent, Celexa, Serevent, Singulair,  Atrovent, Premarin, hydrochlorothiazide, baby aspirin.   ALLERGIES:  SULFA.   REVIEW OF SYSTEMS:  The patient denies smoking.  She denies any recent acute  asthma attack.   PHYSICAL EXAMINATION:  GENERAL:  The patient is a morbidly obese white  female in no acute distress.  VITAL SIGNS: She is afebrile.  Vital signs are stable.  HEENT: Examination reveals no scleral icterus.  LUNGS:  Minimal expiratory wheezing is noted.  HEART: Examination reveals regular rate and rhythm without S3, S4, or  murmurs.  ABDOMEN:  The abdomen is soft with tenderness in the right upper quadrant to  palpation.  No hepatosplenomegaly, masses, or hernias are identified.   White blood cell count 9.8, hematocrit 42, platelet count 355. Urine  pregnancy test is negative.  Urinalysis is unremarkable.  MET-7 is  unremarkable.  Lipase is 26.  The liver enzyme tests revealed total  bilirubin of 1.8 with a direct of 0.8, alkaline phosphatase 101, SGOT 306,  SGPT 148.   Ultrasound of the gallbladder reveals cholelithiasis with a  thickened  gallbladder wall; common bile duct at that time was 5 mm and that was  earlier in August 2007.   IMPRESSION:  Cholecystitis, cholelithiasis.   PLAN:  1. The patient will be admitted to the hospital for further evaluation and      treatment.  2. She will be started on Atrovent inhaler therapy.  3. She will also be started intravenous antibiotics.  4. She subsequently will undergo laparoscopic cholecystectomy with      cholangiograms.  Risks and benefits of the procedure including      bleeding, infection, hepatobiliary injury, the possibly of an open      procedure were fully explained to the patient, gave informed consent.      Dalia Heading, M.D.  Electronically Signed     MAJ/MEDQ  D:  09/25/2005  T:  09/25/2005  Job:  [id number]   cc:   [facility] Department

## 2010-07-08 NOTE — H&P (Signed)
[name], [name]               ACCOUNT NO.:  [id number]  MEDICAL RECORD NO.:  [id number]           PATIENT TYPE:  I  LOCATION:  1303                         FACILITY:  [facility]  PHYSICIAN:  Lonia Blood, M.D.      DATE OF BIRTH:  10-11-1955  DATE OF ADMISSION:  06/13/2010 DATE OF DISCHARGE:                             HISTORY & PHYSICAL   PRIMARY CARE PHYSICIAN:  Delaney Meigs, MD  PRESENTING COMPLAINT:  Right lower extremity ulcer with maggots.  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  Her problem apparently started in December of 2011, when she sustained injury to her right feet on her second and third toes.  The patient did not even notice it because of neuropathy and poor sensation in the foot.  It became infected, untreated.  She was actually admitted here in February with cellulitis.  The patient bumped into the leg again about 2 weeks ago. Since then, she has been feeling some pain now in the area and then was noted to have some maggots when I saw her.  In December when she first hurt herself, she actually pulled out a nail from the second toe on the right,  that is all the beginning of the problem.  A week ago when she injured it, she tried to dress it with bandage.  Today, they took the dressing off and they saw maggots in it.  She, therefore, decided to come in for further treatment.  She denied any further injury since last week.  She is not a diabetic and no other infections anywhere else.  She has very little sensation, so she denied any pain at this point.  PAST MEDICAL HISTORY:  Significant for asthma, COPD, history of the right lower extremity cellulitis, history of recent bronchopneumonia in February 2012, COPD, hypertension, morbid obesity,  chronic low back pain with sciatica and lumbago, depression, anxiety, osteoarthritis especially in the right knee.  She is status post cholecystectomy and status post total hysterectomy.  ALLERGIES:  She is allergic to SULFA.  CURRENT MEDICATIONS:  Include, 1. Celexa 40 mg daily. 2. Fentanyl patch 50 mcg every 3 days. 3. Flexeril 10 mg 3 times a day. 4. Neurontin 300 mg t.i.d. 5. Losartan 50 mg daily. 6. Omeprazole 20 mg daily. 7. Premarin 0.625 mg daily. 8. Also Singulair 10 mg daily. 9. Occasional oxycodone as needed.  SOCIAL HISTORY:  [place]  She goes to Dr. Dorcas Carrow here for pain control of her lower back.  She denied tobacco, alcohol, or IV drug use.  She lives with her daughter who is here with her.  FAMILY HISTORY:  Significant for mother and father both having heart disease.  REVIEW OF SYSTEMS:  All systems reviewed are negative except per HPI. Although the patient is concerned about her bladder, she has poor control occasionally.  PHYSICAL EXAMINATION:  VITAL SIGNS:  On exam, temperature 98, pressure 139/81,  her pulse is 79, respiratory rate 20, sats 98% on room air. GENERAL:  She is awake, alert, oriented.  She is in no acute distress. HEENT:  PERRL.  EOMI.  No pallor, no jaundice.  No rhinorrhea. NECK:  Supple.  No JVD, no lymphadenopathy. RESPIRATORY:  She has good air entry bilaterally.  No wheezes, no rales, no crackles. CARDIOVASCULAR SYSTEM:  She has S1 and S2.  No audible murmur. ABDOMEN:  Soft, full, nontender with positive bowel sounds. EXTREMITIES:  Right lower extremity swollen, tender with pus coming out between the second and third toes with some maggots.  Also very foul- smelling discharge. SKIN EXAM:  No other rashes.  No other ulcers.  LABORATORY DATA:  Lipase is 25.  Sodium is 139, potassium 3.7, chloride 100, CO2 of 31, glucose 103, BUN 12, creatinine 0.97.  She had an albumin of 2.9 and calcium 8.7.   White count is 9.5, hemoglobin 12.4 and platelet count of 344.  X-ray of her foot shows soft tissue swelling, no acute osseous findings by plain radiography.  ASSESSMENT:  This is a 55 year old female presenting with infected right second toe ulcer with some maggots in it.  Her x-ray showed no evidence of osteomyelitis.  It appears that this is poor wound care at home.  The patient is not a diabetic, but she has neuropathy to the foot.  PLAN: 1. Infected right foot ulcer.  We will admit the patient for IV     antibiotics and wound care.  I will start her on some vanco and     Zosyn for now, get wound cultures, clean the wound.  Once that is     stabilized, the patient may be referred to some home wound care     with some wound care center. 2. GERD.  Continue with PPIs. 3. Morbid obesity.  Her BMI is probably at least 50 and that is the     reason for possible problem with her foot to the way it is right     now.  So, we may have to consider some type of wheelchair when she     leaves the hospital. 4. Neuropathy from her chronic back pain that has been addressed at     home.  She is also on Neurontin.  Will continue with that here. 5. Hypertension.  Continue with her home meds. 6. Asthma.  I will give her some empiric nebulizers in the hospital     p.r.n. 7. Depression and anxiety.  Again continue with her Celexa from home. 8. Bladder instability.  I will put a Foley in the patient and watch     her closely.  If needed, we may need to start her on something for     her bladder. 9. Protein-calorie malnutrition.  Her albumin of 3 indicates protein-     calorie malnutrition.  We will also watch her very closely and see     if we need to do anything significant.  Other than that, the     patient seems stable for admission.     Lonia Blood, M.D.     Verlin Grills  D:  06/13/2010  T:  06/13/2010  Job:  [id number]  Electronically Signed by Lonia Blood M.D. on 07/08/2010 11:43:26 AM

## 2010-07-14 ENCOUNTER — Encounter: Payer: Medicare Other | Attending: Physical Medicine & Rehabilitation | Admitting: Physical Medicine & Rehabilitation

## 2010-07-14 DIAGNOSIS — M1710 Unilateral primary osteoarthritis, unspecified knee: Secondary | ICD-10-CM

## 2010-07-14 DIAGNOSIS — IMO0002 Reserved for concepts with insufficient information to code with codable children: Secondary | ICD-10-CM

## 2010-07-14 DIAGNOSIS — M129 Arthropathy, unspecified: Secondary | ICD-10-CM | POA: Insufficient documentation

## 2010-07-14 DIAGNOSIS — E669 Obesity, unspecified: Secondary | ICD-10-CM

## 2010-07-14 DIAGNOSIS — M545 Low back pain, unspecified: Secondary | ICD-10-CM | POA: Insufficient documentation

## 2010-07-14 DIAGNOSIS — M25569 Pain in unspecified knee: Secondary | ICD-10-CM | POA: Insufficient documentation

## 2010-07-14 NOTE — Assessment & Plan Note (Signed)
[name] is back regarding her low back and knee pain.  She was in the hospital for cellulitis or infection in the right second toe.  She states that since I last saw her, she has lost about 30 pounds, probably this is related to her hospital stay unfortunately.  Pain is about 7- 8/10, described as dull and stabbing.  Pain interferes with general activity, in relations with others, enjoyment of life on a moderate-to- severe level.  Sleep is fair.  She asked me today about her possibilities in getting knee replacements.  REVIEW OF SYSTEMS:  Notable for the above.  She does report some numbness, trouble walking, tingling.  Full 12-point review is in the written health and history section of the chart.  SOCIAL HISTORY:  Unchanged.  She is living with her daughter, is with her today and seems very supportive.  Her daughter is helping her with dietary changes.  PHYSICAL EXAMINATION:  VITAL SIGNS:  Blood pressure 137/62, pulse 89, respiratory rate 18, she is satting 96% on room air. GENERAL:  Foot seems to be healed.  She remains morbidly obese.  She has crepitus in both knees and pain with resisted and active unresisted movements.  She appears to have lost some weight, but is not traumatic in appearance.  She states her weight is down to 375.  ASSESSMENT: 1. Lumbar facet arthropathy with radiculopathy. 2. Osteoarthritis of bilateral knees status post right knee trauma. 3. Morbid obesity.  PLAN: 1. I would be happy to make a referral ultimately to Orthopedic     Surgery, although she is not a candidate for such surgery until her     weight drops more.  She will need to continue working on diet.  We     discussed lot of dietary items today.  She is still drinking a lot     of sweet tea and eating ice cream and other sweet foods.  She needs     to work on better dietary control.  I think she would benefit from     another dietary consult perhaps.  The daughter is there and is     helping  her with her intake and will work together and try to     further improve her patterns. 2. Refilled her oxycodone 10 mg 1 q.6 hours p.r.n. #90 and fentanyl     patch 50 mcg 1 q.72 hours #10.     Ranelle Oyster, M.D. Electronically Signed    ZTS/MedQ D:  07/14/2010 14:14:55  T:  07/14/2010 23:41:31  Job #:  [id number]

## 2010-08-10 ENCOUNTER — Encounter: Payer: Medicare Other | Attending: Neurosurgery | Admitting: Neurosurgery

## 2010-08-10 DIAGNOSIS — M25569 Pain in unspecified knee: Secondary | ICD-10-CM | POA: Insufficient documentation

## 2010-08-10 DIAGNOSIS — M5380 Other specified dorsopathies, site unspecified: Secondary | ICD-10-CM

## 2010-08-10 DIAGNOSIS — R209 Unspecified disturbances of skin sensation: Secondary | ICD-10-CM | POA: Insufficient documentation

## 2010-08-10 DIAGNOSIS — M129 Arthropathy, unspecified: Secondary | ICD-10-CM | POA: Insufficient documentation

## 2010-08-10 DIAGNOSIS — M1710 Unilateral primary osteoarthritis, unspecified knee: Secondary | ICD-10-CM

## 2010-08-10 DIAGNOSIS — M545 Low back pain, unspecified: Secondary | ICD-10-CM | POA: Insufficient documentation

## 2010-08-10 DIAGNOSIS — M62838 Other muscle spasm: Secondary | ICD-10-CM | POA: Insufficient documentation

## 2010-08-11 NOTE — Assessment & Plan Note (Signed)
Account [id number].  This is a patient of Dr. Riley Kill.  She saw last month for low back and knee pain.  Her pain is unchanged.  She had been in the hospital at that time cellulitis and infection of her right second toe, and she is still being treated for that.  She rates her pain at about 6 or 7.  It is a burning, stabbing pain.  General activity level is 0 to 4.  All activities aggravate her pain.  Rest and medication tend to help.  She uses a walker or wheelchair.  She is in a wheelchair and her daughter is pushing her today.  REVIEW OF SYSTEMS:  Notable for those difficulties as well as some paresthesias and some spasms.  No suicidal thoughts or aberrant behaviors.  PAST MEDICAL HISTORY AND SOCIAL HISTORY:  Unchanged.  PHYSICAL EXAMINATION:  VITAL SIGNS:  Blood pressure 148/79, pulse 89, respirations 18, O2 sats 92 on room air as tested.  She cannot really give me any kind of leg lift or anything to test her iliopsoas.  She does have some diminished sensation.  Constitutionally she is morbidly obese alert and oriented x3 in a wheelchair again today. Gait not tested.  ASSESSMENT: 1. Facet arthropathy with radiculopathy. 2. Osteoarthritis of the knees. 3. Morbid obesity.  PLAN: 1. Refill her fentanyl patch 50 mcg once transdermally 72 hours 10     with no refill. 2. Oxycodone IR 10 mg one every 6 hour as needed p.r.n. 90 with no     refill. 3. She will follow up in my clinic in 1 month.  Her questions were     encouraged and answered.     Lilinoe Acklin L. [name] Electronically Signed    RLW/MedQ D:  08/10/2010 13:13:27  T:  08/11/2010 04:27:43  Job #:  [id number]

## 2010-09-07 ENCOUNTER — Encounter: Payer: Medicare Other | Attending: Neurosurgery | Admitting: Neurosurgery

## 2010-09-07 DIAGNOSIS — M549 Dorsalgia, unspecified: Secondary | ICD-10-CM | POA: Insufficient documentation

## 2010-09-07 DIAGNOSIS — M1710 Unilateral primary osteoarthritis, unspecified knee: Secondary | ICD-10-CM | POA: Insufficient documentation

## 2010-09-07 DIAGNOSIS — M5380 Other specified dorsopathies, site unspecified: Secondary | ICD-10-CM

## 2010-09-07 DIAGNOSIS — M25569 Pain in unspecified knee: Secondary | ICD-10-CM | POA: Insufficient documentation

## 2010-09-07 DIAGNOSIS — M129 Arthropathy, unspecified: Secondary | ICD-10-CM | POA: Insufficient documentation

## 2010-09-08 NOTE — Assessment & Plan Note (Signed)
Account [id number].  [name] is a patient of Dr. Riley Kill who is seen for back and knee pain.  She reports no change in her pain.  She is in a wheelchair today. She is morbidly obese and has trouble with any ambulation.  She rates her pain at 6.  It is a burning to dull stabbing, aching pain.  Her activity level is 4 to 5.  Her pain is worse in the day and night.  All activities aggravate.  Rest and medication tend to help.  She does not climb steps or drive.  She can walk about 1-5 minutes at a time usually in a wheelchair.  Functionally, she is disabled.  Review of systems is notable for the difficulties described above as well as paresthesias, anxiety, no suicidal thoughts or aberrant behavior.  She has some bladder control issues.  PAST MEDICAL HISTORY:  Unchanged.  SOCIAL HISTORY:  Unchanged.  FAMILY HISTORY:  Unchanged.  PHYSICAL EXAMINATION:  VITAL SIGNS:  Her blood pressure is 115/43, pulse 87, respirations 20, O2 sats 93 on room air.  Her motor strength appears to be intact.  It is very hard to test any kind of confrontation testing with her in a wheelchair and size.  Her sensation is intact.  Constitutionally she is morbidly obese.  She is alert and oriented x3.  She is in a wheelchair.  ASSESSMENT: 1. Facet arthropathy with resultant radiculopathy. 2. Osteoarthritis of the knees. 3. Morbid obesity.  PLAN: 1. Refill oxycodone IR 10 mg one p.o.  q.6 h p.r.n. #90 with no     refill. 2. Fentanyl transdermal 50 mcg one transdermally every 72 hours 10     with no refill. 3. Dr. Riley Kill had recommended dietary counseling as well as better     control.  She states she is not instituting any of that and has not     followed up with Orthopedics due to Dr. Rosalyn Charters recommendations     that he did not feel like that they would pursue and I concur with     that. 4. She will follow up in the clinic in 1 month.  Her questions were     encouraged and answered.     [name] L.  [name] Electronically Signed    RLW/MedQ D:  09/07/2010 13:19:37  T:  09/08/2010 00:53:49  Job #:  [id number]

## 2010-10-06 ENCOUNTER — Encounter: Payer: Medicare Other | Attending: Physical Medicine & Rehabilitation | Admitting: Neurosurgery

## 2010-10-06 DIAGNOSIS — M545 Low back pain, unspecified: Secondary | ICD-10-CM | POA: Insufficient documentation

## 2010-10-06 DIAGNOSIS — M1710 Unilateral primary osteoarthritis, unspecified knee: Secondary | ICD-10-CM

## 2010-10-06 DIAGNOSIS — M5380 Other specified dorsopathies, site unspecified: Secondary | ICD-10-CM

## 2010-10-06 DIAGNOSIS — M129 Arthropathy, unspecified: Secondary | ICD-10-CM | POA: Insufficient documentation

## 2010-10-06 DIAGNOSIS — M25569 Pain in unspecified knee: Secondary | ICD-10-CM | POA: Insufficient documentation

## 2010-10-06 DIAGNOSIS — IMO0002 Reserved for concepts with insufficient information to code with codable children: Secondary | ICD-10-CM | POA: Insufficient documentation

## 2010-10-06 NOTE — Assessment & Plan Note (Signed)
This is a patient of Dr. Riley Kill that is followed for chronic knee and back pain.  She has some right shoulder issues.  She states nothing has really changed with her pain.  She rates it a 7-8.  It is a burning, stabbing, and aching pain.  General activity level is an 8.  She is seen in a wheelchair today.  She does ambulate some.  Pain is worse in the day and night.  Sleep patterns are fair.  Pains is worse with walking, bending, standing, and activity.  Medication helps a little.  She does use a walker or wheelchair.  She does not climb steps or drive.  She can only walk a couple of minutes at a time.  FUNCTIONALITY:  She is on disability.  She needs help with meal prep, household duties, and shopping.  REVIEW OF SYSTEMS:  Notable for those difficulties as well as some limb swelling, coughing, wheezing, spasms, numbness, and anxiety.  No suicidal thoughts or aberrant behaviors.  PAST MEDICAL HISTORY:  Unchanged.  SOCIAL HISTORY:  Unchanged.  FAMILY HISTORY:  Unchanged.  PHYSICAL EXAMINATION:  Blood pressure 147/68, pulse 81, respirations 18, and O2 sats 90 on room air.  Her motor strength is not set tested due to her size, although she states she can ambulate.  Sensation is intact. Constitutionally, she is morbidly obese and again mobile with a wheelchair today.  She has her daughter with her.  She is oriented x3. She is alert, but appears somewhat tired.  ASSESSMENT: 1. Facet arthropathy with radiculopathy 2. Osteoarthritis of knees. 3. Morbid obesity.  PLAN: 1. Refill fentanyl transdermal 50 mcg 1 transdermal every 72 hours,     #10 with no refill. 2. Oxycodone IR 10 mg 1 p.o. q.6 h. p.r.n., #90 with no refill. 3. We will see her back in the clinic in 1 month. 4. Her Oswestry score is 38. 5. Her questions were encouraged and answered.     Russell L. [name] Electronically Signed    RLW/MedQ D:  10/06/2010 10:45:55  T:  10/06/2010 10:55:13  Job #:  [id number]

## 2010-11-03 ENCOUNTER — Ambulatory Visit: Payer: Medicare Other | Admitting: Physical Medicine & Rehabilitation

## 2010-11-04 ENCOUNTER — Encounter: Payer: Medicare Other | Attending: Neurosurgery | Admitting: Neurosurgery

## 2010-11-04 DIAGNOSIS — M719 Bursopathy, unspecified: Secondary | ICD-10-CM | POA: Insufficient documentation

## 2010-11-04 DIAGNOSIS — M25529 Pain in unspecified elbow: Secondary | ICD-10-CM

## 2010-11-04 DIAGNOSIS — M1710 Unilateral primary osteoarthritis, unspecified knee: Secondary | ICD-10-CM | POA: Insufficient documentation

## 2010-11-04 DIAGNOSIS — M67919 Unspecified disorder of synovium and tendon, unspecified shoulder: Secondary | ICD-10-CM | POA: Insufficient documentation

## 2010-11-04 DIAGNOSIS — IMO0002 Reserved for concepts with insufficient information to code with codable children: Secondary | ICD-10-CM | POA: Insufficient documentation

## 2010-11-04 DIAGNOSIS — M25569 Pain in unspecified knee: Secondary | ICD-10-CM | POA: Insufficient documentation

## 2010-11-04 DIAGNOSIS — M5380 Other specified dorsopathies, site unspecified: Secondary | ICD-10-CM

## 2010-11-04 DIAGNOSIS — M129 Arthropathy, unspecified: Secondary | ICD-10-CM | POA: Insufficient documentation

## 2010-11-04 DIAGNOSIS — M545 Low back pain: Secondary | ICD-10-CM

## 2010-11-04 LAB — DIFFERENTIAL
Basophils Relative: 1
Eosinophils Absolute: 0.3
Eosinophils Relative: 3
Lymphs Abs: 2.1
Monocytes Absolute: 1.1 — ABNORMAL HIGH
Monocytes Relative: 10

## 2010-11-04 LAB — CBC
HCT: 36.3
HCT: 37.3
HCT: 36.8
HCT: 37.5
Hemoglobin: 11.7 — ABNORMAL LOW
Hemoglobin: 12
Hemoglobin: 12.1
Hemoglobin: 12.3
MCHC: 32.3
MCHC: 32.8
MCHC: 33.6
MCV: 78.7
MCV: 78.9
MCV: 78.4
MCV: 79.1
Platelets: 302
Platelets: 319
Platelets: 327
Platelets: 313
Platelets: 339
RBC: 4.58
RBC: 4.69
RBC: 4.44
RBC: 4.67
RDW: 17.8 — ABNORMAL HIGH
RDW: 18.2 — ABNORMAL HIGH
RDW: 18.5 — ABNORMAL HIGH
RDW: 18.7 — ABNORMAL HIGH
RDW: 17.8 — ABNORMAL HIGH
WBC: 10
WBC: 10.4

## 2010-11-04 LAB — URINALYSIS, MICROSCOPIC ONLY
Ketones, ur: NEGATIVE
Protein, ur: NEGATIVE
Urobilinogen, UA: 0.2

## 2010-11-04 LAB — COMPREHENSIVE METABOLIC PANEL
ALT: 15
AST: 12
Albumin: 3.2 — ABNORMAL LOW
Alkaline Phosphatase: 45
Calcium: 8.4
GFR calc Af Amer: >60
Glucose, Bld: 99
Potassium: 4.6
Sodium: 138
Total Protein: 6.4

## 2010-11-04 LAB — URINE CULTURE: Colony Count: >=100000

## 2010-11-04 NOTE — Assessment & Plan Note (Signed)
ACCOUNT:  [id number].  This is a patient of Dr. Riley Kill, is followed for chronic knee and back pain.  At the time, she has some right shoulder pain and today that has flared quite a bit for.  She rates her pain at 7 or 8.  It is a sharp, burning, dull, stabbing, tingling pain.  General activity level is zero. Pain is worse in the evening.  Sleep patterns are poor.  Pain is worse with walking, bending, sitting activity, and standing.  Medication tends to help.  She uses a wheelchair for mobility.  She does not climb steps or drive.  Functionally, she is on disability.  REVIEW OF SYSTEMS:  Notable for difficulties as described above as well as some bladder control issues, tremors, trouble walking, spasms, no suicidal thoughts, or aberrant behaviors.  Pill counts correct.  She is scheduled for UDS next appointment.  Review of systems notable for the difficulties as described above, otherwise normal limits.  PAST MEDICAL HISTORY:  Unchanged.  SOCIAL HISTORY:  Unchanged.  FAMILY HISTORY:  Unchanged.  PHYSICAL EXAMINATION:  VITAL SIGNS:  Her blood pressure is 155/78, pulse 82, respirations 18, O2 sats 95 on room air. NEUROLOGIC:  Her motor strength and sensation are intact.  She does have limited range of motion in her right shoulder, passive range of motion, and pain with passive range of motion.  Constitutionally, she is morbidly obese.  She is alert and oriented x3.  Mood and affect is normal.  ASSESSMENT: 1. Facet arthropathy with resultant radiculopathy. 2. Osteoarthritis, knees. 3. Morbid obesity. 4. Bursitis, right shoulder.  PLAN: 1. Refill fentanyl transdermal 50 mcg once transdermally every 72     hours 10 with no refill. 2. Oxycodone IR 10 mg one p.o. q.6 h. p.r.n., #90 with no refill. 3. Injection, right shoulder after informed consent, and risk and     benefits were explained to the patient.  Betadine prep just below     the [facility] joint on the right a little lower due to her  size with     Betadine prep.  They marked the area appropriately.  I injected     with 3 mL of lidocaine 1 mL with 40 mg of Depo.  She tolerated     well.  There is no bleeding, no blood draw back.  She knows to ice     that area the night and limit the use tonight and tomorrow.     Questions were encouraged and answered.  We will see her back in a     month.     Crit Obremski L. [name] Electronically Signed    RLW/MedQ D:  11/04/2010 10:19:48  T:  11/04/2010 10:43:17  Job #:  [id number]

## 2010-11-30 ENCOUNTER — Encounter: Payer: Medicare Other | Attending: Neurosurgery | Admitting: Neurosurgery

## 2010-11-30 DIAGNOSIS — M129 Arthropathy, unspecified: Secondary | ICD-10-CM | POA: Insufficient documentation

## 2010-11-30 DIAGNOSIS — M5380 Other specified dorsopathies, site unspecified: Secondary | ICD-10-CM

## 2010-11-30 DIAGNOSIS — M25569 Pain in unspecified knee: Secondary | ICD-10-CM | POA: Insufficient documentation

## 2010-11-30 DIAGNOSIS — IMO0002 Reserved for concepts with insufficient information to code with codable children: Secondary | ICD-10-CM | POA: Insufficient documentation

## 2010-11-30 DIAGNOSIS — M1710 Unilateral primary osteoarthritis, unspecified knee: Secondary | ICD-10-CM | POA: Insufficient documentation

## 2010-11-30 DIAGNOSIS — M67919 Unspecified disorder of synovium and tendon, unspecified shoulder: Secondary | ICD-10-CM | POA: Insufficient documentation

## 2010-11-30 DIAGNOSIS — M545 Low back pain, unspecified: Secondary | ICD-10-CM | POA: Insufficient documentation

## 2010-11-30 DIAGNOSIS — M719 Bursopathy, unspecified: Secondary | ICD-10-CM | POA: Insufficient documentation

## 2010-11-30 NOTE — Assessment & Plan Note (Signed)
HISTORY OF PRESENT ILLNESS:  This is a patient of Dr. Riley Kill, followed for chronic knee pain and low back pain.  She has had shoulder issues in the past.  She reports no change in her pain, it is a 6-7.  It is a sharp, burning, stabbing, aching pain.  She does not indicate her activity level or when the pain is worse.  Sleep patterns are fair. Pain is worse with sitting and standing.  Rest and medication tend to help.  She is in a wheelchair.  She does not drive or climb steps.  She is on disability.  REVIEW OF SYSTEMS:  Notable for the difficulties described above as well as some weakness, tremors, trouble walking, spasms, and anxiety.  No suicidal thoughts or apparent aberrant behaviors.  Pill counts are correct.  The last UDS was in 2011.  We tried to obtain one today, she could not urinate.  She states that the last time she came she had to be cathed and that it is not viable. The patient was instructed that she will not receive another prescription here until she and gives a urine sample that is after today's visit, and she is aware of that.  PAST MEDICAL HISTORY/SOCIAL HISTORY/FAMILY HISTORY:  Unchanged.  PHYSICAL EXAMINATION:  VITAL SIGNS:  Blood pressure 146/64, pulse 80, respirations 16, O2 sats 96 on room air. NEUROLOGIC:  Constitutionally, she is morbidly obese.  She is alert and oriented x3.  Her affect is alert, again she is in a wheelchair.  There is no ambulation.  ASSESSMENT: 1. Facet arthropathy with radiculopathy. 2. Osteoarthritis of the knees. 3. Morbid obesity. 4. Bursitis, right shoulder, persistent.  PLAN: 1. Refill fentanyl transdermal 50 mcg 1 transdermally every 72 hours,     10 with no refill. 2. Oxycodone IR 10 mg 1 p.o. q.6 hours p.r.n. 90 with no refill. 3. She will be scheduled for UDS at the next appointment prior to any     prescriptions being given.  Her questions were encouraged and     answered.  She understands the situation.     [name]  L. [name] Electronically Signed    RLW/MedQ D:  11/30/2010 10:07:16  T:  11/30/2010 20:27:00  Job #:  [id number]

## 2011-01-03 ENCOUNTER — Encounter: Payer: Medicare Other | Attending: Physical Medicine & Rehabilitation | Admitting: Physical Medicine & Rehabilitation

## 2011-01-03 DIAGNOSIS — M7530 Calcific tendinitis of unspecified shoulder: Secondary | ICD-10-CM

## 2011-01-03 DIAGNOSIS — M545 Low back pain, unspecified: Secondary | ICD-10-CM

## 2011-01-03 DIAGNOSIS — M67919 Unspecified disorder of synovium and tendon, unspecified shoulder: Secondary | ICD-10-CM | POA: Insufficient documentation

## 2011-01-03 DIAGNOSIS — M25569 Pain in unspecified knee: Secondary | ICD-10-CM | POA: Insufficient documentation

## 2011-01-03 DIAGNOSIS — M719 Bursopathy, unspecified: Secondary | ICD-10-CM | POA: Insufficient documentation

## 2011-01-03 DIAGNOSIS — IMO0002 Reserved for concepts with insufficient information to code with codable children: Secondary | ICD-10-CM

## 2011-01-03 DIAGNOSIS — G8929 Other chronic pain: Secondary | ICD-10-CM | POA: Insufficient documentation

## 2011-01-03 DIAGNOSIS — M1710 Unilateral primary osteoarthritis, unspecified knee: Secondary | ICD-10-CM | POA: Insufficient documentation

## 2011-01-03 DIAGNOSIS — M129 Arthropathy, unspecified: Secondary | ICD-10-CM | POA: Insufficient documentation

## 2011-01-03 NOTE — Assessment & Plan Note (Signed)
HISTORY:  [name] is back regarding her chronic back and knee pain.  Pain has been generally stable about 6 to 7/10.  She had a right shoulder injection 2 months ago and had good results with injection by her nurse. Pain today generally sharp, burning, stabbing, aching.  She states that she has lost some weight by making some dietary changes.  She has cut down lot of her soft drinks and trying to eat healthier meats and foods in general.  She still has some problems with constipation, does not empty her bladder regularly.  REVIEW OF SYSTEMS:  Notable for the above.  Full 12-point review is in the written health and history section of the chart.  SOCIAL HISTORY:  Unchanged.  Her daughter is with her today and remains very supportive.  PHYSICAL EXAMINATION:  VITAL SIGNS:  Blood pressure is 158/82, pulse is 18, she is satting 94% on room air. GENERAL:  The patient is pleasant, alert.  Weight seems generally unchanged.  She is alert and appropriate, in good spirits today. EXTREMITIES:  Knees remain tender to palpation with the crepitus noted with extension and flexion.  Did not standard her today. HEART:  Regular. CHEST:  Clear. ABDOMEN:  Soft, nontender.  ASSESSMENT: 1. Chronic lumbar back pain with facet arthropathy and radiculopathy. 2. Bilateral osteoarthritis of the knees. 3. Bursitis of right shoulder with rotator cuff symptoms as well. 4. Morbid obesity.  PLAN: 1. Refill fentanyl 50 mcg q.72 hours, #10. 2. Oxycodone IR 1 q.6 hours p.r.n., #90. 3. UDS was collected today. 4. Discussed diet and appropriate bowel and bladder management.  I     counseled her that her constipation may likely be affecting her     bladder emptying as well. 5. She will see my nurse back in about a month and I will see her back     in about 6 months.     Ranelle Oyster, M.D. Electronically Signed    ZTS/MedQ D:  01/03/2011 10:28:39  T:  01/03/2011 13:21:46  Job #:  [id number]

## 2011-02-02 ENCOUNTER — Encounter: Payer: Medicare Other | Attending: Neurosurgery | Admitting: Neurosurgery

## 2011-02-02 DIAGNOSIS — M719 Bursopathy, unspecified: Secondary | ICD-10-CM | POA: Insufficient documentation

## 2011-02-02 DIAGNOSIS — IMO0002 Reserved for concepts with insufficient information to code with codable children: Secondary | ICD-10-CM | POA: Insufficient documentation

## 2011-02-02 DIAGNOSIS — M1710 Unilateral primary osteoarthritis, unspecified knee: Secondary | ICD-10-CM

## 2011-02-02 DIAGNOSIS — M545 Low back pain, unspecified: Secondary | ICD-10-CM | POA: Insufficient documentation

## 2011-02-02 DIAGNOSIS — M67919 Unspecified disorder of synovium and tendon, unspecified shoulder: Secondary | ICD-10-CM | POA: Insufficient documentation

## 2011-02-02 DIAGNOSIS — G894 Chronic pain syndrome: Secondary | ICD-10-CM

## 2011-02-02 DIAGNOSIS — G8929 Other chronic pain: Secondary | ICD-10-CM | POA: Insufficient documentation

## 2011-02-02 DIAGNOSIS — M129 Arthropathy, unspecified: Secondary | ICD-10-CM | POA: Insufficient documentation

## 2011-02-02 DIAGNOSIS — M25529 Pain in unspecified elbow: Secondary | ICD-10-CM

## 2011-02-03 NOTE — Assessment & Plan Note (Signed)
This patient of Dr. Riley Kill seen for chronic low back and knee pain.  She reports no change in her pain at 8.  It is a sharp, burning and aching type pain.  General activity level is 8.  Pain is worse during the day, evening and night.  Sleep patterns are fair.  Pain is worse with walking, bending and standing. Medication helps.  She walks assisted. She needs a wheelchair most of the time, needs help with transfer.  She did not drive.  Functionally, she needs some help with meal preparation and household duties.  REVIEW OF SYSTEMS:  Notable for the difficulties described above as well as some tremors, spasms, dizziness and anxiety.  No suicidal thoughts or aberrant behaviors.  Last pill count and UDS consistent.  PAST MEDICAL HISTORY, SOCIAL HISTORY AND FAMILY HISTORY:  Unchanged.  PHYSICAL EXAMINATION:  VITAL SIGNS:  Her blood pressure is 134/82, pulse 76, respirations 16 and O2 sats 93 on room air.  EXTREMITIES:  Motor strength and sensation not tested. NEUROLOGIC:  Constitutionally, she is morbidly obese.  She is oriented x3.  Somewhat depressed acting today.  ASSESSMENT: 1. Chronic lumbar pain with facet arthropathy and radiculopathy. 2. Bilateral osteoarthritis of the knees. 3. Bursitis, right shoulder. 4. Morbid obesity.  PLAN: 1. Refilled oxycodone 10 mg 1 p.o. q.6 h. p.r.n. 90 with no refill 2. Fentanyl 50 mcg 1 transdermally every 72 h. 10 with no refills.     Her questions were encouraged and answered.  We will see her in a     month.     Jovaughn Wojtaszek L. [name] Electronically Signed    RLW/MedQ D:  02/02/2011 10:33:10  T:  02/03/2011 01:35:44  Job #:  [id number]

## 2011-02-15 ENCOUNTER — Ambulatory Visit: Payer: Medicare Other | Admitting: Neurosurgery

## 2011-03-03 ENCOUNTER — Encounter: Payer: Medicare Other | Attending: Neurosurgery | Admitting: Neurosurgery

## 2011-03-03 DIAGNOSIS — M1710 Unilateral primary osteoarthritis, unspecified knee: Secondary | ICD-10-CM

## 2011-03-03 DIAGNOSIS — M545 Low back pain, unspecified: Secondary | ICD-10-CM | POA: Insufficient documentation

## 2011-03-03 DIAGNOSIS — G894 Chronic pain syndrome: Secondary | ICD-10-CM

## 2011-03-03 DIAGNOSIS — G8929 Other chronic pain: Secondary | ICD-10-CM | POA: Insufficient documentation

## 2011-03-03 DIAGNOSIS — M719 Bursopathy, unspecified: Secondary | ICD-10-CM | POA: Insufficient documentation

## 2011-03-03 DIAGNOSIS — M67919 Unspecified disorder of synovium and tendon, unspecified shoulder: Secondary | ICD-10-CM | POA: Insufficient documentation

## 2011-03-03 DIAGNOSIS — M25529 Pain in unspecified elbow: Secondary | ICD-10-CM

## 2011-03-03 DIAGNOSIS — M129 Arthropathy, unspecified: Secondary | ICD-10-CM | POA: Insufficient documentation

## 2011-03-03 NOTE — Assessment & Plan Note (Signed)
This is a patient of Dr. Riley Kill, seen for chronic low back and right knee pain.  She reports no change in her pain, it is 6 or 7.  It is sharp burning pain.  With general activity, level is 0-3.  She is in a wheelchair for mobility.  The pain is worse in the morning and evening. Sleep patterns are fair.  All activities aggravate.  Rest, medication help.  She uses a walker or wheelchair.  She does not drive or climb stairs.  Functionally, she is on disability and needs help with ADLs and household duties.  REVIEW OF SYSTEMS:  Notable for difficulties described above as well as some bladder control issues, paresthesias, trouble walking, spasms, dizziness.  No suicidal thoughts or aberrant behaviors.  Pill count and UDS consistent.  PAST MEDICAL HISTORY:  Unchanged.  SOCIAL HISTORY:  Unchanged.  FAMILY HISTORY:  Unchanged.  PHYSICAL EXAMINATION:  CONSTITUTIONAL:  She is morbidly obese. VITAL SIGNS:  Her blood pressure is 145/75, pulse 73, respirations 16, O2 sats 95 on room air. NEUROLOGIC:  She is alert and oriented x3.  Motor strength and sensation not tested due to her size.  She has multiple layers of clothing today due to the weather.  ASSESSMENT: 1. Chronic lumbar pain, facet arthropathy. 2. Bilateral knee osteoarthritis. 3. Bursitis of right shoulder. 4. Morbid obesity.  PLAN: 1. Refill fentanyl 50 mcg 1 transdermally every 72 hours, 10, with no     refill 2. Oxycodone 10 mg 1 p.o. q.6 hours p.r.n., 90, with no refill. 3. Questions were encouraged and answered.  She will see Dr. Riley Kill in     a month.     Oliva Montecalvo L. [name] Electronically Signed    RLW/MedQ D:  03/03/2011 09:11:28  T:  03/03/2011 10:44:35  Job #:  [id number]

## 2011-03-31 ENCOUNTER — Telehealth: Payer: Self-pay | Admitting: Physical Medicine & Rehabilitation

## 2011-03-31 NOTE — Telephone Encounter (Signed)
PT AFRAID TO COME OUT IN WEATHER TOMORROW, WOULD LIKE DAUGHTER, CRYSTAL Dau, TO PICK UP.  WILL YOU ALLOW THIS?

## 2011-03-31 NOTE — Telephone Encounter (Signed)
Pt aware that her daughter can pick up her rx. She just needs to have an ID with her picture and name on it. Pt agrees.

## 2011-04-01 ENCOUNTER — Other Ambulatory Visit: Payer: Self-pay | Admitting: *Deleted

## 2011-04-01 MED ORDER — OXYCODONE HCL 10 MG PO TB12: 10.0000 mg | ORAL_TABLET | Freq: Four times a day (QID) | ORAL | Status: AC | PRN

## 2011-04-01 MED ORDER — FENTANYL 50 MCG/HR TD PT72: 1.0000 | MEDICATED_PATCH | TRANSDERMAL | Status: DC

## 2011-04-17 ENCOUNTER — Other Ambulatory Visit: Payer: Self-pay | Admitting: Physical Medicine & Rehabilitation

## 2011-05-03 ENCOUNTER — Encounter: Payer: Medicare Other | Attending: Neurosurgery | Admitting: Physical Medicine & Rehabilitation

## 2011-05-03 ENCOUNTER — Encounter: Payer: Self-pay | Admitting: Physical Medicine & Rehabilitation

## 2011-05-03 VITALS — BP 151/92 | HR 92 | Resp 20 | Ht 67.0 in | Wt >= 6400 oz

## 2011-05-03 DIAGNOSIS — M67919 Unspecified disorder of synovium and tendon, unspecified shoulder: Secondary | ICD-10-CM | POA: Insufficient documentation

## 2011-05-03 DIAGNOSIS — M47816 Spondylosis without myelopathy or radiculopathy, lumbar region: Secondary | ICD-10-CM

## 2011-05-03 DIAGNOSIS — M75101 Unspecified rotator cuff tear or rupture of right shoulder, not specified as traumatic: Secondary | ICD-10-CM | POA: Insufficient documentation

## 2011-05-03 DIAGNOSIS — M1710 Unilateral primary osteoarthritis, unspecified knee: Secondary | ICD-10-CM | POA: Insufficient documentation

## 2011-05-03 DIAGNOSIS — IMO0002 Reserved for concepts with insufficient information to code with codable children: Secondary | ICD-10-CM | POA: Insufficient documentation

## 2011-05-03 DIAGNOSIS — M47817 Spondylosis without myelopathy or radiculopathy, lumbosacral region: Secondary | ICD-10-CM | POA: Insufficient documentation

## 2011-05-03 DIAGNOSIS — M719 Bursopathy, unspecified: Secondary | ICD-10-CM | POA: Insufficient documentation

## 2011-05-03 MED ORDER — FENTANYL 50 MCG/HR TD PT72: 1.0000 | MEDICATED_PATCH | TRANSDERMAL | Status: DC

## 2011-05-03 MED ORDER — OXYCODONE HCL 10 MG PO TABS: 10.0000 mg | ORAL_TABLET | Freq: Four times a day (QID) | ORAL | Status: DC | PRN

## 2011-05-03 NOTE — Progress Notes (Signed)
Subjective:    Patient ID: [name], female    DOB: 07-Mar-1955, 56 y.o.   MRN: [id number]  HPI  Barbarajean is back regarding her knee and right shoulder pain.  About 6 weeks ago she got tripped up at home and re-aggravated her right knee.  She was doing some exercises and using her arms more to transfer and she irritated her right shoulder as well. Nothing seems to be helping either problem at this point.  Mood has suffered as well.   She remains on her fentanyl and oxycodone for pain control.  She utilizes heat and ice occasionally without a lot of benefit.  She had toenail surgery, removal  About a month ago for an infection  Pain Inventory Average Pain 10 Pain Right Now 10 My pain is constant, sharp, burning, dull, stabbing, tingling and aching  In the last 24 hours, has pain interfered with the following? General activity 10 Relation with others 3 Enjoyment of life 10 What TIME of day is your pain at its worst? all of the time Sleep (in general) Poor  Pain is worse with: walking, bending, sitting, inactivity, standing and can not walk because of pain Pain improves with: medication Relief from Meds: 1  Mobility how many minutes can you walk? can not walk/ has electric w/c  ability to climb steps?  no do you drive?  no use a wheelchair needs help with transfers Do you have any goals in this area?  yes  Function disabled: date disabled 2009 I need assistance with the following:  dressing, bathing, toileting, meal prep, household duties and shopping  Neuro/Psych bladder control problems weakness numbness tremor tingling trouble walking spasms depression anxiety  Prior Studies Any changes since last visit?  no  Physicians involved in your care Any changes since last visit?  no Primary care Dr Lysbeth Galas      Review of Systems  Musculoskeletal: Positive for back pain, arthralgias and gait problem.  Neurological: Positive for tremors, weakness and numbness.   Psychiatric/Behavioral: Positive for dysphoric mood. The patient is nervous/anxious.   All other systems reviewed and are negative.       Objective:   Physical Exam  Constitutional: She is oriented to person, place, and time. She appears well-developed and well-nourished.       Morbidly obese  HENT:  Head: Normocephalic.  Eyes: Conjunctivae and EOM are normal. Pupils are equal, round, and reactive to light.  Neck: Normal range of motion.  Pulmonary/Chest: Effort normal.  Abdominal: Soft.  Musculoskeletal:       Lumbar back: She exhibits decreased range of motion and tenderness.       Pain with palpation over the right subacromial space as well with ER and IR of the shoulder. RTC inpingement maneuvers were positive.  She has joint line tenderness at the right knee with effusions. Positive crepitus. She poorly tolerates ROM at the right knee.  Neurological: She is alert and oriented to person, place, and time.  Skin: Skin is warm and dry.  Psychiatric: She has a normal mood and affect.          Assessment & Plan:  ASSESSMENT:  1. Chronic lumbar pain, facet arthropathy.  2. Bilateral knee osteoarthritis.  3. Bursitis of right shoulder.  4. Morbid obesity.  PLAN:  1. Refilled fentanyl 50 mcg 1 transdermally every 72 hours, 10, with no  refill  2. Oxycodone 10 mg 1 p.o. q.6 hours p.r.n., 90, with no refill.  3. After informed consent  i injected the right shoulder and knee each with 40mg  kenalogc and 3cc of 1% lidocaine. The patient tolerated well. Sterile technique and aspiration technique were utilized. 4. F/u in one month with RN

## 2011-05-03 NOTE — Patient Instructions (Signed)
Continue working on range of motion and exercise to tolerance   Rotator Cuff Injury The rotator cuff is the collective set of muscles and tendons that make up the stabilizing unit of your shoulder. This unit holds in the ball of the humerus (upper arm bone) in the socket of the scapula (shoulder blade). Injuries to this stabilizing unit most commonly come from sports or activities that cause the arm to be moved repeatedly over the head. Examples of this include throwing, weight lifting, swimming, racquet sports, or an injury such as falling on your arm. Chronic (longstanding) irritation of this unit can cause inflammation (soreness), bursitis, and eventual damage to the tendons to the point of rupture (tear). An acute (sudden) injury of the rotator cuff can result in a partial or complete tear. You may need surgery with complete tears. Small or partial rotator cuff tears may be treated conservatively with temporary immobilization, exercises and rest. Physical therapy may be needed. HOME CARE INSTRUCTIONS   Apply ice to the injury for 15 to 20 minutes 3 to 4 times per day for the first 2 days. Put the ice in a plastic bag and place a towel between the bag of ice and your skin.   If you have a shoulder immobilizer (sling and straps), do not remove it for as long as directed by your caregiver or until you see a caregiver for a follow-up examination. If you need to remove it, move your arm as little as possible.   You may want to sleep on several pillows or in a recliner at night to lessen swelling and pain.   Only take over-the-counter or prescription medicines for pain, discomfort, or fever as directed by your caregiver.   Do simple hand squeezing exercises with a soft rubber ball to decrease hand swelling.  SEEK MEDICAL CARE IF:   Pain in your shoulder increases or new pain or numbness develops in your arm, hand, or fingers.   Your hand or fingers are colder than your other hand.  SEEK IMMEDIATE  MEDICAL CARE IF:   Your arm, hand, or fingers are numb or tingling.   Your arm, hand, or fingers are increasingly swollen and painful, or turn white or blue.  Document Released: 01/22/2000 Document Revised: 01/13/2011 Document Reviewed: 01/15/2008 [facility] Patient Information 2012 [place], [place].

## 2011-05-04 ENCOUNTER — Encounter: Payer: Self-pay | Admitting: Physical Medicine & Rehabilitation

## 2011-05-16 ENCOUNTER — Other Ambulatory Visit: Payer: Self-pay | Admitting: Physical Medicine & Rehabilitation

## 2011-06-02 ENCOUNTER — Encounter: Payer: Medicare Other | Attending: Physical Medicine & Rehabilitation | Admitting: *Deleted

## 2011-06-02 VITALS — BP 147/83 | HR 82 | Resp 16 | Ht 67.0 in | Wt >= 6400 oz

## 2011-06-02 DIAGNOSIS — M129 Arthropathy, unspecified: Secondary | ICD-10-CM | POA: Insufficient documentation

## 2011-06-02 DIAGNOSIS — IMO0002 Reserved for concepts with insufficient information to code with codable children: Secondary | ICD-10-CM | POA: Insufficient documentation

## 2011-06-02 DIAGNOSIS — M719 Bursopathy, unspecified: Secondary | ICD-10-CM

## 2011-06-02 DIAGNOSIS — M47816 Spondylosis without myelopathy or radiculopathy, lumbar region: Secondary | ICD-10-CM

## 2011-06-02 DIAGNOSIS — M75101 Unspecified rotator cuff tear or rupture of right shoulder, not specified as traumatic: Secondary | ICD-10-CM

## 2011-06-02 DIAGNOSIS — Z79899 Other long term (current) drug therapy: Secondary | ICD-10-CM | POA: Insufficient documentation

## 2011-06-02 DIAGNOSIS — M1710 Unilateral primary osteoarthritis, unspecified knee: Secondary | ICD-10-CM

## 2011-06-02 DIAGNOSIS — M545 Low back pain, unspecified: Secondary | ICD-10-CM | POA: Insufficient documentation

## 2011-06-02 DIAGNOSIS — M75100 Unspecified rotator cuff tear or rupture of unspecified shoulder, not specified as traumatic: Secondary | ICD-10-CM | POA: Insufficient documentation

## 2011-06-02 DIAGNOSIS — G8929 Other chronic pain: Secondary | ICD-10-CM | POA: Insufficient documentation

## 2011-06-02 DIAGNOSIS — M47817 Spondylosis without myelopathy or radiculopathy, lumbosacral region: Secondary | ICD-10-CM

## 2011-06-02 MED ORDER — OXYCODONE HCL 10 MG PO TABS: 10.0000 mg | ORAL_TABLET | Freq: Four times a day (QID) | ORAL | Status: DC | PRN

## 2011-06-02 MED ORDER — FENTANYL 50 MCG/HR TD PT72: 1.0000 | MEDICATED_PATCH | TRANSDERMAL | Status: DC

## 2011-06-02 NOTE — Progress Notes (Signed)
Pt states that she is having issues with her Fentanyl patches sticking. She has been placing a bandaid over the patch and it has been helpful. Advised to keep medications in a safe place.

## 2011-06-21 ENCOUNTER — Other Ambulatory Visit: Payer: Self-pay | Admitting: *Deleted

## 2011-06-21 MED ORDER — GABAPENTIN 300 MG PO CAPS: 300.0000 mg | ORAL_CAPSULE | Freq: Three times a day (TID) | ORAL | Status: DC

## 2011-07-01 ENCOUNTER — Ambulatory Visit: Payer: Medicare Other | Admitting: Physical Medicine and Rehabilitation

## 2011-07-01 ENCOUNTER — Encounter: Payer: Self-pay | Admitting: Physical Medicine and Rehabilitation

## 2011-07-01 ENCOUNTER — Encounter: Payer: Medicare Other | Attending: Physical Medicine & Rehabilitation | Admitting: Physical Medicine and Rehabilitation

## 2011-07-01 VITALS — BP 148/92 | HR 82 | Ht 67.0 in | Wt >= 6400 oz

## 2011-07-01 DIAGNOSIS — M1710 Unilateral primary osteoarthritis, unspecified knee: Secondary | ICD-10-CM | POA: Insufficient documentation

## 2011-07-01 DIAGNOSIS — M47817 Spondylosis without myelopathy or radiculopathy, lumbosacral region: Secondary | ICD-10-CM

## 2011-07-01 DIAGNOSIS — M545 Low back pain, unspecified: Secondary | ICD-10-CM | POA: Insufficient documentation

## 2011-07-01 DIAGNOSIS — M25519 Pain in unspecified shoulder: Secondary | ICD-10-CM | POA: Insufficient documentation

## 2011-07-01 DIAGNOSIS — G8929 Other chronic pain: Secondary | ICD-10-CM | POA: Insufficient documentation

## 2011-07-01 DIAGNOSIS — M12819 Other specific arthropathies, not elsewhere classified, unspecified shoulder: Secondary | ICD-10-CM

## 2011-07-01 DIAGNOSIS — M67919 Unspecified disorder of synovium and tendon, unspecified shoulder: Secondary | ICD-10-CM

## 2011-07-01 DIAGNOSIS — M25569 Pain in unspecified knee: Secondary | ICD-10-CM | POA: Insufficient documentation

## 2011-07-01 DIAGNOSIS — M75101 Unspecified rotator cuff tear or rupture of right shoulder, not specified as traumatic: Secondary | ICD-10-CM

## 2011-07-01 DIAGNOSIS — M170 Bilateral primary osteoarthritis of knee: Secondary | ICD-10-CM

## 2011-07-01 DIAGNOSIS — M47816 Spondylosis without myelopathy or radiculopathy, lumbar region: Secondary | ICD-10-CM

## 2011-07-01 DIAGNOSIS — M19019 Primary osteoarthritis, unspecified shoulder: Secondary | ICD-10-CM

## 2011-07-01 MED ORDER — FENTANYL 50 MCG/HR TD PT72: 1.0000 | MEDICATED_PATCH | TRANSDERMAL | Status: DC

## 2011-07-01 MED ORDER — OXYCODONE HCL 10 MG PO TABS: 10.0000 mg | ORAL_TABLET | Freq: Four times a day (QID) | ORAL | Status: DC | PRN

## 2011-07-01 NOTE — Patient Instructions (Signed)
Continue with medication. Advised patient to loose weight.

## 2011-07-01 NOTE — Progress Notes (Signed)
Subjective:    Patient ID: [name], female    DOB: [birth year], 56 y.o.   MRN: [id number]  HPI The patient is a morbidly obese, 56 year old female female, who presents with chronic shoulder , knee and LBP . Patient denies any radiating symptoms. Medications  alleviate the symptoms some. Patient reports that she needs a total knee replacement on the right but she would have to loose at least 120 pounds before the surgery could be performed. Pain Inventory Average Pain 8 Pain Right Now 8 My pain is sharp, burning, dull, stabbing, tingling and aching  In the last 24 hours, has pain interfered with the following? General activity 0 Relation with others 2 Enjoyment of life 0 What TIME of day is your pain at its worst? all the time Sleep (in general) Fair  Pain is worse with: walking, bending, sitting, inactivity, standing and some activites Pain improves with: medication Relief from Meds: 5  Mobility ability to climb steps?  no do you drive?  no use a wheelchair  Function disabled: date disabled   Neuro/Psych bladder control problems weakness numbness tremor tingling trouble walking spasms dizziness  Prior Studies Any changes since last visit?  no  Physicians involved in your care Any changes since last visit?  no   Family History  Problem Relation Age of Onset  . Asthma Mother   . COPD Mother   . Heart disease Mother   . Heart disease Father    History   Social History  . Marital Status: Married    Spouse Name: N/A    Number of Children: N/A  . Years of Education: N/A   Social History Main Topics  . Smoking status: Never Smoker   . Smokeless tobacco: Never Used  . Alcohol Use: None  . Drug Use: None  . Sexually Active: None   Other Topics Concern  . None   Social History Narrative  . None   Past Surgical History  Procedure Date  . Abdominal hysterectomy   . Cesarean section     x2  . Cholecystectomy    Past Medical History  Diagnosis  Date  . Hypertension   . Thoracic or lumbosacral neuritis or radiculitis, unspecified   . Lumbago   . Obesity   . Primary localized osteoarthrosis, lower leg   . Facet syndrome, lumbar   . Neuromuscular disorder   . GERD (gastroesophageal reflux disease)   . Depression   . Anxiety    BP 148/92  Pulse 82  Ht 5\' 7"  (1.702 m)  Wt 411 lb (186.428 kg)  BMI 64.37 kg/m2     Review of Systems  Neurological: Positive for weakness and numbness.  All other systems reviewed and are negative.       Objective:   Physical Exam  Constitutional: She is oriented to person, place, and time. She appears well-developed.       Morbidly obese in wheel chair  HENT:  Head: Normocephalic.  Eyes: Pupils are equal, round, and reactive to light.  Neck: Normal range of motion.  Neurological: She is alert and oriented to person, place, and time.  Skin: Skin is warm and dry.  Psychiatric: She has a normal mood and affect.  Symmetric normal motor tone is noted throughout. Normal muscle bulk. Muscle testing reveals 5/5 muscle strength of the upper extremity, and 5/5 of the lower extremity, except iliopsoas on the right3/5, on the left 2+. Testing was limited, because patient is in wheel chair, and  weights over 400lbs.  DTR in the upper and lower extremity are present and symmetric 2+. No clonus is noted.         Assessment & Plan:  56 year old morbidly obese female with a history of chronic low back pain, osteoarthritis on the right knee, knee pain and right shoulder pain after rotator cuff injury. Discussed with the patient that she should try to lose weight. Made some suggestions that she  should substitute chips with vegetables and fruits. Reordered patient's pain medication. The patient should follow up in one month.

## 2011-07-05 ENCOUNTER — Other Ambulatory Visit: Payer: Self-pay | Admitting: Physical Medicine & Rehabilitation

## 2011-07-14 ENCOUNTER — Encounter: Payer: Self-pay | Admitting: Physical Medicine & Rehabilitation

## 2011-07-29 ENCOUNTER — Encounter: Payer: Medicare Other | Attending: Physical Medicine & Rehabilitation | Admitting: Physical Medicine and Rehabilitation

## 2011-07-29 ENCOUNTER — Encounter: Payer: Self-pay | Admitting: Physical Medicine and Rehabilitation

## 2011-07-29 VITALS — BP 149/76 | HR 97 | Resp 18 | Ht 67.0 in | Wt >= 6400 oz

## 2011-07-29 DIAGNOSIS — K219 Gastro-esophageal reflux disease without esophagitis: Secondary | ICD-10-CM | POA: Insufficient documentation

## 2011-07-29 DIAGNOSIS — M47816 Spondylosis without myelopathy or radiculopathy, lumbar region: Secondary | ICD-10-CM

## 2011-07-29 DIAGNOSIS — M545 Low back pain, unspecified: Secondary | ICD-10-CM | POA: Insufficient documentation

## 2011-07-29 DIAGNOSIS — M47817 Spondylosis without myelopathy or radiculopathy, lumbosacral region: Secondary | ICD-10-CM

## 2011-07-29 DIAGNOSIS — M67919 Unspecified disorder of synovium and tendon, unspecified shoulder: Secondary | ICD-10-CM

## 2011-07-29 DIAGNOSIS — I10 Essential (primary) hypertension: Secondary | ICD-10-CM | POA: Insufficient documentation

## 2011-07-29 DIAGNOSIS — M25569 Pain in unspecified knee: Secondary | ICD-10-CM

## 2011-07-29 DIAGNOSIS — M25519 Pain in unspecified shoulder: Secondary | ICD-10-CM | POA: Insufficient documentation

## 2011-07-29 DIAGNOSIS — M25562 Pain in left knee: Secondary | ICD-10-CM

## 2011-07-29 DIAGNOSIS — G8929 Other chronic pain: Secondary | ICD-10-CM | POA: Insufficient documentation

## 2011-07-29 DIAGNOSIS — M75101 Unspecified rotator cuff tear or rupture of right shoulder, not specified as traumatic: Secondary | ICD-10-CM

## 2011-07-29 DIAGNOSIS — M1710 Unilateral primary osteoarthritis, unspecified knee: Secondary | ICD-10-CM

## 2011-07-29 MED ORDER — OXYCODONE HCL 10 MG PO TABS: 10.0000 mg | ORAL_TABLET | Freq: Four times a day (QID) | ORAL | Status: DC | PRN

## 2011-07-29 MED ORDER — FENTANYL 50 MCG/HR TD PT72: 1.0000 | MEDICATED_PATCH | TRANSDERMAL | Status: DC

## 2011-07-29 NOTE — Patient Instructions (Signed)
Continue trying to loose weight, continue with medication. Try to keep moving even from a seated position.

## 2011-07-29 NOTE — Progress Notes (Signed)
Subjective:    Patient ID: [name], female    DOB: [birth year], 56 y.o.   MRN: [id number]  HPI The patient is a morbidly obese, 56 year old female female, who presents with chronic shoulder , knee and LBP . Patient denies any radiating symptoms. Medications alleviate the symptoms some. Patient reports that she needs a total knee replacement on the right but she would have to loose at least 120 pounds before the surgery could be performed.  Pain Inventory Average Pain 9 Pain Right Now 9 My pain is sharp, burning, stabbing, tingling and aching  In the last 24 hours, has pain interfered with the following? General activity 0 Relation with others 1 Enjoyment of life 0 What TIME of day is your pain at its worst? Morning, Night Sleep (in general) Poor  Pain is worse with: walking, bending, sitting and standing Pain improves with: medication Relief from Meds: 3  Mobility ability to climb steps?  no do you drive?  no use a wheelchair  Function I need assistance with the following:  dressing, bathing, toileting, meal prep, household duties and shopping  Neuro/Psych bladder control problems numbness tremor tingling trouble walking spasms dizziness  Prior Studies Any changes since last visit?  no  Physicians involved in your care Any changes since last visit?  no   Family History  Problem Relation Age of Onset  . Asthma Mother   . COPD Mother   . Heart disease Mother   . Heart disease Father    History   Social History  . Marital Status: Married    Spouse Name: N/A    Number of Children: N/A  . Years of Education: N/A   Social History Main Topics  . Smoking status: Never Smoker   . Smokeless tobacco: Never Used  . Alcohol Use: None  . Drug Use: None  . Sexually Active: None   Other Topics Concern  . None   Social History Narrative  . None   Past Surgical History  Procedure Date  . Abdominal hysterectomy   . Cesarean section     x2  .  Cholecystectomy    Past Medical History  Diagnosis Date  . Hypertension   . Thoracic or lumbosacral neuritis or radiculitis, unspecified   . Lumbago   . Obesity   . Primary localized osteoarthrosis, lower leg   . Facet syndrome, lumbar   . Neuromuscular disorder   . GERD (gastroesophageal reflux disease)   . Depression   . Anxiety    BP 149/76  Pulse 97  Resp 18  Ht 5\' 7"  (1.702 m)  Wt 411 lb (186.428 kg)  BMI 64.37 kg/m2  SpO2 94%      Review of Systems  Constitutional: Negative.   HENT: Negative.   Eyes: Negative.   Respiratory: Negative.   Cardiovascular: Positive for leg swelling.  Gastrointestinal: Negative.   Genitourinary: Negative.   Musculoskeletal: Negative.   Skin: Negative.   Neurological: Positive for tremors and numbness.  Hematological: Negative.   Psychiatric/Behavioral: Negative.        Objective:   Physical Exam Constitutional: She is oriented to person, place, and time. She appears well-developed.  Morbidly obese in wheel chair  HENT:  Head: Normocephalic.  Eyes: Pupils are equal, round, and reactive to light.  Neck: Normal range of motion.  Neurological: She is alert and oriented to person, place, and time.  Skin: Skin is warm and dry.  Psychiatric: She has a normal mood and affect.  Symmetric normal motor tone is noted throughout. Normal muscle bulk. Muscle testing reveals 5/5 muscle strength of the upper extremity, and 5/5 of the lower extremity, except iliopsoas on the right3/5, on the left 2+. Testing was limited, because patient is in wheel chair, and weights over 400lbs.  DTR in the upper and lower extremity are present and symmetric 2+. No clonus is noted.        Assessment & Plan:  56 year old morbidly obese female with a history of chronic low back pain, osteoarthritis on the right knee, knee pain and right shoulder pain after rotator cuff injury.  Discussed with the patient that she should try to lose weight. Made some  suggestions that she should substitute chips with vegetables and fruits. Reordered patient's pain medication. The patient should follow up in one month.

## 2011-08-26 ENCOUNTER — Encounter: Payer: Self-pay | Admitting: Physical Medicine and Rehabilitation

## 2011-08-26 ENCOUNTER — Encounter: Payer: Medicare Other | Attending: Physical Medicine & Rehabilitation | Admitting: Physical Medicine and Rehabilitation

## 2011-08-26 VITALS — BP 144/63 | HR 100 | Resp 14 | Ht 67.0 in | Wt >= 6400 oz

## 2011-08-26 DIAGNOSIS — M25519 Pain in unspecified shoulder: Secondary | ICD-10-CM | POA: Insufficient documentation

## 2011-08-26 DIAGNOSIS — M67919 Unspecified disorder of synovium and tendon, unspecified shoulder: Secondary | ICD-10-CM

## 2011-08-26 DIAGNOSIS — M47817 Spondylosis without myelopathy or radiculopathy, lumbosacral region: Secondary | ICD-10-CM

## 2011-08-26 DIAGNOSIS — M75101 Unspecified rotator cuff tear or rupture of right shoulder, not specified as traumatic: Secondary | ICD-10-CM

## 2011-08-26 DIAGNOSIS — M47816 Spondylosis without myelopathy or radiculopathy, lumbar region: Secondary | ICD-10-CM

## 2011-08-26 DIAGNOSIS — M545 Low back pain, unspecified: Secondary | ICD-10-CM | POA: Insufficient documentation

## 2011-08-26 DIAGNOSIS — G8929 Other chronic pain: Secondary | ICD-10-CM | POA: Insufficient documentation

## 2011-08-26 DIAGNOSIS — M25569 Pain in unspecified knee: Secondary | ICD-10-CM | POA: Insufficient documentation

## 2011-08-26 DIAGNOSIS — M1710 Unilateral primary osteoarthritis, unspecified knee: Secondary | ICD-10-CM

## 2011-08-26 MED ORDER — OXYCODONE HCL 10 MG PO TABS: 10.0000 mg | ORAL_TABLET | Freq: Four times a day (QID) | ORAL | Status: DC | PRN

## 2011-08-26 MED ORDER — FENTANYL 50 MCG/HR TD PT72: 1.0000 | MEDICATED_PATCH | TRANSDERMAL | Status: DC

## 2011-08-26 NOTE — Patient Instructions (Signed)
Continue with exercises , continue with loosing weight

## 2011-08-26 NOTE — Progress Notes (Signed)
Subjective:    Patient ID: [name], female    DOB: [birth year], 56 y.o.   MRN: [id number]  HPI The patient is a morbidly obese, 56 year old female female, who presents with chronic shoulder , knee and LBP . Patient denies any radiating symptoms. Medications alleviate the symptoms some. Patient reports that she needs a total knee replacement on the right but she would have to loose at least 120 pounds before the surgery could be performed.Doing quite well today. She reports, that she has substitute half of the sugar she puts in her tea with Stevia.  Pain Inventory Average Pain 8 Pain Right Now 8 My pain is sharp, burning, stabbing, tingling and aching  In the last 24 hours, has pain interfered with the following? General activity 10 Relation with others 10 Enjoyment of life 9 What TIME of day is your pain at its worst? morning and night Sleep (in general) Fair  Pain is worse with: walking, bending, sitting and standing Pain improves with: medication Relief from Meds: 4  Mobility ability to climb steps?  no do you drive?  no use a wheelchair  Function disabled: date disabled 2009  Neuro/Psych bladder control problems weakness numbness tremor tingling trouble walking spasms dizziness  Prior Studies Any changes since last visit?  no  Physicians involved in your care Any changes since last visit?  no   Family History  Problem Relation Age of Onset  . Asthma Mother   . COPD Mother   . Heart disease Mother   . Heart disease Father    History   Social History  . Marital Status: Married    Spouse Name: N/A    Number of Children: N/A  . Years of Education: N/A   Social History Main Topics  . Smoking status: Never Smoker   . Smokeless tobacco: Never Used  . Alcohol Use: None  . Drug Use: None  . Sexually Active: None   Other Topics Concern  . None   Social History Narrative  . None   Past Surgical History  Procedure Date  . Abdominal  hysterectomy   . Cesarean section     x2  . Cholecystectomy    Past Medical History  Diagnosis Date  . Hypertension   . Thoracic or lumbosacral neuritis or radiculitis, unspecified   . Lumbago   . Obesity   . Primary localized osteoarthrosis, lower leg   . Facet syndrome, lumbar   . Neuromuscular disorder   . GERD (gastroesophageal reflux disease)   . Depression   . Anxiety    BP 144/63  Pulse 100  Resp 14  Ht 5\' 7"  (1.702 m)  Wt 411 lb (186.428 kg)  BMI 64.37 kg/m2  SpO2 96%     Review of Systems  Musculoskeletal: Positive for myalgias, back pain, arthralgias and gait problem.  Neurological: Positive for dizziness, weakness and numbness.  All other systems reviewed and are negative.       Objective:   Physical Exam Constitutional: She is oriented to person, place, and time. She appears well-developed.  Morbidly obese in wheel chair  HENT:  Head: Normocephalic.  Eyes: Pupils are equal, round, and reactive to light.  Neck: Normal range of motion.  Neurological: She is alert and oriented to person, place, and time.  Skin: Skin is warm and dry.  Psychiatric: She has a normal mood and affect.  Symmetric normal motor tone is noted throughout. Normal muscle bulk. Muscle testing reveals 5/5 muscle strength of  the upper extremity, and 5/5 of the lower extremity, except iliopsoas on the right3/5, on the left 2+. Testing was limited, because patient is in wheel chair, and weights over 400lbs.  DTR in the upper and lower extremity are present and symmetric 2+. No clonus is noted.        Assessment & Plan:  56 year old morbidly obese female with a history of chronic low back pain, osteoarthritis on the right knee, knee pain and right shoulder pain after rotator cuff injury.  Discussed with the patient that she should try to lose weight. Made some suggestions that she should substitute chips with vegetables and fruits. She states that she has replaced half the shuger for  her tea with Stevia, and that she is more motivated to loose weight, since an old friend stated that she will support the patient doing this. Reordered patient's pain medication. The patient should follow up in one month.

## 2011-09-21 ENCOUNTER — Encounter: Payer: Self-pay | Admitting: Physical Medicine and Rehabilitation

## 2011-09-21 ENCOUNTER — Encounter
Payer: Medicare Other | Attending: Physical Medicine and Rehabilitation | Admitting: Physical Medicine and Rehabilitation

## 2011-09-21 VITALS — BP 152/81 | HR 80 | Resp 16 | Ht 67.0 in | Wt >= 6400 oz

## 2011-09-21 DIAGNOSIS — M67919 Unspecified disorder of synovium and tendon, unspecified shoulder: Secondary | ICD-10-CM

## 2011-09-21 DIAGNOSIS — M545 Low back pain, unspecified: Secondary | ICD-10-CM | POA: Insufficient documentation

## 2011-09-21 DIAGNOSIS — M75101 Unspecified rotator cuff tear or rupture of right shoulder, not specified as traumatic: Secondary | ICD-10-CM

## 2011-09-21 DIAGNOSIS — M47816 Spondylosis without myelopathy or radiculopathy, lumbar region: Secondary | ICD-10-CM

## 2011-09-21 DIAGNOSIS — M25569 Pain in unspecified knee: Secondary | ICD-10-CM | POA: Insufficient documentation

## 2011-09-21 DIAGNOSIS — M1710 Unilateral primary osteoarthritis, unspecified knee: Secondary | ICD-10-CM | POA: Insufficient documentation

## 2011-09-21 DIAGNOSIS — G8929 Other chronic pain: Secondary | ICD-10-CM | POA: Insufficient documentation

## 2011-09-21 DIAGNOSIS — M25519 Pain in unspecified shoulder: Secondary | ICD-10-CM | POA: Insufficient documentation

## 2011-09-21 DIAGNOSIS — M47817 Spondylosis without myelopathy or radiculopathy, lumbosacral region: Secondary | ICD-10-CM

## 2011-09-21 DIAGNOSIS — M1711 Unilateral primary osteoarthritis, right knee: Secondary | ICD-10-CM

## 2011-09-21 MED ORDER — FENTANYL 50 MCG/HR TD PT72: 1.0000 | MEDICATED_PATCH | TRANSDERMAL | Status: DC

## 2011-09-21 MED ORDER — OXYCODONE HCL 10 MG PO TABS: 10.0000 mg | ORAL_TABLET | Freq: Four times a day (QID) | ORAL | Status: DC | PRN

## 2011-09-21 NOTE — Progress Notes (Signed)
Subjective:    Patient ID: [name], female    DOB: [birth year], 56 y.o.   MRN: [id number]  HPI The patient is a morbidly obese, 56 year old female female, who presents with chronic shoulder , knee and LBP . Patient denies any radiating symptoms. Medications alleviate the symptoms some. Patient reports that she needs a total knee replacement on the right but she would have to loose at least 120 pounds before the surgery could be performed.Doing quite well today. She reports, that she has substitute half of the sugar she puts in her tea with Stevia. She also reports, that she is eating much healthier now, and less calories. She states that she has already lost 2 lbs. And is in a good mood.  Pain Inventory Average Pain 8 Pain Right Now 8 My pain is intermittent, sharp, burning, dull, stabbing, tingling and aching  In the last 24 hours, has pain interfered with the following? General activity 10 Relation with others 10 Enjoyment of life 10 What TIME of day is your pain at its worst? evening Sleep (in general) Fair  Pain is worse with: walking, bending, sitting and standing Pain improves with: medication Relief from Meds: 6  Mobility use a walker ability to climb steps?  no do you drive?  no use a wheelchair  Function disabled: date disabled  I need assistance with the following:  feeding, dressing, bathing, toileting, meal prep, household duties and shopping  Neuro/Psych trouble walking  Prior Studies Any changes since last visit?  no  Physicians involved in your care Any changes since last visit?  no   Family History  Problem Relation Age of Onset  . Asthma Mother   . COPD Mother   . Heart disease Mother   . Heart disease Father    History   Social History  . Marital Status: Married    Spouse Name: N/A    Number of Children: N/A  . Years of Education: N/A   Social History Main Topics  . Smoking status: Never Smoker   . Smokeless tobacco: Never Used  .  Alcohol Use: None  . Drug Use: None  . Sexually Active: None   Other Topics Concern  . None   Social History Narrative  . None   Past Surgical History  Procedure Date  . Abdominal hysterectomy   . Cesarean section     x2  . Cholecystectomy    Past Medical History  Diagnosis Date  . Hypertension   . Thoracic or lumbosacral neuritis or radiculitis, unspecified   . Lumbago   . Obesity   . Primary localized osteoarthrosis, lower leg   . Facet syndrome, lumbar   . Neuromuscular disorder   . GERD (gastroesophageal reflux disease)   . Depression   . Anxiety    BP 152/81  Pulse 80  Resp 16  Ht 5\' 7"  (1.702 m)  Wt 409 lb (185.521 kg)  BMI 64.06 kg/m2  SpO2 94%    Review of Systems  Musculoskeletal: Positive for back pain and gait problem.       Knee and shoulder pain on right side  All other systems reviewed and are negative.       Objective:   Physical Exam Constitutional: She is oriented to person, place, and time. She appears well-developed.  Morbidly obese in wheel chair  HENT:  Head: Normocephalic.  Eyes: Pupils are equal, round, and reactive to light.  Neck: Normal range of motion.  Neurological: She is alert  and oriented to person, place, and time.  Skin: Skin is warm and dry.  Psychiatric: She has a normal mood and affect.  Symmetric normal motor tone is noted throughout. Normal muscle bulk. Muscle testing reveals 5/5 muscle strength of the upper extremity, and 5/5 of the lower extremity, except iliopsoas on the right3/5, on the left 2+. Testing was limited, because patient is in wheel chair, and weights over 400lbs.  DTR in the upper and lower extremity are present and symmetric 2+. No clonus is noted.        Assessment & Plan:  56 year old morbidly obese female with a history of chronic low back pain, osteoarthritis on the right knee, knee pain and right shoulder pain after rotator cuff injury.  Discussed with the patient that she should try to  lose weight. Made some suggestions that she should substitute chips with vegetables and fruits. She states that she has replaced half the shuger for her tea with Stevia, and that she is more motivated to loose weight, since an old friend stated that she will support the patient doing this.She also reports, that her daughter is helping her to eat more healthy and less , she has lost 2 lbs already. I encouraged her to continue with her diet and exercise program. Reordered patient's pain medication. The patient should follow up in one month.

## 2011-09-21 NOTE — Patient Instructions (Signed)
Continue with your diet and doing your exercise program .

## 2011-10-21 ENCOUNTER — Encounter: Payer: Self-pay | Admitting: Physical Medicine and Rehabilitation

## 2011-10-21 ENCOUNTER — Encounter
Payer: Medicare Other | Attending: Physical Medicine and Rehabilitation | Admitting: Physical Medicine and Rehabilitation

## 2011-10-21 VITALS — BP 180/68 | HR 88 | Resp 14 | Wt >= 6400 oz

## 2011-10-21 DIAGNOSIS — M1710 Unilateral primary osteoarthritis, unspecified knee: Secondary | ICD-10-CM | POA: Insufficient documentation

## 2011-10-21 DIAGNOSIS — I10 Essential (primary) hypertension: Secondary | ICD-10-CM | POA: Insufficient documentation

## 2011-10-21 DIAGNOSIS — M67919 Unspecified disorder of synovium and tendon, unspecified shoulder: Secondary | ICD-10-CM

## 2011-10-21 DIAGNOSIS — M47816 Spondylosis without myelopathy or radiculopathy, lumbar region: Secondary | ICD-10-CM

## 2011-10-21 DIAGNOSIS — M75101 Unspecified rotator cuff tear or rupture of right shoulder, not specified as traumatic: Secondary | ICD-10-CM

## 2011-10-21 DIAGNOSIS — Z598 Other problems related to housing and economic circumstances: Secondary | ICD-10-CM | POA: Insufficient documentation

## 2011-10-21 DIAGNOSIS — M47817 Spondylosis without myelopathy or radiculopathy, lumbosacral region: Secondary | ICD-10-CM

## 2011-10-21 DIAGNOSIS — M545 Low back pain, unspecified: Secondary | ICD-10-CM | POA: Insufficient documentation

## 2011-10-21 DIAGNOSIS — M25569 Pain in unspecified knee: Secondary | ICD-10-CM

## 2011-10-21 DIAGNOSIS — R269 Unspecified abnormalities of gait and mobility: Secondary | ICD-10-CM | POA: Insufficient documentation

## 2011-10-21 DIAGNOSIS — M25519 Pain in unspecified shoulder: Secondary | ICD-10-CM | POA: Insufficient documentation

## 2011-10-21 DIAGNOSIS — G8929 Other chronic pain: Secondary | ICD-10-CM | POA: Insufficient documentation

## 2011-10-21 DIAGNOSIS — Z5987 Material hardship due to limited financial resources, not elsewhere classified: Secondary | ICD-10-CM | POA: Insufficient documentation

## 2011-10-21 MED ORDER — CYCLOBENZAPRINE HCL 10 MG PO TABS: 10.0000 mg | ORAL_TABLET | Freq: Two times a day (BID) | ORAL | Status: DC | PRN

## 2011-10-21 MED ORDER — OXYCODONE HCL 10 MG PO TABS: 10.0000 mg | ORAL_TABLET | Freq: Four times a day (QID) | ORAL | Status: DC | PRN

## 2011-10-21 MED ORDER — FENTANYL 50 MCG/HR TD PT72: 1.0000 | MEDICATED_PATCH | TRANSDERMAL | Status: DC

## 2011-10-21 NOTE — Progress Notes (Signed)
Subjective:    Patient ID: [name], female    DOB: Mar 07, 1955, 56 y.o.   MRN: [id number]  HPI The patient is a morbidly obese, 56 year old female female, who presents with chronic shoulder , knee and LBP . Patient denies any radiating symptoms. Medications alleviate the symptoms some. Patient reports that she needs a total knee replacement on the right but she would have to loose at least 120 pounds before the surgery could be performed.Doing quite well today. She reports, that she has substitute half of the sugar she puts in her tea with Stevia. She also reports, that she is eating much healthier now, and less calories. She states that she has gained back the 2 lbs. she had lost last month, she is a little frustrated about this.   Pain Inventory Average Pain 7 Pain Right Now 8 My pain is sharp, burning, dull, stabbing, tingling and aching  In the last 24 hours, has pain interfered with the following? General activity 10 Relation with others 10 Enjoyment of life 10 What TIME of day is your pain at its worst? evening Sleep (in general) Fair  Pain is worse with: walking, bending, sitting and standing Pain improves with: medication Relief from Meds: 5  Mobility ability to climb steps?  no do you drive?  no use a wheelchair  Function I need assistance with the following:  dressing, bathing, toileting, meal prep, household duties and shopping  Neuro/Psych No problems in this area  Prior Studies Any changes since last visit?  no  Physicians involved in your care Any changes since last visit?  no   Family History  Problem Relation Age of Onset  . Asthma Mother   . COPD Mother   . Heart disease Mother   . Heart disease Father    History   Social History  . Marital Status: Married    Spouse Name: N/A    Number of Children: N/A  . Years of Education: N/A   Social History Main Topics  . Smoking status: Never Smoker   . Smokeless tobacco: Never Used  . Alcohol  Use: None  . Drug Use: None  . Sexually Active: None   Other Topics Concern  . None   Social History Narrative  . None   Past Surgical History  Procedure Date  . Abdominal hysterectomy   . Cesarean section     x2  . Cholecystectomy    Past Medical History  Diagnosis Date  . Hypertension   . Thoracic or lumbosacral neuritis or radiculitis, unspecified   . Lumbago   . Obesity   . Primary localized osteoarthrosis, lower leg   . Facet syndrome, lumbar   . Neuromuscular disorder   . GERD (gastroesophageal reflux disease)   . Depression   . Anxiety    BP 180/68  Pulse 88  Resp 14  Wt 412 lb 6.4 oz (187.063 kg)  SpO2 94%    Review of Systems  Musculoskeletal: Positive for back pain and gait problem.       Joint pain  All other systems reviewed and are negative.       Objective:   Physical Exam Constitutional: She is oriented to person, place, and time. She appears well-developed.  Morbidly obese in wheel chair  HENT:  Head: Normocephalic.  Eyes: Pupils are equal, round, and reactive to light.  Neck: Normal range of motion.  Neurological: She is alert and oriented to person, place, and time.  Skin: Skin is  warm and dry.  Psychiatric: She has a normal mood and affect.  Symmetric normal motor tone is noted throughout. Normal muscle bulk. Muscle testing reveals 5/5 muscle strength of the upper extremity, and 5/5 of the lower extremity, except iliopsoas on the right3/5, on the left 2+. Testing was limited, because patient is in wheel chair, and weights over 400lbs.  DTR in the upper and lower extremity are present and symmetric 2+. No clonus is noted.        Assessment & Plan:  56 year old morbidly obese female with a history of chronic low back pain, osteoarthritis on the right knee, knee pain and right shoulder pain after rotator cuff injury.  Discussed with the patient that she should try to lose weight. Made some suggestions that she should substitute chips  with vegetables and fruits. She states that she has replaced half the shuger for her tea with Stevia, and that she is more motivated to loose weight, since an old friend stated that she will support the patient doing this.She also reports, that her daughter is helping her to eat more healthy and less , unfortunately she has regained the 2 lbs she lost last month. I encouraged her to continue with her diet and exercise program.I referred her to a nutritionist, to help her with her diet.We discussed bariatric surgery, but the patient states, that this would be too expensive, I thought, that the insurance would pay for this surgery , if the BMI is above 40, I will look into this . Reordered patient's pain medication, and flexeril. Advised patient to follow up with her PCP, concerning her increased BP, the patient stated that she ran out off her BP medication, and has not taken any for 2 days. I educated her that she should always make sure that she is taking her BP medication, and educated her about the risks if she does not take it.  The patient should follow up in one month.

## 2011-10-21 NOTE — Patient Instructions (Signed)
Follow up with your PCP for your high BP, continue with trying to loose weight, follow up with nutritionist.

## 2011-11-18 ENCOUNTER — Encounter: Payer: Medicare Other | Attending: Physical Medicine & Rehabilitation | Admitting: Physical Medicine & Rehabilitation

## 2011-11-18 ENCOUNTER — Encounter: Payer: Self-pay | Admitting: Physical Medicine & Rehabilitation

## 2011-11-18 VITALS — BP 125/55 | HR 73 | Resp 14 | Ht 67.0 in | Wt >= 6400 oz

## 2011-11-18 DIAGNOSIS — M1710 Unilateral primary osteoarthritis, unspecified knee: Secondary | ICD-10-CM

## 2011-11-18 DIAGNOSIS — M67919 Unspecified disorder of synovium and tendon, unspecified shoulder: Secondary | ICD-10-CM | POA: Insufficient documentation

## 2011-11-18 DIAGNOSIS — IMO0002 Reserved for concepts with insufficient information to code with codable children: Secondary | ICD-10-CM | POA: Insufficient documentation

## 2011-11-18 DIAGNOSIS — M719 Bursopathy, unspecified: Secondary | ICD-10-CM | POA: Insufficient documentation

## 2011-11-18 DIAGNOSIS — M75101 Unspecified rotator cuff tear or rupture of right shoulder, not specified as traumatic: Secondary | ICD-10-CM

## 2011-11-18 DIAGNOSIS — M47817 Spondylosis without myelopathy or radiculopathy, lumbosacral region: Secondary | ICD-10-CM | POA: Insufficient documentation

## 2011-11-18 DIAGNOSIS — M47816 Spondylosis without myelopathy or radiculopathy, lumbar region: Secondary | ICD-10-CM

## 2011-11-18 MED ORDER — FENTANYL 50 MCG/HR TD PT72: 1.0000 | MEDICATED_PATCH | TRANSDERMAL | Status: DC

## 2011-11-18 MED ORDER — OXYCODONE HCL 10 MG PO TABS: 10.0000 mg | ORAL_TABLET | Freq: Four times a day (QID) | ORAL | Status: DC | PRN

## 2011-11-18 NOTE — Patient Instructions (Signed)
Continue with your diet and increased activity. YOU ARE DOING A GREAT JOB!  KEEP IT UP!

## 2011-11-18 NOTE — Progress Notes (Signed)
Subjective:    Patient ID: [name], female    DOB: 1955/03/29, 56 y.o.   MRN: [id number]  HPI  Mrs. Mckinney is back regarding her chronic back and knee pain. She continues to struggle with her weight and despite dieting is not losing weight. She is trying to move more and get outside of the house. She hasn't fallen again since i last saw her.   She hast tried to change her perspective on life and has been more active and interactive in general in addition to her bed.  She finds that the voltaren gel is helpful for her knees and right shoulder. She uses it 2 or 3 times per day.   She has been using a fiber supplement to lose weight.       Pain Inventory Average Pain 7 Pain Right Now 7 My pain is intermittent, sharp, burning, dull, stabbing, tingling and aching  In the last 24 hours, has pain interfered with the following? General activity 10 Relation with others 10 Enjoyment of life 10 What TIME of day is your pain at its worst? morning and evening Sleep (in general) Fair  Pain is worse with: walking, bending, sitting and standing Pain improves with: rest and medication Relief from Meds: 5  Mobility use a walker ability to climb steps?  no do you drive?  no use a wheelchair  Function disabled: date disabled  I need assistance with the following:  dressing, bathing, toileting, meal prep, household duties and shopping  Neuro/Psych bladder control problems bowel control problems weakness numbness tremor tingling trouble walking spasms dizziness confusion  Prior Studies Any changes since last visit?  no  Physicians involved in your care Any changes since last visit?  no   Family History  Problem Relation Age of Onset  . Asthma Mother   . COPD Mother   . Heart disease Mother   . Heart disease Father    History   Social History  . Marital Status: Married    Spouse Name: N/A    Number of Children: N/A  . Years of Education: N/A   Social  History Main Topics  . Smoking status: Never Smoker   . Smokeless tobacco: Never Used  . Alcohol Use: None  . Drug Use: None  . Sexually Active: None   Other Topics Concern  . None   Social History Narrative  . None   Past Surgical History  Procedure Date  . Abdominal hysterectomy   . Cesarean section     x2  . Cholecystectomy    Past Medical History  Diagnosis Date  . Hypertension   . Thoracic or lumbosacral neuritis or radiculitis, unspecified   . Lumbago   . Obesity   . Primary localized osteoarthrosis, lower leg   . Facet syndrome, lumbar   . Neuromuscular disorder   . GERD (gastroesophageal reflux disease)   . Depression   . Anxiety    BP 125/55  Pulse 73  Resp 14  Ht 5\' 7"  (1.702 m)  Wt 412 lb (186.882 kg)  BMI 64.53 kg/m2  SpO2 94%    Review of Systems  Musculoskeletal: Positive for back pain, arthralgias and gait problem.  Neurological: Positive for dizziness, tremors, weakness and numbness.       Tingling  Psychiatric/Behavioral: Positive for confusion.  All other systems reviewed and are negative.       Objective:   Physical Exam Constitutional: She is oriented to person, place, and time. She appears well-developed and  well-nourished.  Morbidly obese  HENT:  Head: Normocephalic.  Eyes: Conjunctivae and EOM are normal. Pupils are equal, round, and reactive to light.  Neck: Normal range of motion.  Pulmonary/Chest: Effort normal.  Abdominal: Soft.  Musculoskeletal:  Lumbar back: She exhibits decreased range of motion and tenderness.  Pain with palpation over the right subacromial space as well with ER and IR of the shoulder. RTC inpingement maneuvers were positive. She has joint line tenderness at the right knee with effusions. Positive crepitus. She poorly tolerates ROM at the right knee.  Neurological: She is alert and oriented to person, place, and time.  Skin: Skin is warm and dry.  Psychiatric: She has a normal mood and affect. Affect  as a whole is much brighter today   Assessment & Plan:   ASSESSMENT:  1. Chronic lumbar pain, facet arthropathy.  2. Bilateral knee osteoarthritis.  3. Bursitis of right shoulder.  4. Morbid obesity.   PLAN:  1. Refilled fentanyl 50 mcg 1 transdermally every 72 hours, 10, with no  Refill.  2. Oxycodone 10 mg 1 p.o. q.6 hours p.r.n., 90, with no refill.  3. I am encouraged by her efforts over the summer. We discussed a few adjustments to her diet, activity, supplements, etc. We discussed the fact that this is a long term process 4. F/u in one month with my PA. No other refills were needed.

## 2011-11-28 ENCOUNTER — Emergency Department (HOSPITAL_COMMUNITY): Payer: Medicare Other

## 2011-11-28 ENCOUNTER — Emergency Department (HOSPITAL_COMMUNITY)
Admission: EM | Admit: 2011-11-28 | Discharge: 2011-11-29 | Disposition: A | Discharge status: Home / Self Care | Payer: Medicare Other | Attending: Emergency Medicine | Admitting: Emergency Medicine

## 2011-11-28 ENCOUNTER — Encounter (HOSPITAL_COMMUNITY): Payer: Self-pay | Admitting: Emergency Medicine

## 2011-11-28 DIAGNOSIS — IMO0001 Reserved for inherently not codable concepts without codable children: Secondary | ICD-10-CM

## 2011-11-28 DIAGNOSIS — I10 Essential (primary) hypertension: Secondary | ICD-10-CM | POA: Insufficient documentation

## 2011-11-28 DIAGNOSIS — Z8739 Personal history of other diseases of the musculoskeletal system and connective tissue: Secondary | ICD-10-CM | POA: Insufficient documentation

## 2011-11-28 DIAGNOSIS — Z8669 Personal history of other diseases of the nervous system and sense organs: Secondary | ICD-10-CM | POA: Insufficient documentation

## 2011-11-28 DIAGNOSIS — IMO0002 Reserved for concepts with insufficient information to code with codable children: Secondary | ICD-10-CM | POA: Insufficient documentation

## 2011-11-28 DIAGNOSIS — Y9389 Activity, other specified: Secondary | ICD-10-CM | POA: Insufficient documentation

## 2011-11-28 DIAGNOSIS — Y929 Unspecified place or not applicable: Secondary | ICD-10-CM | POA: Insufficient documentation

## 2011-11-28 DIAGNOSIS — K219 Gastro-esophageal reflux disease without esophagitis: Secondary | ICD-10-CM | POA: Insufficient documentation

## 2011-11-28 DIAGNOSIS — F411 Generalized anxiety disorder: Secondary | ICD-10-CM | POA: Insufficient documentation

## 2011-11-28 DIAGNOSIS — S92253A Displaced fracture of navicular [scaphoid] of unspecified foot, initial encounter for closed fracture: Secondary | ICD-10-CM | POA: Insufficient documentation

## 2011-11-28 MED ORDER — OXYCODONE-ACETAMINOPHEN 5-325 MG PO TABS
1.0000 | ORAL_TABLET | Freq: Once | ORAL | Status: AC
  Administered 2011-11-28: 1 via ORAL
  Filled 2011-11-28: qty 1

## 2011-11-28 NOTE — ED Provider Notes (Signed)
History    CSN: [id number] Arrival date & time 11/28/11  1803 First MD Initiated Contact with Patient 11/28/11 1927     Chief Complaint  Patient presents with  . Foot Injury   HPI The patient was using her motorized wheelchair and ran into a wall injuring her left foot. Patient normally does not ambulate but does transfer. She uses a motorized chair to get around.  Patient also is having some pain in her left knee. She denies any other injuries. Past Medical History  Diagnosis Date  . Hypertension   . Thoracic or lumbosacral neuritis or radiculitis, unspecified   . Lumbago   . Obesity   . Primary localized osteoarthrosis, lower leg   . Facet syndrome, lumbar   . Neuromuscular disorder   . GERD (gastroesophageal reflux disease)   . Depression   . Anxiety     Past Surgical History  Procedure Date  . Abdominal hysterectomy   . Cesarean section     x2  . Cholecystectomy     Family History  Problem Relation Age of Onset  . Asthma Mother   . COPD Mother   . Heart disease Mother   . Heart disease Father     History  Substance Use Topics  . Smoking status: Never Smoker   . Smokeless tobacco: Never Used  . Alcohol Use: Not on file    OB History    Grav Para Term Preterm Abortions TAB SAB Ect Mult Living                  Review of Systems  All other systems reviewed and are negative.    Allergies  Sulfa antibiotics  Home Medications   Current Outpatient Rx  Name Route Sig Dispense Refill  . ACETAMINOPHEN 500 MG PO TABS Oral Take 1,000 mg by mouth every 6 (six) hours as needed. For pain.    Marland Kitchen ALPRAZOLAM 1 MG PO TABS Oral Take 1 mg by mouth 4 (four) times daily as needed. For anxiety.    Marland Kitchen CITALOPRAM HYDROBROMIDE 40 MG PO TABS Oral Take 40 mg by mouth daily.    Marland Kitchen CLINDAMYCIN HCL 300 MG PO CAPS Oral Take 300 mg by mouth 2 (two) times daily as needed. MRSA    . CYCLOBENZAPRINE HCL 10 MG PO TABS Oral Take 1 tablet (10 mg total) by mouth 2 (two) times daily as  needed for muscle spasms. 60 tablet 0  . DICLOFENAC SODIUM 1 % TD GEL Topical Apply 1 application topically 4 (four) times daily as needed. For joint pain.    Marland Kitchen DOXYCYCLINE HYCLATE 100 MG PO CAPS Oral Take 100 mg by mouth 2 (two) times daily as needed. MRSA    . ESTROGENS CONJUGATED 0.625 MG PO TABS Oral Take 0.625 mg by mouth daily. Take daily for 21 days then do not take for 7 days.    . FENTANYL 50 MCG/HR TD PT72 Transdermal Place 1 patch (50 mcg total) onto the skin every 3 (three) days. 10 patch 0  . FLUTICASONE-SALMETEROL 500-50 MCG/DOSE IN AEPB Inhalation Inhale 1 puff into the lungs every 12 (twelve) hours.    Marland Kitchen GABAPENTIN 300 MG PO CAPS Oral Take 1 capsule (300 mg total) by mouth 3 (three) times daily. 270 capsule 0  . HYDRALAZINE HCL 25 MG PO TABS Oral Take 25 mg by mouth daily.    Marland Kitchen MONTELUKAST SODIUM 10 MG PO TABS Oral Take 10 mg by mouth daily.    Marland Kitchen OMEPRAZOLE 20  MG PO CPDR Oral Take 20 mg by mouth 2 (two) times daily.    . OXYCODONE HCL 10 MG PO TABS Oral Take 10 mg by mouth every 4 (four) hours as needed. For pain.    Marland Kitchen TIOTROPIUM BROMIDE MONOHYDRATE 18 MCG IN CAPS Inhalation Place 18 mcg into inhaler and inhale daily.      BP 108/72  Pulse 80  Temp 98.8 F (37.1 C) (Oral)  Resp 20  Wt 412 lb (186.882 kg)  SpO2 94%  Physical Exam  Nursing note and vitals reviewed. Constitutional: No distress.       Morbidly obese  HENT:  Head: Normocephalic and atraumatic.  Right Ear: External ear normal.  Left Ear: External ear normal.  Eyes: Conjunctivae normal are normal. Right eye exhibits no discharge. Left eye exhibits no discharge. No scleral icterus.  Neck: Neck supple. No tracheal deviation present.  Cardiovascular: Normal rate.   Pulmonary/Chest: Effort normal. No stridor. No respiratory distress.  Musculoskeletal: She exhibits no edema.       Left knee: She exhibits no swelling and no effusion. tenderness (mild tenderness patella) found.       Left ankle: She exhibits  swelling and deformity. tenderness. No lateral malleolus and no medial malleolus tenderness found.  Neurological: She is alert. Cranial nerve deficit: no gross deficits.  Skin: Skin is warm and dry. No rash noted.  Psychiatric: She has a normal mood and affect.    ED Course  Procedures (including critical care time)  Labs Reviewed - No data to display Dg Knee Complete 4 Views Left  11/28/2011  *RADIOLOGY REPORT*  Clinical Data: Left knee pain following injury today.  LEFT KNEE - COMPLETE 4+ VIEW  Comparison: None.  Findings: There is tricompartmental osteoarthritis with moderately advanced joint space loss and osteophyte formation in the medial compartment.  No acute fracture, dislocation or significant knee joint effusion is seen.  IMPRESSION: Tricompartmental osteoarthritis.  No acute osseous findings.   Original Report Authenticated By: Gerrianne Scale, M.D.    Dg Foot Complete Left  11/28/2011  *RADIOLOGY REPORT*  Clinical Data: Foot injury.  Pain.  LEFT FOOT - COMPLETE 3+ VIEW  Comparison: None.  Findings: There is significant soft tissue swelling in the forefoot and hind foot.  On the lateral view, there is a fracture of the tarsal navicular bone.  Additionally, the calcaneocuboid joint appears widened on multiple views, raising question of fracture or subluxation.  Further evaluation with CT may be helpful.  Note is made of osteopenia of the bones and small plantar spur.  IMPRESSION:  1.  Significant soft tissue swelling. 2.  Fracture of the navicular bone. 3.  Question of a fracture/subluxation of the calcaneocuboid. Consider further/evaluation with CT without contrast if needed.   Original Report Authenticated By: Patterson Hammersmith, M.D.      1. Navicular fracture       MDM  The patient appears to have a fracture of the tarsal navicular bone.  A CT scan was performed. Formal reading will be completed tomorrow by the musculoskeletal specialist radiologist. Preliminary findings do  not suggest any dislocation or serious injury. Patient mentions that she uses her walker to transfer from her bed to the commode. She has severe arthritis in her right knee associated with her morbid obesity. Orthopedic will not perform any knee surgery until she is able to lose weight. I will place her in a Cam Walker  since she will have difficulty transferring if she is placed in  a cast. Patient will use her walker to keep off the injured extremity        Celene Kras, MD 11/28/11 [phone]

## 2011-11-28 NOTE — ED Notes (Signed)
Patient ran into wall with motorized chair.  Injured left foot.  Some swelling, patient has limited movement.  Pt could not get out of chair to get into bed.  She had Advil and oxycodone before EMS arrived.

## 2011-11-28 NOTE — ED Notes (Signed)
Ortho tech called for cam walker 

## 2011-11-29 ENCOUNTER — Telehealth (HOSPITAL_COMMUNITY): Payer: Self-pay | Admitting: Emergency Medicine

## 2011-11-29 NOTE — ED Notes (Signed)
[facility] here for transport. 

## 2011-11-29 NOTE — ED Notes (Signed)
[facility] called for transport home, we were unable to get patient in the wheelchair to get her in the car

## 2011-11-29 NOTE — ED Notes (Signed)
VM rcvd from pt asking about an order that was supposed  to be called to [facility] for them to come to her house to have urinary catherization performed because she is unable to stand per pt.  Current Clinical research associate reviewed chart and had PA Chad review chart unable to find any documentation indicating this.  PA stated have to have face to face w/pt for referral to [facility] and she has not seen pt.  Pt notified and pt dissatisfied w/visit.  Transferred pt to Service Excellence Line for further assistance.

## 2011-11-30 ENCOUNTER — Telehealth: Payer: Self-pay

## 2011-11-30 NOTE — Telephone Encounter (Signed)
Broke left foot and is unable to use right leg.  She is concerned about getting around and getting home health.  Please call and advise.

## 2011-12-01 ENCOUNTER — Telehealth: Payer: Self-pay | Admitting: Physical Medicine & Rehabilitation

## 2011-12-01 NOTE — Telephone Encounter (Signed)
R leg/knee, can't stand.  Broke L foot.  Completely bedridden.  Cannot make 12/16/11 appointment.  Please call.

## 2011-12-01 NOTE — Telephone Encounter (Signed)
Previous message sent to Riley Kill to advise.

## 2011-12-02 ENCOUNTER — Telehealth: Payer: Self-pay | Admitting: *Deleted

## 2011-12-02 DIAGNOSIS — M47817 Spondylosis without myelopathy or radiculopathy, lumbosacral region: Secondary | ICD-10-CM

## 2011-12-02 DIAGNOSIS — M1710 Unilateral primary osteoarthritis, unspecified knee: Secondary | ICD-10-CM

## 2011-12-02 DIAGNOSIS — M719 Bursopathy, unspecified: Secondary | ICD-10-CM

## 2011-12-02 NOTE — Telephone Encounter (Signed)
[name] called back saying she is in a desperate situation after breaking her foot. Since she cannot walk on her right leg she has basically been bedridden. She will not be able to make the 12/16/11 appointment with Dr Riley Kill. She cannot even get to her orthopedic office appointment about her foot becaue the only way to transport her is through [facility] and it costs almost $1000 and they require half of that up front and she just does not have it.  She cannot get up and needs to have some help at home and is requesting home health.  I called Di back after discussing with Su Monks PA (since Dr Riley Kill is out of the office) to tell her that her orthopedic physician should be the one ordering the home health. She said she was scheduled to see Dr Rayburn Ma but has not yet seen him and had to cancel the appointment because of the transportation issue mentioned above. She was only seen by [facility] MD and sent home in ambulance. She has some sort of brace she was given and that is the only thing on her foot at this time.

## 2011-12-02 NOTE — Telephone Encounter (Signed)
The orthopedic surgeon should order home health, we do not even know what kind of fracture she has, which means we could not even put a diagnosis on the order, she might need a CAM-walker, and crutches. Also the home health PT/nurse needs to know how stable the fracture is. She should call her orthopedic surgeon and describe her situation.

## 2011-12-02 NOTE — Telephone Encounter (Signed)
[facility] notified that [facility] has been ordered.

## 2011-12-02 NOTE — Telephone Encounter (Signed)
I talked with [name] and explained to her the challenges we were having to get her home care. Because of her being medicare only there had to be a skilled service in the home in order to have the visits covered by medicare, and because we have not seen her for this and she has yet to be evaluated by Dr Rayburn Ma and it would be unsafe to have her evaluated by PT until the foot is stablized by orthopedics. That leaves her options limited. [facility] gave [name] some numbers of home aid services that would cast approx $16/hr that she could arrange a couple of hours a day to help her with personal care needs.  Otherwise I encouraged her to call [facility] and see if she could reach a Child psychotherapist who could advise her of her options regarding transportation and care needs, since she was sent home from the  ER unprepared to manage this situation.

## 2011-12-14 ENCOUNTER — Telehealth: Payer: Self-pay | Admitting: Physical Medicine & Rehabilitation

## 2011-12-14 DIAGNOSIS — M47816 Spondylosis without myelopathy or radiculopathy, lumbar region: Secondary | ICD-10-CM

## 2011-12-14 DIAGNOSIS — M1710 Unilateral primary osteoarthritis, unspecified knee: Secondary | ICD-10-CM

## 2011-12-14 DIAGNOSIS — M75101 Unspecified rotator cuff tear or rupture of right shoulder, not specified as traumatic: Secondary | ICD-10-CM

## 2011-12-14 NOTE — Telephone Encounter (Signed)
Foot broken, can daughter pick up rx's?

## 2011-12-15 ENCOUNTER — Telehealth: Payer: Self-pay | Admitting: Family Medicine

## 2011-12-15 MED ORDER — OXYCODONE HCL 10 MG PO TABS: 10.0000 mg | ORAL_TABLET | Freq: Four times a day (QID) | ORAL | Status: DC | PRN

## 2011-12-15 MED ORDER — FENTANYL 50 MCG/HR TD PT72: 1.0000 | MEDICATED_PATCH | TRANSDERMAL | Status: DC

## 2011-12-15 NOTE — Telephone Encounter (Signed)
Called patient to schedule Medical Nutrtition Therapy appt.  Pt broke her foot last week, so cannot go anywhere at this time.  Provided her with my phone number, & encouraged her to call when able to schedule.

## 2011-12-15 NOTE — Telephone Encounter (Signed)
RX's printed for Clydie Braun to sign.

## 2011-12-15 NOTE — Telephone Encounter (Signed)
[name] aware that her daughter can pick up rxs

## 2011-12-16 ENCOUNTER — Encounter: Payer: Medicare Other | Admitting: Physical Medicine and Rehabilitation

## 2012-01-10 ENCOUNTER — Other Ambulatory Visit: Payer: Self-pay | Admitting: Physical Medicine & Rehabilitation

## 2012-01-10 ENCOUNTER — Other Ambulatory Visit: Payer: Self-pay | Admitting: *Deleted

## 2012-01-10 DIAGNOSIS — M47816 Spondylosis without myelopathy or radiculopathy, lumbar region: Secondary | ICD-10-CM

## 2012-01-10 DIAGNOSIS — M1710 Unilateral primary osteoarthritis, unspecified knee: Secondary | ICD-10-CM

## 2012-01-10 DIAGNOSIS — M75101 Unspecified rotator cuff tear or rupture of right shoulder, not specified as traumatic: Secondary | ICD-10-CM

## 2012-01-10 MED ORDER — FENTANYL 50 MCG/HR TD PT72: 1.0000 | MEDICATED_PATCH | TRANSDERMAL | Status: DC

## 2012-01-10 MED ORDER — OXYCODONE HCL 10 MG PO TABS: 10.0000 mg | ORAL_TABLET | Freq: Four times a day (QID) | ORAL | Status: DC | PRN

## 2012-01-10 NOTE — Telephone Encounter (Signed)
rx printed for Clydie Braun to sign

## 2012-01-10 NOTE — Telephone Encounter (Signed)
Foot still broken.  Can she p/u up Rx again this month?

## 2012-01-11 NOTE — Telephone Encounter (Signed)
Notified [facility] available for pick up

## 2012-02-09 ENCOUNTER — Telehealth: Payer: Self-pay | Admitting: *Deleted

## 2012-02-09 DIAGNOSIS — M75101 Unspecified rotator cuff tear or rupture of right shoulder, not specified as traumatic: Secondary | ICD-10-CM

## 2012-02-09 DIAGNOSIS — M47816 Spondylosis without myelopathy or radiculopathy, lumbar region: Secondary | ICD-10-CM

## 2012-02-09 DIAGNOSIS — M1710 Unilateral primary osteoarthritis, unspecified knee: Secondary | ICD-10-CM

## 2012-02-09 MED ORDER — FENTANYL 50 MCG/HR TD PT72: 1.0000 | MEDICATED_PATCH | TRANSDERMAL | Status: AC

## 2012-02-09 MED ORDER — OXYCODONE HCL 10 MG PO TABS: 10.0000 mg | ORAL_TABLET | Freq: Four times a day (QID) | ORAL | Status: DC | PRN

## 2012-02-09 NOTE — Telephone Encounter (Signed)
[facility] called and has not been able to get out of bed since she broke her foot in October.  She was not able to see Dr Rayburn Ma as [facility] referred her to do because of her mobility issue.  [facility] has been in contact now and is saying they are going to get with her PCP to get [facility] to go out and assess her. She has just tried on her own to get up and is so dizzy and pain in her foot that she has not been successful.  She will need to cancel her appointment on Monday and pick up her rx's. Printed for Dr Riley Kill to sign.

## 2012-02-10 NOTE — Telephone Encounter (Signed)
Notified [name] her rx is ready

## 2012-02-13 ENCOUNTER — Encounter: Payer: Medicare Other | Admitting: Physical Medicine & Rehabilitation

## 2012-02-19 ENCOUNTER — Inpatient Hospital Stay (HOSPITAL_COMMUNITY): Payer: Medicare Other

## 2012-02-19 ENCOUNTER — Encounter (HOSPITAL_COMMUNITY): Payer: Self-pay | Admitting: *Deleted

## 2012-02-19 ENCOUNTER — Emergency Department (HOSPITAL_COMMUNITY): Payer: Medicare Other

## 2012-02-19 ENCOUNTER — Inpatient Hospital Stay (HOSPITAL_COMMUNITY)
Admission: EM | Admit: 2012-02-19 | Discharge: 2012-02-22 | DRG: 312 | Disposition: A | Discharge status: Skilled Nursing Facility | Payer: Medicare Other | Attending: Internal Medicine | Admitting: Internal Medicine

## 2012-02-19 DIAGNOSIS — J45909 Unspecified asthma, uncomplicated: Secondary | ICD-10-CM | POA: Diagnosis present

## 2012-02-19 DIAGNOSIS — F1120 Opioid dependence, uncomplicated: Secondary | ICD-10-CM | POA: Diagnosis present

## 2012-02-19 DIAGNOSIS — M179 Osteoarthritis of knee, unspecified: Secondary | ICD-10-CM

## 2012-02-19 DIAGNOSIS — Z6841 Body Mass Index (BMI) 40.0 and over, adult: Secondary | ICD-10-CM

## 2012-02-19 DIAGNOSIS — R5381 Other malaise: Secondary | ICD-10-CM

## 2012-02-19 DIAGNOSIS — I498 Other specified cardiac arrhythmias: Secondary | ICD-10-CM | POA: Diagnosis present

## 2012-02-19 DIAGNOSIS — M79609 Pain in unspecified limb: Secondary | ICD-10-CM | POA: Diagnosis present

## 2012-02-19 DIAGNOSIS — I10 Essential (primary) hypertension: Secondary | ICD-10-CM | POA: Diagnosis present

## 2012-02-19 DIAGNOSIS — F411 Generalized anxiety disorder: Secondary | ICD-10-CM | POA: Diagnosis present

## 2012-02-19 DIAGNOSIS — F3289 Other specified depressive episodes: Secondary | ICD-10-CM | POA: Diagnosis present

## 2012-02-19 DIAGNOSIS — R55 Syncope and collapse: Principal | ICD-10-CM

## 2012-02-19 DIAGNOSIS — M47816 Spondylosis without myelopathy or radiculopathy, lumbar region: Secondary | ICD-10-CM

## 2012-02-19 DIAGNOSIS — E662 Morbid (severe) obesity with alveolar hypoventilation: Secondary | ICD-10-CM | POA: Diagnosis present

## 2012-02-19 DIAGNOSIS — K5900 Constipation, unspecified: Secondary | ICD-10-CM

## 2012-02-19 DIAGNOSIS — M1710 Unilateral primary osteoarthritis, unspecified knee: Secondary | ICD-10-CM

## 2012-02-19 DIAGNOSIS — M75101 Unspecified rotator cuff tear or rupture of right shoulder, not specified as traumatic: Secondary | ICD-10-CM

## 2012-02-19 DIAGNOSIS — F329 Major depressive disorder, single episode, unspecified: Secondary | ICD-10-CM | POA: Diagnosis present

## 2012-02-19 DIAGNOSIS — R609 Edema, unspecified: Secondary | ICD-10-CM | POA: Diagnosis present

## 2012-02-19 DIAGNOSIS — R079 Chest pain, unspecified: Secondary | ICD-10-CM

## 2012-02-19 LAB — COMPREHENSIVE METABOLIC PANEL
AST: 14 U/L (ref 0–37)
CO2: 30 mEq/L (ref 19–32)
Calcium: 9 mg/dL (ref 8.4–10.5)
Chloride: 96 mEq/L (ref 96–112)
Creatinine, Ser: 0.67 mg/dL (ref 0.50–1.10)
GFR calc Af Amer: >90 mL/min (ref >90)
GFR calc non Af Amer: >90 mL/min (ref >90)
Glucose, Bld: 107 mg/dL — ABNORMAL HIGH (ref 70–99)
Total Bilirubin: 0.3 mg/dL (ref 0.3–1.2)

## 2012-02-19 LAB — RAPID URINE DRUG SCREEN, HOSP PERFORMED
Amphetamines: NOT DETECTED
Cocaine: NOT DETECTED
Opiates: NOT DETECTED
Tetrahydrocannabinol: NOT DETECTED

## 2012-02-19 LAB — CBC WITH DIFFERENTIAL/PLATELET
Basophils Absolute: 0 10*3/uL (ref 0.0–0.1)
Eosinophils Relative: 4 % (ref 0–5)
HCT: 44 % (ref 36.0–46.0)
Hemoglobin: 13.7 g/dL (ref 12.0–15.0)
Lymphocytes Relative: 22 % (ref 12–46)
Lymphs Abs: 2.3 10*3/uL (ref 0.7–4.0)
MCV: 76.1 fL — ABNORMAL LOW (ref 78.0–100.0)
Monocytes Absolute: 0.9 10*3/uL (ref 0.1–1.0)
Monocytes Relative: 9 % (ref 3–12)
Neutro Abs: 6.9 10*3/uL (ref 1.7–7.7)
RBC: 5.78 MIL/uL — ABNORMAL HIGH (ref 3.87–5.11)
RDW: 18.1 % — ABNORMAL HIGH (ref 11.5–15.5)
WBC: 10.4 10*3/uL (ref 4.0–10.5)

## 2012-02-19 LAB — URINALYSIS, MICROSCOPIC ONLY
Bilirubin Urine: NEGATIVE
Hgb urine dipstick: NEGATIVE
Leukocytes, UA: NEGATIVE
Nitrite: NEGATIVE
Specific Gravity, Urine: 1.021 (ref 1.005–1.030)
Urine-Other: NONE SEEN

## 2012-02-19 LAB — TROPONIN I: Troponin I: <0.3 ng/mL (ref <0.30)

## 2012-02-19 MED ORDER — MONTELUKAST SODIUM 10 MG PO TABS
10.0000 mg | ORAL_TABLET | Freq: Every day | ORAL | Status: DC
  Administered 2012-02-19 – 2012-02-22 (×4): 10 mg via ORAL
  Filled 2012-02-19 (×4): qty 1

## 2012-02-19 MED ORDER — ONDANSETRON HCL 4 MG/2ML IJ SOLN: 4.0000 mg | Freq: Four times a day (QID) | INTRAMUSCULAR | Status: DC | PRN

## 2012-02-19 MED ORDER — ALPRAZOLAM 0.5 MG PO TABS
1.0000 mg | ORAL_TABLET | Freq: Once | ORAL | Status: AC
  Administered 2012-02-19: 1 mg via ORAL
  Filled 2012-02-19: qty 2

## 2012-02-19 MED ORDER — PNEUMOCOCCAL VAC POLYVALENT 25 MCG/0.5ML IJ INJ
0.5000 mL | INJECTION | INTRAMUSCULAR | Status: AC
  Administered 2012-02-20: 0.5 mL via INTRAMUSCULAR
  Filled 2012-02-19 (×2): qty 0.5

## 2012-02-19 MED ORDER — SENNA 8.6 MG PO TABS
2.0000 | ORAL_TABLET | Freq: Every day | ORAL | Status: DC
  Administered 2012-02-19 – 2012-02-21 (×3): 17.2 mg via ORAL
  Filled 2012-02-19 (×2): qty 2
  Filled 2012-02-19: qty 1

## 2012-02-19 MED ORDER — FENTANYL 50 MCG/HR TD PT72
50.0000 ug | MEDICATED_PATCH | TRANSDERMAL | Status: DC
  Administered 2012-02-20: 50 ug via TRANSDERMAL
  Filled 2012-02-19: qty 1

## 2012-02-19 MED ORDER — MOMETASONE FURO-FORMOTEROL FUM 200-5 MCG/ACT IN AERO
2.0000 | INHALATION_SPRAY | Freq: Two times a day (BID) | RESPIRATORY_TRACT | Status: DC
  Administered 2012-02-19 – 2012-02-22 (×6): 2 via RESPIRATORY_TRACT
  Filled 2012-02-19: qty 8.8

## 2012-02-19 MED ORDER — OXYCODONE HCL 5 MG PO TABS
10.0000 mg | ORAL_TABLET | Freq: Four times a day (QID) | ORAL | Status: DC | PRN
  Administered 2012-02-20 – 2012-02-22 (×4): 10 mg via ORAL
  Filled 2012-02-19 (×4): qty 2

## 2012-02-19 MED ORDER — ACETAMINOPHEN 500 MG PO TABS: 1000.0000 mg | ORAL_TABLET | Freq: Four times a day (QID) | ORAL | Status: DC | PRN

## 2012-02-19 MED ORDER — ALPRAZOLAM 1 MG PO TABS: 1.0000 mg | ORAL_TABLET | Freq: Three times a day (TID) | ORAL | Status: DC | PRN

## 2012-02-19 MED ORDER — DOCUSATE SODIUM 100 MG PO CAPS
100.0000 mg | ORAL_CAPSULE | Freq: Two times a day (BID) | ORAL | Status: DC
  Administered 2012-02-19 – 2012-02-21 (×5): 100 mg via ORAL
  Filled 2012-02-19 (×7): qty 1

## 2012-02-19 MED ORDER — MAGNESIUM CITRATE PO SOLN
1.0000 | Freq: Once | ORAL | Status: AC
  Administered 2012-02-19: 1 via ORAL

## 2012-02-19 MED ORDER — CITALOPRAM HYDROBROMIDE 40 MG PO TABS
40.0000 mg | ORAL_TABLET | Freq: Every day | ORAL | Status: DC
  Administered 2012-02-19 – 2012-02-22 (×4): 40 mg via ORAL
  Filled 2012-02-19 (×4): qty 1

## 2012-02-19 MED ORDER — ONDANSETRON HCL 4 MG PO TABS: 4.0000 mg | ORAL_TABLET | Freq: Four times a day (QID) | ORAL | Status: DC | PRN

## 2012-02-19 MED ORDER — SODIUM CHLORIDE 0.9 % IV SOLN
INTRAVENOUS | Status: DC
  Administered 2012-02-20: 13:00:00 via INTRAVENOUS

## 2012-02-19 MED ORDER — TIOTROPIUM BROMIDE MONOHYDRATE 18 MCG IN CAPS
18.0000 ug | ORAL_CAPSULE | Freq: Every day | RESPIRATORY_TRACT | Status: DC
  Administered 2012-02-20 – 2012-02-22 (×3): 18 ug via RESPIRATORY_TRACT
  Filled 2012-02-19: qty 5

## 2012-02-19 MED ORDER — ESTROGENS CONJUGATED 0.625 MG PO TABS
0.6250 mg | ORAL_TABLET | Freq: Every day | ORAL | Status: DC
Start: 2012-02-19 — End: 2012-02-22
  Administered 2012-02-19 – 2012-02-22 (×4): 0.625 mg via ORAL
  Filled 2012-02-19 (×4): qty 1

## 2012-02-19 MED ORDER — PANTOPRAZOLE SODIUM 40 MG PO TBEC
40.0000 mg | DELAYED_RELEASE_TABLET | Freq: Every day | ORAL | Status: DC
  Administered 2012-02-19 – 2012-02-22 (×4): 40 mg via ORAL
  Filled 2012-02-19 (×4): qty 1

## 2012-02-19 MED ORDER — GABAPENTIN 300 MG PO CAPS
300.0000 mg | ORAL_CAPSULE | Freq: Three times a day (TID) | ORAL | Status: DC
Start: 2012-02-19 — End: 2012-02-22
  Administered 2012-02-19 – 2012-02-22 (×9): 300 mg via ORAL
  Filled 2012-02-19 (×10): qty 1

## 2012-02-19 MED ORDER — ENOXAPARIN SODIUM 40 MG/0.4ML ~~LOC~~ SOLN
40.0000 mg | Freq: Every day | SUBCUTANEOUS | Status: DC
  Administered 2012-02-19: 40 mg via SUBCUTANEOUS
  Filled 2012-02-19 (×2): qty 0.4

## 2012-02-19 MED ORDER — INFLUENZA VIRUS VACC SPLIT PF IM SUSP
0.5000 mL | INTRAMUSCULAR | Status: AC
  Administered 2012-02-20: 0.5 mL via INTRAMUSCULAR
  Filled 2012-02-19 (×2): qty 0.5

## 2012-02-19 MED ORDER — ASPIRIN EC 81 MG PO TBEC
81.0000 mg | DELAYED_RELEASE_TABLET | Freq: Every day | ORAL | Status: DC
  Administered 2012-02-19 – 2012-02-22 (×4): 81 mg via ORAL
  Filled 2012-02-19 (×4): qty 1

## 2012-02-19 MED ORDER — LOSARTAN POTASSIUM 50 MG PO TABS
50.0000 mg | ORAL_TABLET | Freq: Every day | ORAL | Status: DC
  Administered 2012-02-19 – 2012-02-22 (×4): 50 mg via ORAL
  Filled 2012-02-19 (×4): qty 1

## 2012-02-19 MED ORDER — SODIUM CHLORIDE 0.9 % IJ SOLN
3.0000 mL | Freq: Two times a day (BID) | INTRAMUSCULAR | Status: DC
  Administered 2012-02-19 – 2012-02-21 (×3): 3 mL via INTRAVENOUS

## 2012-02-19 NOTE — ED Notes (Signed)
rn at bedside with md while discussing nursing home/rehab options with pt

## 2012-02-19 NOTE — ED Notes (Signed)
md at bedside

## 2012-02-19 NOTE — ED Notes (Signed)
YQM:[id number]<ON> Expected date:<BR> Expected time:<BR> Means of arrival:Ambulance<BR> Comments:<BR> 56yof-ankle pain/deformity

## 2012-02-19 NOTE — ED Provider Notes (Signed)
History     CSN: [id number]  Arrival date & time 02/19/12  1305   First MD Initiated Contact with Patient 02/19/12 1346      Chief Complaint  Patient presents with  . pain from ankle fracture   . Ankle Pain    (Consider location/radiation/quality/duration/timing/severity/associated sxs/prior treatment) HPI  This patient reports October 21 she was in her electric wheelchair and her sleeve got caught in the mechanism of the wheelchair which made her chair but her repeatedly into a door. She was seen in the ED later that same day and was found to have a fracture of the left foot. She states she was placed in a boot however she could not walk in it because it made her feel off-balance. 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 She reports that she was unable to get orthopedic referral because she cannot leave the house. She reports she has been in bed she fractured her foot in October. She reports she does not get out of bed to use the bathroom. They have a woman who comes twice a week to roll her over and cleans her bed. Patient is very upset and angry that she was discharged home in the ER in October. She is also angry at her PCP because several of her medications are no longer going to be refillable and he will not refill them until she comes to the office. She states she can't afford to hire an ambulance to take her to the doctor appointments. She states she had several grandsons at her house today and she decided that today was the day stand up out of bed for the first time since October. She states she stood up and she got dizzy and lightheaded and went down to the floor. She denies hitting her head. She states once she went down "I wasn't aware of anything".  She states it took them a long time and using several different chairs as a stepping area to get her back up in bed  PCP Dr Lysbeth Galas  Past Medical  History  Diagnosis Date  . Hypertension   . Thoracic or lumbosacral neuritis or radiculitis, unspecified   . Lumbago   . Obesity   . Primary localized osteoarthrosis, lower leg   . Facet syndrome, lumbar   . Neuromuscular disorder   . GERD (gastroesophageal reflux disease)   . Depression   . Anxiety     Past Surgical History  Procedure Date  . Abdominal hysterectomy   . Cesarean section     x2  . Cholecystectomy     Family History  Problem Relation Age of Onset  . Asthma Mother   . COPD Mother   . Heart disease Mother   . Heart disease Father     History  Substance Use Topics  . Smoking status: Never Smoker   . Smokeless tobacco: Never Used  . Alcohol Use: Not on file  Lives at home Lives with daughter   OB History    Grav Para Term Preterm Abortions TAB SAB Ect Mult Living                  Review of Systems  All other systems reviewed and are negative.    Allergies  Sulfa antibiotics  Home Medications   Current Outpatient Rx  Name  Route  Sig  Dispense  Refill  . ACETAMINOPHEN 500 MG  PO TABS   Oral   Take 1,000 mg by mouth every 6 (six) hours as needed. For pain.         Marland Kitchen ALPRAZOLAM 1 MG PO TABS   Oral   Take 1 mg by mouth 4 (four) times daily as needed. For anxiety.         Marland Kitchen CITALOPRAM HYDROBROMIDE 40 MG PO TABS   Oral   Take 40 mg by mouth daily.         . CYCLOBENZAPRINE HCL 10 MG PO TABS   Oral   Take 1 tablet (10 mg total) by mouth 2 (two) times daily as needed for muscle spasms.   60 tablet   0   . DICLOFENAC SODIUM 1 % TD GEL   Topical   Apply 1 application topically 4 (four) times daily as needed. For joint pain.         Marland Kitchen ESTROGENS CONJUGATED 0.625 MG PO TABS   Oral   Take 0.625 mg by mouth daily. Take daily for 21 days then do not take for 7 days.         Marland Kitchen FLUTICASONE-SALMETEROL 500-50 MCG/DOSE IN AEPB   Inhalation   Inhale 1 puff into the lungs every 12 (twelve) hours.         Marland Kitchen GABAPENTIN 300 MG PO  CAPS   Oral   Take 1 capsule (300 mg total) by mouth 3 (three) times daily.   270 capsule   0   . LOSARTAN POTASSIUM 50 MG PO TABS   Oral   Take 50 mg by mouth daily.         Marland Kitchen MONTELUKAST SODIUM 10 MG PO TABS   Oral   Take 10 mg by mouth daily.         Marland Kitchen OMEPRAZOLE 20 MG PO CPDR   Oral   Take 20 mg by mouth 2 (two) times daily.         . OXYCODONE HCL 10 MG PO TABS   Oral   Take 1 tablet (10 mg total) by mouth 4 (four) times daily as needed.   90 tablet   0   . TIOTROPIUM BROMIDE MONOHYDRATE 18 MCG IN CAPS   Inhalation   Place 18 mcg into inhaler and inhale daily.         . FENTANYL 50 MCG/HR TD PT72   Transdermal   Place 1 patch (50 mcg total) onto the skin every 3 (three) days.   10 patch   0     BP 152/84  Pulse 68  Temp 98.4 F (36.9 C) (Oral)  Resp 20  Wt 500 lb (226.799 kg)  SpO2 93%  Vital signs normal    Physical Exam  Nursing note and vitals reviewed. Constitutional: She is oriented to person, place, and time. She appears well-developed and well-nourished.  Non-toxic appearance. She does not appear ill. No distress.       Morbidly obese  HENT:  Head: Normocephalic and atraumatic.  Right Ear: External ear normal.  Left Ear: External ear normal.  Nose: Nose normal. No mucosal edema or rhinorrhea.  Mouth/Throat: Oropharynx is clear and moist and mucous membranes are normal. No dental abscesses or uvula swelling.  Eyes: Conjunctivae normal and EOM are normal. Pupils are equal, round, and reactive to light.  Neck: Normal range of motion and full passive range of motion without pain. Neck supple.  Cardiovascular: Normal rate, regular rhythm and normal heart sounds.  Exam reveals no gallop  and no friction rub.   No murmur heard. Pulmonary/Chest: Effort normal and breath sounds normal. No respiratory distress. She has no wheezes. She has no rhonchi. She has no rales. She exhibits no tenderness and no crepitus.  Abdominal: Soft. Normal appearance  and bowel sounds are normal. She exhibits no distension. There is no tenderness. There is no rebound and no guarding.  Musculoskeletal: Normal range of motion. She exhibits no edema and no tenderness.       Patient has no obvious deformity to her left lower extremity. She has mild diffuse tenderness in her left foot. There is no increased swelling, redness. She has a small area around her left great toenail consistent with a healing ingrown toenail.   Neurological: She is alert and oriented to person, place, and time. She has normal strength. No cranial nerve deficit.  Skin: Skin is warm, dry and intact. No rash noted. No erythema. No pallor.  Psychiatric: She has a normal mood and affect. Her speech is normal and behavior is normal. Her mood appears not anxious.    ED Course  Procedures (including critical care time) Patient very angry in general with  medical care. She states she was dropped when she was here before. She is angry at her PCP.  Discussed patient's normal results. Patient has been bedridden. She probably needs physical therapy for her deconditioning from being in bed for 3 months.  1640 p.m. Dr. Mayer Camel would admit, he is going to see in the ED  11/28/2011 CT OF THE LEFT FOOT WITHOUT CONTRAST  Technique: Multidetector CT imaging was performed according to the  standard protocol. Multiplanar CT image reconstructions were also  generated.  Comparison: 11/28/2011.  IMPRESSION:  1. Small avulsion fracture off the dorsal aspect of the navicular  bone.  2. Inferior subluxation of the cuboid relative to the calcaneus  suggesting soft tissue/ligamentous/capsular injury. Tiny  nondisplaced fracture of the anterior process of the calcaneus.  3. Nonspecific dorsal forefoot soft tissue swelling, likely edema.  Original Report Authenticated By: Andreas Newport, M.D.    Results for orders placed during the hospital encounter of 02/19/12  CBC WITH DIFFERENTIAL      Component Value  Range   WBC 10.4  4.0 - 10.5 K/uL   RBC 5.78 (*) 3.87 - 5.11 MIL/uL   Hemoglobin 13.7  12.0 - 15.0 g/dL   HCT 16.1  09.6 - 04.5 %   MCV 76.1 (*) 78.0 - 100.0 fL   MCH 23.7 (*) 26.0 - 34.0 pg   MCHC 31.1  30.0 - 36.0 g/dL   RDW 40.9 (*) 81.1 - 91.4 %   Platelets 338  150 - 400 K/uL   Neutrophils Relative 66  43 - 77 %   Neutro Abs 6.9  1.7 - 7.7 K/uL   Lymphocytes Relative 22  12 - 46 %   Lymphs Abs 2.3  0.7 - 4.0 K/uL   Monocytes Relative 9  3 - 12 %   Monocytes Absolute 0.9  0.1 - 1.0 K/uL   Eosinophils Relative 4  0 - 5 %   Eosinophils Absolute 0.4  0.0 - 0.7 K/uL   Basophils Relative 0  0 - 1 %   Basophils Absolute 0.0  0.0 - 0.1 K/uL  COMPREHENSIVE METABOLIC PANEL      Component Value Range   Sodium 135  135 - 145 mEq/L   Potassium 3.5  3.5 - 5.1 mEq/L   Chloride 96  96 - 112 mEq/L   CO2  30  19 - 32 mEq/L   Glucose, Bld 107 (*) 70 - 99 mg/dL   BUN 12  6 - 23 mg/dL   Creatinine, Ser 1.61  0.50 - 1.10 mg/dL   Calcium 9.0  8.4 - 09.6 mg/dL   Total Protein 7.5  6.0 - 8.3 g/dL   Albumin 3.0 (*) 3.5 - 5.2 g/dL   AST 14  0 - 37 U/L   ALT 13  0 - 35 U/L   Alkaline Phosphatase 81  39 - 117 U/L   Total Bilirubin 0.3  0.3 - 1.2 mg/dL   GFR calc non Af Amer >90  >90 mL/min   GFR calc Af Amer >90  >90 mL/min   Laboratory interpretation all normal l    Dg Foot Complete Left  02/19/2012  *RADIOLOGY REPORT*  Clinical Data: Pain secondary to a fall today.  LEFT FOOT - COMPLETE 3+ VIEW  Comparison: Radiographs and a CT scan dated 11/28/2011  Findings: There is no acute fracture or dislocation.  There are degenerative spurs of the ankle joint and there is dorsal spurring of the talus and navicular.  There is patchy diffuse osteopenia which is increased since the prior exam.There is soft tissue swelling dorsal to the distal talus.  IMPRESSION: Soft tissue swelling dorsal to the distal talus.  No acute osseous abnormality.  Patchy osteopenia.   Original Report Authenticated By: Francene Boyers, M.D.      Date: 02/19/2012  Rate: 74  Rhythm: normal sinus rhythm  QRS Axis: normal  Intervals: normal  ST/T Wave abnormalities: normal  Conduction Disutrbances:none  Narrative Interpretation:   Old EKG Reviewed: unchanged from 12/31/2001      1. Syncope   2. Physical deconditioning     Plan admission  Devoria Albe, MD, FACEP   MDM           Ward Givens, MD 02/19/12 [phone]

## 2012-02-19 NOTE — ED Notes (Signed)
Pt to xray

## 2012-02-19 NOTE — ED Notes (Addendum)
Per ems pt reports she fractured her left ankle 10/21 and was treated in [facility] ED, sent home with foot boot. Since then pt has not gotten out of bed until today. Pt stood up and passed out due to pain/ burning sensation in foot. Fell back onto bed. Denies hitting head. Pain 8/10. Pt morbidly obese.

## 2012-02-19 NOTE — ED Notes (Signed)
RN to obtain labs with start of IV 

## 2012-02-19 NOTE — H&P (Signed)
[facility] History and Physical  [name] ZOX:[id number] DOB: 1955-03-12 DOA: 02/19/2012   PCP: Josue Hector, MD   Chief Complaint: Syncope  HPI:  57 year old female with a history of hypertension, asthma, depression, osteoarthritis, and stroke with residual lower extremity right hemiparesis presents after syncopal episode today. The patient's history dates back to 11/28/2011 1 she caught a sleeve over close in her motorized wheelchair after which the wheelchair ran into a door causing some trauma to her left foot. The patient came to the emergency department at which time she was diagnosed with a left foot fracture of the navicular bone with subluxation of her cuboid. The patient was sent home with a cast and told to follow up with orthopedics. Since that period of time, the patient has not been able to transfer out of bed, and she has been bedbound essentially for the past 2 and half months. The patient has some private help that comes to clear her up from her urine and feces twice a week. The rest of the time, the patient has help from her daughter as well as grandchildren who watch over her in the afternoon and evenings. The patient feels very frustrated because she has not have the finances to go to the physician.  Unfortunately, the patient is morbidly obese and has required ambulance transfer to her appointments which she cannot afford. She was told by her orthopedist that her foot would eventually heal after a period of 6-8 weeks of immobilization. She decided today was the day where she would try to stand up. The patient sat up in her bed and felt some dizziness. With the assistance of her grandsons she decided to stand up. She felt dizzy and lightheaded after which she had a syncopal episode which she describes as "blacking out". This episode was witnessed and lasted less than 1 minute. There was no bowel or bladder incontinence. No tongue biting. When the patient woke up,  the patient was lucid. For this past week, the patient has complained of some chest discomfort, but she denies any shortness of breath, vomiting, diarrhea, abdominal pain, dysuria. Of note, the patient has had problems with her finances and has not taken her prescription medicines as directed including her antihypertensives and asthma medications. Patient denies fevers, chills, rigors, new rashes. Patient denies abusing any of her opioid medications.  In the emergency department, the patient was noted to have WBC 10.4, sodium 135, a blood pressure of 152/84. EKG was sinus rhythm. BUN 12 creatinine 0.67. X-ray of the left foot reveals no acute fractures, soft tissue edema. Assessment/Plan: Syncope -appears to be an orthostatic type phenomenon -Check orthostatics -echo -telemetry -I will start the patient on fluids as she appears to be mildly hemoconcentrated above her baseline hemoglobin. -Check UA/urine culture -Check urine drug screen -TSH  -CT of brain if possible given the patient's body habitus Chest discomfort -Cycle troponins -Repeat EKG in the morning -Chest x-ray Abdominal pain  -likely due to constipation -Last bowel movement 2 weeks ago -Obtain abdominal x-ray  -Start additional stool softener, cathartics Hypertension -Continue losartan Deconditioning  -Consult PT/OT  -Patient will need skilled nursing facility which she is amenable   opioid dependence -Patient follows with Dr. Hermelinda Medicus- -continue fentanyl patch and oxycodone when necessary  -Urine drug screen   asthma -Suspect patient has obesity hypoventilation syndrome -Continue Advair  -When necessary albuterol nebulizer   anxiety/depression -Continue Celexa -Continue when necessary alprazolam Leg pain and edema -venous duplex rule out DVT due to  immobility   Active Problems:  * No active hospital problems. *        Past Medical History  Diagnosis Date  . Hypertension   . Thoracic or lumbosacral  neuritis or radiculitis, unspecified   . Lumbago   . Obesity   . Primary localized osteoarthrosis, lower leg   . Facet syndrome, lumbar   . Neuromuscular disorder   . GERD (gastroesophageal reflux disease)   . Depression   . Anxiety    Past Surgical History  Procedure Date  . Abdominal hysterectomy   . Cesarean section     x2  . Cholecystectomy    Social History:  reports that she has never smoked. She has never used smokeless tobacco. Her alcohol and drug histories not on file.   Family History  Problem Relation Age of Onset  . Asthma Mother   . COPD Mother   . Heart disease Mother   . Heart disease Father      Allergies  Allergen Reactions  . Sulfa Antibiotics Itching and Swelling      Prior to Admission medications   Medication Sig Start Date End Date Taking? Authorizing Provider  acetaminophen (TYLENOL) 500 MG tablet Take 1,000 mg by mouth every 6 (six) hours as needed. For pain.   Yes Historical Provider, MD  ALPRAZolam Prudy Feeler) 1 MG tablet Take 1 mg by mouth 4 (four) times daily as needed. For anxiety.   Yes Historical Provider, MD  citalopram (CELEXA) 40 MG tablet Take 40 mg by mouth daily.   Yes Historical Provider, MD  cyclobenzaprine (FLEXERIL) 10 MG tablet Take 1 tablet (10 mg total) by mouth 2 (two) times daily as needed for muscle spasms. 10/21/11  Yes Clydie Braun Prueter, PA-C  diclofenac sodium (VOLTAREN) 1 % GEL Apply 1 application topically 4 (four) times daily as needed. For joint pain.   Yes Historical Provider, MD  estrogens, conjugated, (PREMARIN) 0.625 MG tablet Take 0.625 mg by mouth daily. Take daily for 21 days then do not take for 7 days.   Yes Historical Provider, MD  Fluticasone-Salmeterol (ADVAIR) 500-50 MCG/DOSE AEPB Inhale 1 puff into the lungs every 12 (twelve) hours.   Yes Historical Provider, MD  gabapentin (NEURONTIN) 300 MG capsule Take 1 capsule (300 mg total) by mouth 3 (three) times daily. 06/21/11  Yes Ranelle Oyster, MD  losartan  (COZAAR) 50 MG tablet Take 50 mg by mouth daily.   Yes Historical Provider, MD  montelukast (SINGULAIR) 10 MG tablet Take 10 mg by mouth daily.   Yes Historical Provider, MD  omeprazole (PRILOSEC) 20 MG capsule Take 20 mg by mouth 2 (two) times daily.   Yes Historical Provider, MD  Oxycodone HCl 10 MG TABS Take 1 tablet (10 mg total) by mouth 4 (four) times daily as needed. 02/09/12  Yes Ranelle Oyster, MD  tiotropium (SPIRIVA) 18 MCG inhalation capsule Place 18 mcg into inhaler and inhale daily.   Yes Historical Provider, MD  fentaNYL (DURAGESIC) 50 MCG/HR Place 1 patch (50 mcg total) onto the skin every 3 (three) days. 02/09/12 03/10/12  Ranelle Oyster, MD    Review of Systems:  Constitutional:  No weight loss, night sweats, Fevers, chills, fatigue.  Head&Eyes: No headache.  No vision loss.  No eye pain or scotoma ENT:  No Difficulty swallowing,Tooth/dental problems,Sore throat,  No ear ache, post nasal drip,  Cardio-vascular:  NoOrthopnea, PND, swelling in lower extremities, palpitations  GI:  No  abdominal pain, nausea, vomiting, diarrhea, loss of appetite,  hematochezia, melena, heartburn, indigestion, Resp:  No shortness of breath with exertion or at rest. No cough. No coughing up of blood  Skin:  no rash or lesions.  GU:  no dysuria, change in color of urine, no urgency or frequency. No flank pain.  Musculoskeletal:  No joint pain or swelling. No decreased range of motion. Complains of bilateral leg pain, left foot pain. Psych:  No change in mood or affect. . Neurologic: No headache, no dysesthesia, no focal weakness, no vision loss.   Physical Exam: Filed Vitals:   02/19/12 1305 02/19/12 1314 02/19/12 1546  BP:  152/84 108/64  Pulse:  68 84  Temp:  98.4 F (36.9 C)   TempSrc:  Oral   Resp:  20 17  Weight: 226.799 kg (500 lb)    SpO2: 96% 93% 95%   General:  A&O x 3, NAD, nontoxic, pleasant/cooperative Head/Eye: No conjunctival hemorrhage, no icterus, [place]/AT, No  nystagmus ENT:  No icterus,  No thrush, poor dentition, no pharyngeal exudate Neck:  No masses, no lymphadenpathy, no bruits CV:  RRR, no rub, no gallop, no S3 Lung:  CTAB, good air movement, no wheeze, no rhonchi Abdomen: soft, lower quadrant tender to palpation without any guarding or rebound tenderness  +BS, nondistended, no peritoneal signs Ext: No cyanosis, No rashes, No petechiae, No lymphangitis, No edema Neuro: CNII-XII intact, strength 4/5 in bilateral upper  Extremities, and 3/5 left lower extremity, 3-/5 right  lower extremities, no dysmetria  Labs on Admission:  Basic Metabolic Panel:  Lab 02/19/12 1191  NA 135  K 3.5  CL 96  CO2 30  GLUCOSE 107*  BUN 12  CREATININE 0.67  CALCIUM 9.0  MG --  PHOS --   Liver Function Tests:  Lab 02/19/12 1430  AST 14  ALT 13  ALKPHOS 81  BILITOT 0.3  PROT 7.5  ALBUMIN 3.0*   No results found for this basename: LIPASE:5,AMYLASE:5 in the last 168 hours No results found for this basename: AMMONIA:5 in the last 168 hours CBC:  Lab 02/19/12 1430  WBC 10.4  NEUTROABS 6.9  HGB 13.7  HCT 44.0  MCV 76.1*  PLT 338   Cardiac Enzymes: No results found for this basename: CKTOTAL:5,CKMB:5,CKMBINDEX:5,TROPONINI:5 in the last 168 hours BNP: No components found with this basename: POCBNP:5 CBG: No results found for this basename: GLUCAP:5 in the last 168 hours  Radiological Exams on Admission: Dg Foot Complete Left  02/19/2012  *RADIOLOGY REPORT*  Clinical Data: Pain secondary to a fall today.  LEFT FOOT - COMPLETE 3+ VIEW  Comparison: Radiographs and a CT scan dated 11/28/2011  Findings: There is no acute fracture or dislocation.  There are degenerative spurs of the ankle joint and there is dorsal spurring of the talus and navicular.  There is patchy diffuse osteopenia which is increased since the prior exam.There is soft tissue swelling dorsal to the distal talus.  IMPRESSION: Soft tissue swelling dorsal to the distal talus.  No  acute osseous abnormality.  Patchy osteopenia.   Original Report Authenticated By: Francene Boyers, M.D.     EKG: Independently reviewed. Sinus rhythm, normal axis, nonspecific T-wave changes    Time spent:70 minutes Code Status:   full Family Communication:   Daughter updated  at bedside   Donna Hemminger, DO  [facility] Pager [phone]  If 7PM-7AM, please contact night-coverage [email or web address] Password [facility] 02/19/2012, 5:30 PM

## 2012-02-20 ENCOUNTER — Encounter (HOSPITAL_COMMUNITY): Payer: Self-pay | Admitting: *Deleted

## 2012-02-20 ENCOUNTER — Inpatient Hospital Stay (HOSPITAL_COMMUNITY): Payer: Medicare Other

## 2012-02-20 DIAGNOSIS — M79609 Pain in unspecified limb: Secondary | ICD-10-CM

## 2012-02-20 DIAGNOSIS — R609 Edema, unspecified: Secondary | ICD-10-CM

## 2012-02-20 LAB — COMPREHENSIVE METABOLIC PANEL
ALT: 11 U/L (ref 0–35)
AST: 10 U/L (ref 0–37)
Alkaline Phosphatase: 69 U/L (ref 39–117)
CO2: 30 mEq/L (ref 19–32)
Chloride: 97 mEq/L (ref 96–112)
GFR calc Af Amer: >90 mL/min (ref >90)
GFR calc non Af Amer: >90 mL/min (ref >90)
Glucose, Bld: 112 mg/dL — ABNORMAL HIGH (ref 70–99)
Sodium: 136 mEq/L (ref 135–145)
Total Bilirubin: 0.4 mg/dL (ref 0.3–1.2)

## 2012-02-20 LAB — URINE CULTURE

## 2012-02-20 MED ORDER — ENOXAPARIN SODIUM 80 MG/0.8ML ~~LOC~~ SOLN
80.0000 mg | SUBCUTANEOUS | Status: DC
  Administered 2012-02-20 – 2012-02-21 (×2): 80 mg via SUBCUTANEOUS
  Filled 2012-02-20 (×4): qty 0.8

## 2012-02-20 NOTE — Progress Notes (Signed)
[facility] PROGRESS NOTE  [name] NWG:[id number] DOB: 1955/11/04 DOA: 02/19/2012 PCP: Josue Hector, MD  Assessment/Plan: Syncope  -appears to be an orthostatic type phenomenon  -Check orthostatics, orders placed -echo results pending -telemetry: no red flags reported. -Check UA: WNL's  -Check urine drug screen: Results back and negative -TSH: WNL's -CT of brain ordered and pending.  Chest discomfort  -Troponins negative no chest pain -EKG shows Sinus tachycardia with no St elevation or depressions with non specific st wave changes.  -Chest x-ray shows no acute abnormalities.  Abdominal pain  - abd x ray shows benign appearing abdomen - resolved with bowel movements  Hypertension  -Continue losartan and currently well controlled.  Deconditioning  -Consult PT/OT  -Patient will need skilled nursing facility which she is amenable  opioid dependence  -Patient follows with Dr. Hermelinda Medicus-  -continue fentanyl patch and oxycodone when necessary  -Urine drug screen negative  asthma  -Suspect patient has obesity hypoventilation syndrome  -Continue Advair  -When necessary albuterol nebulizer   anxiety/depression  -Continue Celexa  -Continue when necessary alprazolam   Leg pain and edema  -venous duplex negative for DVT   Code Status: full Family Communication: no family at bedside (indicate person spoken with, relationship, and if by phone, the number) Disposition Plan: SNF and will require 3 days as inpatient.  Likely ready for d/c wednesday   Consultants:  none  Procedures:  CT of head w/o contrast  Antibiotics:  None  HPI/Subjective: Patient states that she feels better.  Still some dizziness with standing.  No other complaints.  Objective: Filed Vitals:   02/19/12 2135 02/20/12 0635 02/20/12 0755 02/20/12 1426  BP:  114/60  112/83  Pulse:  83  96  Temp:  97.7 F (36.5 C)  98 F (36.7 C)  TempSrc:  Oral  Oral  Resp:  20  20    Height:      Weight:      SpO2: 94% 95% 96% 95%    Intake/Output Summary (Last 24 hours) at 02/20/12 1852 Last data filed at 02/20/12 1700  Gross per 24 hour  Intake 1897.5 ml  Output      0 ml  Net 1897.5 ml   Filed Weights   02/19/12 1305 02/19/12 2100  Weight: 226.799 kg (500 lb) 172.14 kg (379 lb 8 oz)    Exam:   General:  Pt in NAD, Alert and Awake  Cardiovascular: RRR, No mrg  Respiratory: CTA BL, no wheezes  Abdomen: obese, soft, nt  Data Reviewed: Basic Metabolic Panel:  Lab 02/20/12 5784 02/19/12 1430  NA 136 135  K 4.0 3.5  CL 97 96  CO2 30 30  GLUCOSE 112* 107*  BUN 11 12  CREATININE 0.73 0.67  CALCIUM 8.7 9.0  MG -- --  PHOS -- --   Liver Function Tests:  Lab 02/20/12 0625 02/19/12 1430  AST 10 14  ALT 11 13  ALKPHOS 69 81  BILITOT 0.4 0.3  PROT 6.6 7.5  ALBUMIN 2.7* 3.0*   No results found for this basename: LIPASE:5,AMYLASE:5 in the last 168 hours No results found for this basename: AMMONIA:5 in the last 168 hours CBC:  Lab 02/19/12 1430  WBC 10.4  NEUTROABS 6.9  HGB 13.7  HCT 44.0  MCV 76.1*  PLT 338   Cardiac Enzymes:  Lab 02/20/12 0105 02/19/12 1820  CKTOTAL -- --  CKMB -- --  CKMBINDEX -- --  TROPONINI <0.30 <0.30   BNP (last 3 results) No  results found for this basename: PROBNP:3 in the last 8760 hours CBG: No results found for this basename: GLUCAP:5 in the last 168 hours  Recent Results (from the past 240 hour(s))  MRSA PCR SCREENING     Status: Normal   Collection Time   02/19/12 10:39 PM      Component Value Range Status Comment   MRSA by PCR NEGATIVE  NEGATIVE Final      Studies: Dg Chest 1 View  02/19/2012  *RADIOLOGY REPORT*  Clinical Data: Chest pain.  CHEST - 1 VIEW  Comparison: 03/22/2010 and 09/25/2005  Findings: Heart size and pulmonary vascularity are normal and the lungs are clear.  Slight thoracic scoliosis, unchanged.  IMPRESSION: No acute abnormality.   Original Report Authenticated By: Francene Boyers, M.D.    Ct Head Wo Contrast  02/20/2012  *RADIOLOGY REPORT*  Clinical Data: Syncope , weakness.  CT HEAD WITHOUT CONTRAST  Technique:  Contiguous axial images were obtained from the base of the skull through the vertex without contrast.  Comparison: 03/22/2010  Findings: There is no evidence of acute intracranial hemorrhage, brain edema, mass lesion, acute infarction,   mass effect, or midline shift. Acute infarct may be inapparent on noncontrast CT.  No other intra- axial abnormalities are seen, and the ventricles and sulci are within normal limits in size and symmetry.   No abnormal extra- axial fluid collections or masses are identified.  No significant calvarial abnormality.  IMPRESSION: 1. Negative for bleed or other acute intracranial process.   Original Report Authenticated By: D. Andria Rhein, MD    Dg Abd 2 Views  02/19/2012  *RADIOLOGY REPORT*  Clinical Data: Abdominal pain.  ABDOMEN - 2 VIEW  Comparison: Scout image for abdominal CT scan dated 09/09/2005  Findings: There is no free air.  There is air throughout multiple nondistended loops of large and small bowel.  Gallbladder has been removed.  No acute osseous abnormality.  IMPRESSION: Benign-appearing abdomen.   Original Report Authenticated By: Francene Boyers, M.D.    Dg Foot Complete Left  02/19/2012  *RADIOLOGY REPORT*  Clinical Data: Pain secondary to a fall today.  LEFT FOOT - COMPLETE 3+ VIEW  Comparison: Radiographs and a CT scan dated 11/28/2011  Findings: There is no acute fracture or dislocation.  There are degenerative spurs of the ankle joint and there is dorsal spurring of the talus and navicular.  There is patchy diffuse osteopenia which is increased since the prior exam.There is soft tissue swelling dorsal to the distal talus.  IMPRESSION: Soft tissue swelling dorsal to the distal talus.  No acute osseous abnormality.  Patchy osteopenia.   Original Report Authenticated By: Francene Boyers, M.D.     Scheduled Meds:   .  aspirin EC  81 mg Oral Daily  . citalopram  40 mg Oral Daily  . docusate sodium  100 mg Oral BID  . enoxaparin (LOVENOX) injection  80 mg Subcutaneous Q24H  . estrogens (conjugated)  0.625 mg Oral Daily  . fentaNYL  50 mcg Transdermal Q72H  . gabapentin  300 mg Oral TID  . losartan  50 mg Oral Daily  . mometasone-formoterol  2 puff Inhalation BID  . montelukast  10 mg Oral Daily  . pantoprazole  40 mg Oral Daily  . senna  2 tablet Oral QHS  . sodium chloride  3 mL Intravenous Q12H  . tiotropium  18 mcg Inhalation Daily   Continuous Infusions:   . sodium chloride 75 mL/hr at 02/20/12 1230  Principal Problem:  *Syncope and collapse Active Problems:  Morbid obesity  Constipation  Chest pain    Time spent: > 30 minutes    Penny Pia  [facility] Pager [phone]. If 8PM-8AM, please contact night-coverage at [email or web address], password [facility] 02/20/2012, 6:52 PM  LOS: 1 day

## 2012-02-20 NOTE — Progress Notes (Signed)
Nutrition Brief Note  Patient identified on the Malnutrition Screening Tool (MST) Report  Body mass index is 57.70 kg/(m^2). Patient meets criteria for morbid obesity based on current BMI.   Current diet order is Heart Healthy, patient is consuming approximately 100% of meals at this time. Labs and medications reviewed.   Pt incorrectly screened positive for possible malnutrition despite denial of poor appetite or recent changes in wt.  Pt denies questions or education needs.  Discussed Heart Healthy options on menu.  No nutrition interventions warranted at this time. If nutrition issues arise, please consult RD.   Loyce Dys, MS RD LDN Clinical Inpatient Dietitian Pager: [phone] Weekend/After hours pager: [phone]

## 2012-02-20 NOTE — Progress Notes (Signed)
   CARE MANAGEMENT NOTE 02/20/2012  Patient:  [name], [name]   Account Number:  [id number]  Date Initiated:  02/20/2012  Documentation initiated by:  Jiles Crocker  Subjective/Objective Assessment:   ADMITTED WITH SYNCOPY     Action/Plan:   PCP: Josue Hector, MD  LIVES WITH FAMILY MEMBERS; AWAITING FOR PT/OT EVALS FOR DISPOSITION   Anticipated DC Date:  02/25/2012   Anticipated DC Plan:  HOME W HOME HEALTH SERVICES      DC Planning Services  CM consult          Status of service:  In process, will continue to follow Medicare Important Message given?  NA - LOS <3 / Initial given by admissions (If response is "NO", the following Medicare IM given date fields will be blank)  Per UR Regulation:  Reviewed for med. necessity/level of care/duration of stay Comments:  02/20/2012- B Jaqueline Uber RN,BSN,MHA

## 2012-02-20 NOTE — Progress Notes (Signed)
Echocardiogram 2D Echocardiogram has been performed.  [name] 02/20/2012, 2:57 PM

## 2012-02-20 NOTE — Evaluation (Signed)
Physical Therapy Evaluation Patient Details Name: [name] MRN: [id number] DOB: 26-Sep-1955 Today's Date: 02/20/2012 Time: 1432-1500 PT Time Calculation (min): 28 min  PT Assessment / Plan / Recommendation Clinical Impression  Pt admitted for syncope episode after trying to stand at home after being in bed since October.  Pt presents very weak and deconditioned.  Pt would benefit from acute PT services in order to improve independence with transfers and increase strength and activity tolerance to prepare for d/c to next venue.    PT Assessment  Patient needs continued PT services    Follow Up Recommendations  SNF;Supervision/Assistance - 24 hour    Does the patient have the potential to tolerate intense rehabilitation      Barriers to Discharge        Equipment Recommendations  None recommended by PT    Recommendations for Other Services     Frequency Min 3X/week    Precautions / Restrictions Precautions Precautions: Fall Precaution Comments: pt reports foot/ankle fx in October when she was sent home with boot, has not had follow up visit, WB status unknown however xray results report "soft tissue swelling dorsal to the distal talus.  No acute osseous abnormality."    Pertinent Vitals/Pain 9/10 back pain, premedicated, repositioned in bed, pt reports pain with ease with rest      Mobility  Bed Mobility Bed Mobility: Supine to Sit;Sit to Supine;Scooting to HOB Supine to Sit: 1: +2 Total assist;HOB elevated Supine to Sit: Patient Percentage: 50% Sit to Supine: 1: +2 Total assist;HOB elevated Sit to Supine: Patient Percentage: 50% Scooting to HOB: 1: +2 Total assist;With rail (trendelenberg) Scooting to [facility]: Patient Percentage: 70% Details for Bed Mobility Assistance: verbal cues for technique, increased time and effort, pt relies heavily on upper body to pull/push Transfers Transfers: Sit to Stand Details for Transfer Assistance: attempted however pt unable to  stand due to L foot pain    Shoulder Instructions     Exercises     PT Diagnosis: Generalized weakness  PT Problem List: Decreased strength;Decreased activity tolerance;Decreased mobility;Obesity PT Treatment Interventions: DME instruction;Functional mobility training;Therapeutic activities;Therapeutic exercise;Patient/family education;Wheelchair mobility training   PT Goals Acute Rehab PT Goals PT Goal Formulation: With patient Time For Goal Achievement: 03/05/12 Potential to Achieve Goals: Fair Pt will go Supine/Side to Sit: with min assist PT Goal: Supine/Side to Sit - Progress: Goal set today Pt will go Sit to Supine/Side: with min assist PT Goal: Sit to Supine/Side - Progress: Goal set today Pt will go Sit to Stand: with min assist PT Goal: Sit to Stand - Progress: Goal set today Pt will go Stand to Sit: with min assist PT Goal: Stand to Sit - Progress: Goal set today Pt will Transfer Bed to Chair/Chair to Bed: with mod assist PT Transfer Goal: Bed to Chair/Chair to Bed - Progress: Goal set today Pt will Perform Home Exercise Program: with supervision, verbal cues required/provided PT Goal: Perform Home Exercise Program - Progress: Goal set today  Visit Information  Last PT Received On: 02/20/12 Assistance Needed: +2    Subjective Data  Subjective: I've been in the bed since October.   Prior Functioning  Home Living Lives With: Family Type of Home: House Home Layout: Able to live on main level with bedroom/bathroom Home Adaptive Equipment: Hospital bed;Walker - rolling;Wheelchair - powered Prior Function Level of Independence: Needs assistance Comments: Pt reports she has not been out of bed since sustaining L ankle fx in Oct.  Reports family has  been assisting with hygiene. However previous to October she states she was modified independent with stand pivot with RW to power w/c. Communication Communication: No difficulties    Cognition  Overall Cognitive Status:  Appears within functional limits for tasks assessed/performed Arousal/Alertness: Awake/alert Orientation Level: Appears intact for tasks assessed Behavior During Session: [facility] for tasks performed    Extremity/Trunk Assessment Right Upper Extremity Assessment RUE ROM/Strength/Tone: [facility] for tasks assessed Left Upper Extremity Assessment LUE ROM/Strength/Tone: WFL for tasks assessed Right Lower Extremity Assessment RLE ROM/Strength/Tone: Deficits RLE ROM/Strength/Tone Deficits: grossly at least 2+/5 throughout per functional observation, body habitus limiting mobility, pt unable to stand  Left Lower Extremity Assessment LLE ROM/Strength/Tone: Deficits LLE ROM/Strength/Tone Deficits: grossly at least 2+/5 throughout per functional observation, body habitus limiting mobility, pt unable to stand    Balance Balance Balance Assessed: Yes Static Sitting Balance Static Sitting - Balance Support: Feet supported;Right upper extremity supported Static Sitting - Level of Assistance: 4: Min assist Static Sitting - Comment/# of Minutes: min/guard sitting EOB for 8 minutes due to pt dizziness upon sitting with occasional posterior lean but able to correct with verbal cue  End of Session PT - End of Session Activity Tolerance: Patient limited by fatigue;Patient limited by pain Patient left: in bed;with call bell/phone within reach  GP     [facility] E 02/20/2012, 4:36 PM Pager: [phone]

## 2012-02-20 NOTE — Progress Notes (Signed)
Unable to get an orthostatic Blood pressure on admission due to patient unable to stand. Roosvelt Maser, RN

## 2012-02-20 NOTE — Progress Notes (Signed)
*  PRELIMINARY RESULTS* Vascular Ultrasound Lower extremity venous duplex has been completed.  Preliminary findings: Technically difficult study. No obvious evidence of DVT noted in visualized veins.  Farrel Demark, RDMS, RVT 02/20/2012, 8:35 AM

## 2012-02-21 NOTE — Progress Notes (Signed)
Orthostatic vitals lying : bp 125/52,hr 80,sat 96                           SittinG: BP 140/81,HR 86,SAT 97                    SITTING ON SIDE OF BED: BP 131/71,HR 63, SAT93

## 2012-02-21 NOTE — Progress Notes (Signed)
[facility] PROGRESS NOTE  [name] EAV:[id number] DOB: 1955-04-21 DOA: 02/19/2012 PCP: Josue Hector, MD  Assessment/Plan: Syncope  -appears to be an orthostatic type phenomenon.  Recent orthostatic vitals negative.  Will recheck next am. -Check orthostatics orders placed -echo results pending -telemetry: no red flags reported. -Check UA: WNL's  -Check urine drug screen: Results back and negative -TSH: WNL's -CT of brain ordered and pending.  Chest discomfort  -Troponins negative no chest pain -EKG shows Sinus tachycardia with no St elevation or depressions with non specific st wave changes.  -Chest x-ray shows no acute abnormalities.  Abdominal pain  - abd x ray shows benign appearing abdomen - resolved with bowel movements  Hypertension  -Continue losartan and currently well controlled.  Deconditioning  -Consult PT/OT  -Patient will need skilled nursing facility which she is amenable  opioid dependence  -Patient follows with Dr. Hermelinda Medicus-  -continue fentanyl patch and oxycodone when necessary  -Urine drug screen negative  asthma  -Suspect patient has obesity hypoventilation syndrome  -Continue Advair  -When necessary albuterol nebulizer   anxiety/depression  -Continue Celexa  -Continue when necessary alprazolam   Leg pain and edema  -venous duplex negative for DVT   Code Status: full Family Communication: no family at bedside (indicate person spoken with, relationship, and if by phone, the number) Disposition Plan: SNF and will require 3 days as inpatient.  Likely ready for d/c Wednesday or Thursday. Echo results pending   Consultants:  none  Procedures:  CT of head w/o contrast  Antibiotics:  None  HPI/Subjective: Patient states that she feels better.  Still some dizziness with standing.  No other complaints.  No acute issues overnight.  Objective: Filed Vitals:   02/20/12 2215 02/21/12 0514 02/21/12 0925 02/21/12 1356    BP: 121/65 105/66  133/85  Pulse: 83 85  87  Temp: 98.9 F (37.2 C) 97.3 F (36.3 C)  97.6 F (36.4 C)  TempSrc: Oral Oral  Oral  Resp: 18 20  20   Height:      Weight:      SpO2: 94% 96% 95% 96%    Intake/Output Summary (Last 24 hours) at 02/21/12 1814 Last data filed at 02/21/12 1356  Gross per 24 hour  Intake    603 ml  Output      0 ml  Net    603 ml   Filed Weights   02/19/12 1305 02/19/12 2100  Weight: 226.799 kg (500 lb) 172.14 kg (379 lb 8 oz)    Exam:   General:  Pt in NAD, Alert and Awake  Cardiovascular: RRR, No mrg  Respiratory: CTA BL, no wheezes  Abdomen: obese, soft, nt  Data Reviewed: Basic Metabolic Panel:  Lab 02/20/12 7829 02/19/12 1430  NA 136 135  K 4.0 3.5  CL 97 96  CO2 30 30  GLUCOSE 112* 107*  BUN 11 12  CREATININE 0.73 0.67  CALCIUM 8.7 9.0  MG -- --  PHOS -- --   Liver Function Tests:  Lab 02/20/12 0625 02/19/12 1430  AST 10 14  ALT 11 13  ALKPHOS 69 81  BILITOT 0.4 0.3  PROT 6.6 7.5  ALBUMIN 2.7* 3.0*   No results found for this basename: LIPASE:5,AMYLASE:5 in the last 168 hours No results found for this basename: AMMONIA:5 in the last 168 hours CBC:  Lab 02/19/12 1430  WBC 10.4  NEUTROABS 6.9  HGB 13.7  HCT 44.0  MCV 76.1*  PLT 338   Cardiac Enzymes:  Lab 02/20/12 0105 02/19/12 1820  CKTOTAL -- --  CKMB -- --  CKMBINDEX -- --  TROPONINI <0.30 <0.30   BNP (last 3 results) No results found for this basename: PROBNP:3 in the last 8760 hours CBG: No results found for this basename: GLUCAP:5 in the last 168 hours  Recent Results (from the past 240 hour(s))  URINE CULTURE     Status: Normal   Collection Time   02/19/12  6:38 PM      Component Value Range Status Comment   Specimen Description URINE, CATHETERIZED   Final    Special Requests NONE   Final    Culture  Setup Time 02/20/2012 03:09   Final    Colony Count NO GROWTH   Final    Culture NO GROWTH   Final    Report Status 02/20/2012 FINAL    Final   MRSA PCR SCREENING     Status: Normal   Collection Time   02/19/12 10:39 PM      Component Value Range Status Comment   MRSA by PCR NEGATIVE  NEGATIVE Final      Studies: Dg Chest 1 View  02/19/2012  *RADIOLOGY REPORT*  Clinical Data: Chest pain.  CHEST - 1 VIEW  Comparison: 03/22/2010 and 09/25/2005  Findings: Heart size and pulmonary vascularity are normal and the lungs are clear.  Slight thoracic scoliosis, unchanged.  IMPRESSION: No acute abnormality.   Original Report Authenticated By: Francene Boyers, M.D.    Ct Head Wo Contrast  02/20/2012  *RADIOLOGY REPORT*  Clinical Data: Syncope , weakness.  CT HEAD WITHOUT CONTRAST  Technique:  Contiguous axial images were obtained from the base of the skull through the vertex without contrast.  Comparison: 03/22/2010  Findings: There is no evidence of acute intracranial hemorrhage, brain edema, mass lesion, acute infarction,   mass effect, or midline shift. Acute infarct may be inapparent on noncontrast CT.  No other intra- axial abnormalities are seen, and the ventricles and sulci are within normal limits in size and symmetry.   No abnormal extra- axial fluid collections or masses are identified.  No significant calvarial abnormality.  IMPRESSION: 1. Negative for bleed or other acute intracranial process.   Original Report Authenticated By: D. Andria Rhein, MD    Dg Abd 2 Views  02/19/2012  *RADIOLOGY REPORT*  Clinical Data: Abdominal pain.  ABDOMEN - 2 VIEW  Comparison: Scout image for abdominal CT scan dated 09/09/2005  Findings: There is no free air.  There is air throughout multiple nondistended loops of large and small bowel.  Gallbladder has been removed.  No acute osseous abnormality.  IMPRESSION: Benign-appearing abdomen.   Original Report Authenticated By: Francene Boyers, M.D.     Scheduled Meds:    . aspirin EC  81 mg Oral Daily  . citalopram  40 mg Oral Daily  . docusate sodium  100 mg Oral BID  . enoxaparin (LOVENOX) injection   80 mg Subcutaneous Q24H  . estrogens (conjugated)  0.625 mg Oral Daily  . fentaNYL  50 mcg Transdermal Q72H  . gabapentin  300 mg Oral TID  . losartan  50 mg Oral Daily  . mometasone-formoterol  2 puff Inhalation BID  . montelukast  10 mg Oral Daily  . pantoprazole  40 mg Oral Daily  . senna  2 tablet Oral QHS  . sodium chloride  3 mL Intravenous Q12H  . tiotropium  18 mcg Inhalation Daily   Continuous Infusions:   Principal Problem:  *Syncope and collapse  Active Problems:  Morbid obesity  Constipation  Chest pain    Time spent: > 30 minutes    Penny Pia  [facility] Pager [phone]. If 8PM-8AM, please contact night-coverage at [email or web address], password [facility] 02/21/2012, 6:14 PM  LOS: 2 days

## 2012-02-21 NOTE — Progress Notes (Signed)
Patient has a bed available at [facility] tomorrow. Patient aware. CSW will facilitate discharge tomorrow if medically stable.   Clinical Social Work Department CLINICAL SOCIAL WORK PLACEMENT NOTE 02/21/2012  Patient:  [name], [name]  Account Number:  [id number] Admit date:  02/19/2012  Clinical Social Worker:  Orpah Greek  Date/time:  02/21/2012 02:53 PM  Clinical Social Work is seeking post-discharge placement for this patient at the following level of care:   SKILLED NURSING   (*CSW will update this form in Epic as items are completed)   02/21/2012  Patient/family provided with [facility] Department of Clinical Social Work's list of facilities offering this level of care within the geographic area requested by the patient (or if unable, by the patient's family).  02/21/2012  Patient/family informed of their freedom to choose among providers that offer the needed level of care, that participate in Medicare, Medicaid or managed care program needed by the patient, have an available bed and are willing to accept the patient.  02/21/2012  Patient/family informed of [facility]' ownership interest in [facility], as well as of the fact that they are under no obligation to receive care at this facility.  PASARR submitted to EDS on 02/21/2012 PASARR number received from [employer] on 02/21/2012  FL2 transmitted to all facilities in geographic area requested by pt/family on  02/21/2012 FL2 transmitted to all facilities within larger geographic area on   Patient informed that his/her managed care company has contracts with or will negotiate with  certain facilities, including the following:     Patient/family informed of bed offers received:  02/21/2012 Patient chooses bed at [facility] Physician recommends and patient chooses bed at    Patient to be transferred to [facility] on   Patient to be transferred to facility by [facility]  The  following physician request were entered in Epic:   Additional Comments:  Unice Bailey, LCSW [facility] Clinical Social Worker cell #: [phone]

## 2012-02-21 NOTE — Progress Notes (Signed)
Clinical Social Work Department BRIEF PSYCHOSOCIAL ASSESSMENT 02/21/2012  Patient:  [name], [name]     Account Number:  [id number]     Admit date:  02/19/2012  Clinical Social Worker:  Orpah Greek  Date/Time:  02/21/2012 02:49 PM  Referred by:  Physician  Date Referred:  02/21/2012 Referred for  SNF Placement   Other Referral:   Interview type:  Patient Other interview type:    PSYCHOSOCIAL DATA Living Status:  FAMILY Admitted from facility:   Level of care:   Primary support name:  [name] (daughter) h#: [phone] Primary support relationship to patient:  CHILD, ADULT Degree of support available:   good    CURRENT CONCERNS Current Concerns  Post-Acute Placement   Other Concerns:    SOCIAL WORK ASSESSMENT / PLAN CSW spoke with patient re: discharge planning. Note PT recommending SNF at discharge - patient is agreeable.   Assessment/plan status:  Information/Referral to [facility] Other assessment/ plan:   Information/referral to community resources:   CSW completed FL2 and faxed information out to [name] - provided bed offers.    PATIENT'S/FAMILY'S RESPONSE TO PLAN OF CARE: Patient expressed interest in staying close to home [name]). CSW awaiting call back from [name] in [place], patient states that [name] would be her backup.        Unice Bailey, LCSW [facility] Clinical Social Worker cell #: [phone]

## 2012-02-22 MED ORDER — POLYETHYLENE GLYCOL 3350 17 G PO PACK: 17.0000 g | PACK | Freq: Every day | ORAL | Status: DC

## 2012-02-22 MED ORDER — POLYETHYLENE GLYCOL 3350 17 G PO PACK
17.0000 g | PACK | Freq: Every day | ORAL | Status: DC
  Filled 2012-02-22: qty 1

## 2012-02-22 MED ORDER — DSS 100 MG PO CAPS: 100.0000 mg | ORAL_CAPSULE | Freq: Two times a day (BID) | ORAL | Status: DC

## 2012-02-22 MED ORDER — OXYCODONE HCL 10 MG PO TABS
10.0000 mg | ORAL_TABLET | Freq: Four times a day (QID) | ORAL | Status: DC | PRN
Start: 2012-02-22 — End: 2012-05-07

## 2012-02-22 MED ORDER — SENNA 8.6 MG PO TABS: 2.0000 | ORAL_TABLET | Freq: Every day | ORAL | Status: DC

## 2012-02-22 NOTE — Progress Notes (Signed)
Patient is set to discharge to [facility] SNF today. Patient aware. [facility] called for 3:30 pickup. Discharge packet given to RN to call report.   Clinical Social Work Department CLINICAL SOCIAL WORK PLACEMENT NOTE 02/22/2012  Patient:  [name], [name]  Account Number:  [id number] Admit date:  02/19/2012  Clinical Social Worker:  Orpah Greek  Date/time:  02/21/2012 02:53 PM  Clinical Social Work is seeking post-discharge placement for this patient at the following level of care:   SKILLED NURSING   (*CSW will update this form in Epic as items are completed)   02/21/2012  Patient/family provided with [facility] Department of Clinical Social Work's list of facilities offering this level of care within the geographic area requested by the patient (or if unable, by the patient's family).  02/21/2012  Patient/family informed of their freedom to choose among providers that offer the needed level of care, that participate in Medicare, Medicaid or managed care program needed by the patient, have an available bed and are willing to accept the patient.  02/21/2012  Patient/family informed of [facility]' ownership interest in [facility], as well as of the fact that they are under no obligation to receive care at this facility.  PASARR submitted to EDS on 02/21/2012 PASARR number received from EDS on 02/21/2012  FL2 transmitted to all facilities in geographic area requested by pt/family on  02/21/2012 FL2 transmitted to all facilities within larger geographic area on   Patient informed that his/her managed care company has contracts with or will negotiate with  certain facilities, including the following:     Patient/family informed of bed offers received:  02/21/2012 Patient chooses bed at [facility] Physician recommends and patient chooses bed at    Patient to be transferred to [facility] on  02/22/2012 Patient to be transferred to  facility by [facility]  The following physician request were entered in Epic:   Additional Comments:  Unice Bailey, LCSW [facility] Clinical Social Worker cell #: [phone]
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
## 2012-02-23 ENCOUNTER — Telehealth: Payer: Self-pay

## 2012-02-23 NOTE — Telephone Encounter (Signed)
Patient called to let us know she is now in a rehab center for 20 days.  What would you like to do with her mediations?

## 2012-02-24 NOTE — Telephone Encounter (Signed)
She broke her foot back in October and went to [facility] ER and they sent her home with directions to see [facility]. They did not make any arrangements for help and she has laid in bed unable to get up and put weight on her foot since that time. (or see [facility]) Just recently, [facility] had to have her sent to a rehab unit to try and get back on her feet.

## 2012-02-24 NOTE — Telephone Encounter (Signed)
What was she in "rehab" for?  We can refill her meds if she needs them filled as long as the "rehab" didn't have to do with abuse of her meds

## 2012-02-27 ENCOUNTER — Telehealth: Payer: Self-pay | Admitting: *Deleted

## 2012-02-27 NOTE — Telephone Encounter (Signed)
FYI  just letting us know that she is at [name] in [place]. She will be there for 20 days or more if needed and will call when time for pain med refill (~ 03/14/12).  She has lost 15 lbs!

## 2012-04-20 ENCOUNTER — Other Ambulatory Visit: Payer: Self-pay | Admitting: Physical Medicine & Rehabilitation

## 2012-04-23 ENCOUNTER — Telehealth: Payer: Self-pay

## 2012-04-23 NOTE — Telephone Encounter (Signed)
Patient has been discharge from the rehab facility and still cannot walk.  She still needs her medications and would like to stay a patient but she says she still cannot come in.  Please advise.

## 2012-04-24 NOTE — Telephone Encounter (Signed)
We can fill for pick up one more time. Going forward she will need to be seen here. i see she is no longer on fentanyl. i will not be prescribing large quantities of percocet in lieu of the fentanyl patch.

## 2012-04-24 NOTE — Telephone Encounter (Signed)
Patient says she is using oxycodone and fentanyl.  She does not need these filled yet.  She was advised she must be seen.  She will schedule an appointment.

## 2012-05-04 ENCOUNTER — Telehealth: Payer: Self-pay

## 2012-05-04 DIAGNOSIS — M47816 Spondylosis without myelopathy or radiculopathy, lumbar region: Secondary | ICD-10-CM

## 2012-05-04 DIAGNOSIS — Z79899 Other long term (current) drug therapy: Secondary | ICD-10-CM

## 2012-05-04 DIAGNOSIS — Z5181 Encounter for therapeutic drug level monitoring: Secondary | ICD-10-CM

## 2012-05-04 DIAGNOSIS — M170 Bilateral primary osteoarthritis of knee: Secondary | ICD-10-CM

## 2012-05-04 NOTE — Telephone Encounter (Signed)
Patient returned call

## 2012-05-04 NOTE — Telephone Encounter (Signed)
I spoke with [name]. We are going to have [facility] collect a urine from her and we will write her rxs Monday.

## 2012-05-07 ENCOUNTER — Encounter: Payer: Medicare Other | Admitting: Physical Medicine and Rehabilitation

## 2012-05-07 MED ORDER — FENTANYL 50 MCG/HR TD PT72: 1.0000 | MEDICATED_PATCH | TRANSDERMAL | Status: DC

## 2012-05-07 MED ORDER — OXYCODONE HCL 10 MG PO TABS: 10.0000 mg | ORAL_TABLET | Freq: Four times a day (QID) | ORAL | Status: DC | PRN

## 2012-05-07 NOTE — Telephone Encounter (Signed)
Rx's printed for Jadene's family member to pick up and verbal order and paperwork faxed to [facility] for the collection of UDS due to Jadae's inability to get OOB and in to office after d/c from rehab facility for broken leg.

## 2012-05-07 NOTE — Telephone Encounter (Signed)
Notified [facility] rxs available for pick up.

## 2012-05-08 ENCOUNTER — Telehealth: Payer: Self-pay | Admitting: *Deleted

## 2012-05-08 NOTE — Telephone Encounter (Signed)
[name]: Arrived to collect UDS and [name] had urinated 5 minutes before.  Waited an hour after giving her water but unable to  Due to incontinence.  Requesting to go back out and try and collect using i&o cath if necessary.  Approval given.

## 2012-05-29 ENCOUNTER — Other Ambulatory Visit: Payer: Self-pay | Admitting: Physical Medicine & Rehabilitation

## 2012-11-08 ENCOUNTER — Other Ambulatory Visit: Payer: Self-pay | Admitting: *Deleted

## 2012-11-08 MED ORDER — OXYCODONE HCL 10 MG PO TABS: ORAL_TABLET | ORAL | Status: DC

## 2013-03-26 ENCOUNTER — Ambulatory Visit: Payer: Medicare Other | Admitting: Gastroenterology

## 2013-04-29 ENCOUNTER — Ambulatory Visit: Payer: Medicare Other | Admitting: Gastroenterology

## 2013-05-27 ENCOUNTER — Encounter: Payer: Self-pay | Admitting: Gastroenterology

## 2013-05-27 ENCOUNTER — Ambulatory Visit (INDEPENDENT_AMBULATORY_CARE_PROVIDER_SITE_OTHER): Payer: PRIVATE HEALTH INSURANCE | Admitting: Gastroenterology

## 2013-05-27 ENCOUNTER — Encounter (INDEPENDENT_AMBULATORY_CARE_PROVIDER_SITE_OTHER): Payer: Self-pay

## 2013-05-27 VITALS — BP 125/71 | HR 68 | Temp 97.5°F | Ht 67.0 in | Wt >= 6400 oz

## 2013-05-27 DIAGNOSIS — J387 Other diseases of larynx: Secondary | ICD-10-CM

## 2013-05-27 DIAGNOSIS — K219 Gastro-esophageal reflux disease without esophagitis: Secondary | ICD-10-CM | POA: Insufficient documentation

## 2013-05-27 DIAGNOSIS — K5900 Constipation, unspecified: Secondary | ICD-10-CM

## 2013-05-27 DIAGNOSIS — D649 Anemia, unspecified: Secondary | ICD-10-CM

## 2013-05-27 NOTE — Progress Notes (Signed)
Primary Care Physician:  Josue HectorNYLAND,LEONARD ROBERT, MD  Primary Gastroenterologist:    Chief Complaint  Patient presents with  . Gastrophageal Reflux    HPI:  [name] is a 58 y.o. female here at the recommendations of a speech therapist who recently performed fiberoptic endoscopic evaluation of swallowing on patient. She was noted to have some mild oral dysphagia characterized by spillage family clips, mixed mechanical soft, solids before the swallow with consistent pre-swallow penetration with all by mouth trials, some backflow of thinly quitted and cracker particulate into the UES base after swallowing. There is concern for LPR based on this study. GI referral recommended due to these findings and rule out esophageal dysphagia and/or soft tissue motility issues.   Patient complains of chronic GERD. Two months ago, bad spell. Got to coughing first, then had vomiting. Vomit up acid. Couple of days could not eat anything. Couple of days later, felt a little hard to swallow. Started having problems swallowing larger pills. Her swallowing has been doing better. She's been following recommendations per speech therapy. These include eating slower, using sips inbetween bites.    She complains of daily heartburn. Some days worse than others. Some nausea right before meals but does not impede her eating. She has been bedridden for 4 years due to neurological/spinal issues. [facility], surgery not an option due to her weight and according to the patient the fact that the damage is likely permanent.  Patient complains of chronic constipation. Currently taking Colace, Dulcolax, senna. Not clear she takes all of these daily or some is when necessary. States she previously tried Clinical biochemistMiraLax without good benefit. Patient has never had an EGD or colonoscopy. She no she's been on Reglan for about 6 months she believes for nausea, early satiety, reflux. She is on baclofen for diffuse muscle spasms. She denies  any melena or rectal bleeding.  Current Outpatient Prescriptions  Medication Sig Dispense Refill  . acetaminophen (TYLENOL) 500 MG tablet Take 1,000 mg by mouth every 6 (six) hours as needed. For pain.      Marland Kitchen. albuterol (PROVENTIL) (2.5 MG/3ML) 0.083% nebulizer solution Take 2.5 mg by nebulization every 6 (six) hours as needed for wheezing or shortness of breath.      . ALPRAZolam (XANAX) 1 MG tablet Take 0.5 mg by mouth 4 (four) times daily as needed. For anxiety.      . baclofen (LIORESAL) 10 MG tablet Take 10 mg by mouth 3 (three) times daily.      . bisacodyl (DULCOLAX) 10 MG suppository Place 10 mg rectally. Insert 10 mg rectally at bedtime every Wednesday and Saturday for constipation      . bisacodyl (DULCOLAX) 5 MG EC tablet Take 10 mg by mouth daily as needed for moderate constipation.      . Cholecalciferol (VITAMIN D-3 PO) Take 50,000 Units by mouth every 30 (thirty) days.      . citalopram (CELEXA) 40 MG tablet Take 40 mg by mouth daily.      . cyclobenzaprine (FLEXERIL) 10 MG tablet Take 1 tablet (10 mg total) by mouth 3 (three) times daily as needed for muscle spasms.  60 tablet  2  . diphenhydrAMINE (SOMINEX) 25 MG tablet Take 37.5 mg by mouth 4 (four) times daily as needed for itching or sleep.       Marland Kitchen. docusate sodium 100 MG CAPS Take 100 mg by mouth 2 (two) times daily.  10 capsule    . estrogens, conjugated, (PREMARIN) 0.625 MG tablet  Take 0.625 mg by mouth daily. Take daily for 21 days then do not take for 7 days.      . fentaNYL (DURAGESIC) 50 MCG/HR Place 1 patch (50 mcg total) onto the skin every 3 (three) days.  5 patch  0  . ferrous sulfate 325 (65 FE) MG tablet Take 325 mg by mouth 2 (two) times daily with a meal.       . Fluticasone-Salmeterol (ADVAIR) 500-50 MCG/DOSE AEPB Inhale 1 puff into the lungs every 12 (twelve) hours.      . furosemide (LASIX) 40 MG tablet Take 40 mg by mouth 2 (two) times daily.      Marland Kitchen gabapentin (NEURONTIN) 300 MG capsule Take 1 capsule (300 mg  total) by mouth 3 (three) times daily.  270 capsule  0  . lactulose (CEPHULAC) 20 G packet Take 30 g by mouth daily as needed.       Marland Kitchen losartan (COZAAR) 50 MG tablet Take 50 mg by mouth daily.      . metoCLOPramide (REGLAN) 10 MG tablet Take 10 mg by mouth 4 (four) times daily -  before meals and at bedtime.      . montelukast (SINGULAIR) 10 MG tablet Take 10 mg by mouth daily.      Marland Kitchen morphine (ROXANOL) 20 MG/ML concentrated solution Take 5 mg by mouth as needed for severe pain.      . naproxen (NAPROSYN) 250 MG tablet Take 250 mg by mouth 2 (two) times daily with a meal.      . nitroGLYCERIN (NITROSTAT) 0.4 MG SL tablet Place 0.4 mg under the tongue every 5 (five) minutes as needed for chest pain.      Marland Kitchen omeprazole (PRILOSEC) 20 MG capsule Take 40 mg by mouth 2 (two) times daily.       . Oxycodone HCl 10 MG TABS Take 10 mg by mouth every 8 (eight) hours as needed.       . Oxycodone HCl 10 MG TABS Take 10 mg by mouth every 6 (six) hours as needed. Take 1 tablet every 8 hours as needed for pain.      . pantoprazole (PROTONIX) 40 MG tablet Take 40 mg by mouth 2 (two) times daily.      . polyethylene glycol (MIRALAX / GLYCOLAX) packet Take 17 g by mouth daily.  14 each    . potassium chloride SA (K-DUR,KLOR-CON) 20 MEQ tablet Take 20 mEq by mouth daily.      Marland Kitchen senna (SENOKOT) 8.6 MG TABS Take 2 tablets (17.2 mg total) by mouth at bedtime.  120 each    . Sertraline HCl (ZOLOFT PO) Take 150 mg by mouth at bedtime.      . simethicone (MYLICON) 80 MG chewable tablet Chew 80 mg by mouth every 6 (six) hours as needed for flatulence.      . sucralfate (CARAFATE) 1 G tablet Take 1 g by mouth 3 (three) times daily.       Marland Kitchen tiotropium (SPIRIVA) 18 MCG inhalation capsule Place 18 mcg into inhaler and inhale daily.      . diclofenac sodium (VOLTAREN) 1 % GEL Apply 1 application topically 4 (four) times daily as needed. For joint pain.       No current facility-administered medications for this visit.     Allergies as of 05/27/2013 - Review Complete 05/27/2013  Allergen Reaction Noted  . Sulfa antibiotics Itching and Swelling 03/22/2010    Past Medical History  Diagnosis Date  . Hypertension   .  Thoracic or lumbosacral neuritis or radiculitis, unspecified   . Lumbago   . Obesity   . Primary localized osteoarthrosis, lower leg   . Facet syndrome, lumbar   . 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 GERD (gastroesophageal reflux disease)   . Depression   . Anxiety     Past Surgical History  Procedure Laterality Date  . Abdominal hysterectomy    . Cesarean section      x2  . Cholecystectomy      Family History  Problem Relation Age of Onset  . Asthma Mother   . COPD Mother   . Heart disease Mother   . Heart disease Father   . Colon cancer Neg Hx     History   Social History  . Marital Status: Married    Spouse Name: N/A    Number of Children: 2  . Years of Education: N/A   Occupational History  . DISALBED    Social History Main Topics  . Smoking status: Never Smoker   . Smokeless tobacco: Never Used  . Alcohol Use: No  . Drug Use: No  . Sexual Activity: No   Other Topics Concern  . Not on file   Social History Narrative  . No narrative on file      ROS:  General: Negative for anorexia, weight loss, fever, chills. Chronic fatigue, weakness. Eyes: Negative for vision changes.  ENT: Negative for hoarseness,  nasal congestion. See hpi. CV: Negative for chest pain, angina, palpitations, dyspnea on exertion, peripheral edema.  Respiratory: Negative for dyspnea at rest, dyspnea on exertion, cough, sputum, wheezing.  GI: See history of present illness. GU:  Negative for dysuria, hematuria, urinary incontinence, urinary frequency, nocturnal urination.  MS: chronic back and joint pain Derm: Negative for rash or itching.  Neuro: Negative for weakness, abnormal sensation, seizure, frequent headaches, memory loss, confusion.  Psych:  Negative for anxiety, depression, suicidal ideation, hallucinations.  Endo: Negative for unusual weight change.  Heme: Negative for bruising or bleeding. Allergy: Negative for rash or hives.    Physical Examination:  BP 125/71  Pulse 68  Temp(Src) 97.5 F (36.4 C) (Oral)  Ht 5\' 7"  (1.702 m)  Wt 412 lb (186.882 kg)  BMI 64.51 kg/m2   General: morbidly obese WF, arrives via EMS for transport due to requirement of stretcher and her size. Well-nourished, well-developed in no acute distress.  Head: Normocephalic, atraumatic.   Eyes: Conjunctiva pink, no icterus. Mouth: Oropharyngeal mucosa moist and pink , no lesions erythema or exudate. Neck: Supple without thyromegaly, masses, or lymphadenopathy.  Lungs: Clear to auscultation bilaterally.  Heart: Regular rate and rhythm, no murmurs rubs or gallops.  Abdomen: Bowel sounds are normal, nontender, nondistended, no hepatosplenomegaly or masses, no abdominal bruits or    hernia , no rebound or guarding.  Massively obese, limits exam signficantly. Rectal: not performed Extremities: 1+ lower extremity edema. No clubbing or deformities.  Neuro: Alert and oriented x 4 , grossly normal neurologically.  Skin: Warm and dry, no rash or jaundice.   Psych: Alert and cooperative, normal mood and affect.  Labs: Labs from 02/28/2013. White blood cell count 8600, hemoglobin 11.8 normal, hematocrit 37.5 normal, MCV 71 low, platelets 320,000, creatinine 0.67, calcium 8.3 low, hemoglobin A1c 6.4  Last December 2014, iron 21 low, TIBC 297, iron saturation 7% low, B12 440, folate 10.1, ferritin 25.  Labs from every February 2015. Creatinine 0.7 calcium 8.6, hemoglobin 11.5 normal, MCV 73 low,  platelets 307000, white blood cell count 9500, iron 24 low, iron saturations 10% low, TIBC 239, Ferritin 44, folate 12.1, B12 380.  Imaging Studies: No results found.

## 2013-05-27 NOTE — Patient Instructions (Signed)
1. I will discuss your case further with Dr. Jena Gaussourk. You need to have your esophagus evaluated but we need to consider risk of sedation given your multiple medical problems and size. I will let you know next step. 2. We will try to get your insurance to cover prescription medication for constipation. Once this has been improved we will wean you off some of your other medications for constipation. 3. Please be careful to not over eat, limit carbonated beverages and caffeine, spicy/greasy foods. Sit upright for two hours after all meals and snacks to promote good emptying of stomach and eliminate acid exposure to esophagus.   Diet for Gastroesophageal Reflux Disease, Adult Reflux (acid reflux) is when acid from your stomach flows up into the esophagus. When acid comes in contact with the esophagus, the acid causes irritation and soreness (inflammation) in the esophagus. When reflux happens often or so severely that it causes damage to the esophagus, it is called gastroesophageal reflux disease (GERD). Nutrition therapy can help ease the discomfort of GERD. FOODS OR DRINKS TO AVOID OR LIMIT  Smoking or chewing tobacco. Nicotine is one of the most potent stimulants to acid production in the gastrointestinal tract.  Caffeinated and decaffeinated coffee and black tea.  Regular or low-calorie carbonated beverages or energy drinks (caffeine-free carbonated beverages are allowed).   Strong spices, such as black pepper, white pepper, red pepper, cayenne, curry powder, and chili powder.  Peppermint or spearmint.  Chocolate.  High-fat foods, including meats and fried foods. Extra added fats including oils, butter, salad dressings, and nuts. Limit these to less than 8 tsp per day.  Fruits and vegetables if they are not tolerated, such as citrus fruits or tomatoes.  Alcohol.  Any food that seems to aggravate your condition. If you have questions regarding your diet, call your caregiver or a registered  dietitian. OTHER THINGS THAT MAY HELP GERD INCLUDE:   Eating your meals slowly, in a relaxed setting.  Eating 5 to 6 small meals per day instead of 3 large meals.  Eliminating food for a period of time if it causes distress.  Not lying down until 3 hours after eating a meal.  Keeping the head of your bed raised 6 to 9 inches (15 to 23 cm) by using a foam wedge or blocks under the legs of the bed. Lying flat may make symptoms worse.  Being physically active. Weight loss may be helpful in reducing reflux in overweight or obese adults.  Wear loose fitting clothing EXAMPLE MEAL PLAN This meal plan is approximately 2,000 calories based on https://www.[place].org/ChooseMyPlate.gov meal planning guidelines. Breakfast   cup cooked oatmeal.  1 cup strawberries.  1 cup low-fat milk.  1 oz almonds. Snack  1 cup cucumber slices.  6 oz yogurt (made from low-fat or fat-free milk). Lunch  2 slice whole-wheat bread.  2 oz sliced [place].  2 tsp mayonnaise.  1 cup blueberries.  1 cup snap peas. Snack  6 whole-wheat crackers.  1 oz string cheese. Dinner   cup brown rice.  1 cup mixed veggies.  1 tsp olive oil.  3 oz grilled fish. Document Released: 01/24/2005 Document Revised: 04/18/2011 Document Reviewed: 12/10/2010 [facility] Patient Information 2014 [place], [place].

## 2013-05-28 ENCOUNTER — Telehealth: Payer: Self-pay | Admitting: Gastroenterology

## 2013-05-28 ENCOUNTER — Encounter: Payer: Self-pay | Admitting: Gastroenterology

## 2013-05-28 DIAGNOSIS — D649 Anemia, unspecified: Secondary | ICD-10-CM | POA: Insufficient documentation

## 2013-05-28 MED ORDER — LUBIPROSTONE 24 MCG PO CAPS: 24.0000 ug | ORAL_CAPSULE | Freq: Two times a day (BID) | ORAL | Status: DC

## 2013-05-28 NOTE — Assessment & Plan Note (Addendum)
58 y/o morbidly obese female with complaints of refractory heartburn, some vague swallowing issues, abnormal ST evaluation as detailed above. Would recommend evaluation of esophagus for stricture. Will discuss further with Dr. Jena Gaussourk. Her size may prevent endoscopic evaluation and esophagram likely impossible because of her size and bedridden state.   I need to also clarify current PPI. Prilosec 40mg  BID and Protonix 40mg  BID are both listed on med list provided to us today.   Further recommendations to follow. We did discuss antireflux measures. Need to sit upright at least 2 hours after meals to promote motility and allow gravity to help.

## 2013-05-28 NOTE — Telephone Encounter (Signed)
She is taking the Prilosec 40 mg BID and Colace 2 BID and the Dulcolax is PRN

## 2013-05-28 NOTE — Progress Notes (Signed)
cc'd to pcp 

## 2013-05-28 NOTE — Assessment & Plan Note (Signed)
Patient with chronic anemia and on iron therapy. Ferritin has improved. Her anemia profile looks mixed, likely in part due to anemia of chronic disease. Patient has never had colonoscopy and not likely to be able to tolerate prep at this point due to bedridden status and morbid obesity. Possible EGD for other reasons, once discussed with Dr. Jena Gaussourk.

## 2013-05-28 NOTE — Assessment & Plan Note (Signed)
Complains of inadequate control of constipation. Only multiple agents. Would likely benefit from Linzess or Amitiza if we can get approved. Need to clarify current bowel regimen.

## 2013-05-28 NOTE — Telephone Encounter (Signed)
OK. We are going to continue the Prilosec 40mg  BID. Let's continue the Colace 2 BID. Add Amitiza 24mcg BID. RX sent to pharmacy.   Please let nursing home know of new order.  If she gets good BMs on Amitiza we can send order to d/c colace.

## 2013-05-28 NOTE — Telephone Encounter (Signed)
Please clarify patient's meds for GERD and constipation. It was not clear on med list received yesterday at [facility].  Is she taking both Prilosec and Protonix? What dose and frequency?  How is she taking Dulcolax PO, Dulcolax PR, Colace, Miralax, lactulose, Senna? Please not if PRN or scheduled.

## 2013-05-28 NOTE — Telephone Encounter (Signed)
[facility] and her nurse is aware

## 2013-06-12 ENCOUNTER — Telehealth: Payer: Self-pay | Admitting: Gastroenterology

## 2013-06-12 NOTE — Telephone Encounter (Signed)
She is over the weight limit the table only holds 400 and foot part is 300 lbs per Radiology

## 2013-06-12 NOTE — Telephone Encounter (Signed)
Please find out from radiology if patient can have barium pill esophagram. (Dx: dysphagia). Poor endoscopy candidate.  She is 412 pounds and bedridden. Has previously transported via EMS/stretcher transport.

## 2013-06-18 ENCOUNTER — Telehealth: Payer: Self-pay | Admitting: Gastroenterology

## 2013-06-18 ENCOUNTER — Other Ambulatory Visit: Payer: Self-pay | Admitting: Internal Medicine

## 2013-06-18 NOTE — Telephone Encounter (Signed)
Discussed with Dr. Jena Gaussourk. EGD +/- ED in OR secondary to polypharmacy. Ddx: refractory gerd, dysphagia, nausea  Please verify that patient is not on any blood thinners, none currently listed but she has h/o DVT.

## 2013-06-18 NOTE — Telephone Encounter (Signed)
Pt is aware. Ok to schedule. [facility] is a [facility].  Benedetto GoadLeighAnn, please contact Charlena [facility]Stephanie Adkins [facility] to schedule this.  Pt is not on any blood thinners.

## 2013-06-18 NOTE — Telephone Encounter (Signed)
EGD+/-ED in OR w/RMR is scheduled on Thursday June 11 at 9 and [facility] [place]

## 2013-06-19 NOTE — Telephone Encounter (Signed)
Noted, I put a reminder on the afternoon before

## 2013-06-19 NOTE — Telephone Encounter (Addendum)
Sounds good. [name], please note game plan as per RMR.  Is there anyway we can put this on my schedule somewhere to remind me to go to endo that morning?

## 2013-06-19 NOTE — Telephone Encounter (Signed)
Nothing before H&P is up in the OR

## 2013-06-19 NOTE — Telephone Encounter (Signed)
It is not an updated H&P after 30 days,  it is a new H&P - endoscopy schedule is not set up for that kind of delay. May go ahead and schedule after 30 days. Just arrange for the extender on duty that day to come do a new  H&P -  that will be a work around avoiding having the patient return to the office.

## 2013-06-19 NOTE — Telephone Encounter (Signed)
That is not within 30 days from OV. Anything else we can do, this patient came to office via EMS/stretcher and really don't want to have to bring her back just for updated H+P.

## 2013-07-01 ENCOUNTER — Encounter: Payer: Self-pay | Admitting: Gastroenterology

## 2013-07-03 ENCOUNTER — Encounter (HOSPITAL_COMMUNITY): Payer: Self-pay | Admitting: Pharmacy Technician

## 2013-07-10 NOTE — Patient Instructions (Signed)
[name]  07/10/2013   Your procedure is scheduled on:  07/18/13  [facility].  Call this number if you have problems the morning of surgery: [phone]   Remember:   Do not eat food or drink liquids after midnight.   Take these medicines the morning of surgery with A SIP OF WATER: Amitiza, Reglan, Singulair, Morphine, Prilosec, Carafate, Zoloft, Albuterol, Xanax, Baclofen, Fentanyl patch, Iron,  Gabapentin, Cozaar, Zanaflex   Do not wear jewelry, make-up or nail polish.  Do not wear lotions, powders, or perfumes. You may wear deodorant.  Do not shave 48 hours prior to surgery. Men may shave face and neck.  Do not bring valuables to the hospital.  [facility] not responsible for any belongings or valuables.               Contacts, dentures or bridgework may not be worn into surgery.  Leave suitcase in the car. After surgery it may be brought to your room.  For patients admitted to the hospital, discharge time is determined by your                treatment team.               Patients discharged the day of surgery will not be allowed to drive  home.  Name and phone number of your driver:   Special Instructions: N/A   Please read over the following fact sheets that you were given: Anesthesia Post-op Instructions   PATIENT INSTRUCTIONS POST-ANESTHESIA  IMMEDIATELY FOLLOWING SURGERY:  Do not drive or operate machinery for the first twenty four hours after surgery.  Do not make any important decisions for twenty four hours after surgery or while taking narcotic pain medications or sedatives.  If you develop intractable nausea and vomiting or a severe headache please notify your doctor immediately.  FOLLOW-UP:  Please make an appointment with your surgeon as instructed. You do not need to follow up with anesthesia unless specifically instructed to do so.  WOUND CARE INSTRUCTIONS (if applicable):  Keep a dry clean dressing on the anesthesia/puncture wound site  if there is drainage.  Once the wound has quit draining you may leave it open to air.  Generally you should leave the bandage intact for twenty four hours unless there is drainage.  If the epidural site drains for more than 36-48 hours please call the anesthesia department.  QUESTIONS?:  Please feel free to call your physician or the hospital operator if you have any questions, and they will be happy to assist you.      Esophagogastroduodenoscopy Esophagogastroduodenoscopy (EGD) is a procedure to examine the lining of the esophagus, stomach, and first part of the small intestine (duodenum). A long, flexible, lighted tube with a camera attached (endoscope) is inserted down the throat to view these organs. This procedure is done to detect problems or abnormalities, such as inflammation, bleeding, ulcers, or growths, in order to treat them. The procedure lasts about 5 20 minutes. It is usually an outpatient procedure, but it may need to be performed in emergency cases in the hospital. LET YOUR CAREGIVER KNOW ABOUT:   Allergies to food or medicine.  All medicines you are taking, including vitamins, herbs, eyedrops, and over-the-counter medicines and creams.  Use of steroids (by mouth or creams).  Previous problems you or members of your family have had with the use of anesthetics.  Any blood disorders you have.  Previous surgeries you  have had.  Other health problems you have.  Possibility of pregnancy, if this applies. RISKS AND COMPLICATIONS  Generally, EGD is a safe procedure. However, as with any procedure, complications can occur. Possible complications include:  Infection.  Bleeding.  Tearing (perforation) of the esophagus, stomach, or duodenum.  Difficulty breathing or not being able to breath.  Excessive sweating.  Spasms of the larynx.  Slowed heartbeat.  Low blood pressure. BEFORE THE PROCEDURE  Do not eat or drink anything for 6 8 hours before the procedure or as  directed by your caregiver.  Ask your caregiver about changing or stopping your regular medicines.  If you wear dentures, be prepared to remove them before the procedure.  Arrange for someone to drive you home after the procedure. PROCEDURE   A vein will be accessed to give medicines and fluids. A medicine to relax you (sedative) and a pain reliever will be given through that access into the vein.  A numbing medicine (local anesthetic) may be sprayed on your throat for comfort and to stop you from gagging or coughing.  A mouth guard may be placed in your mouth to protect your teeth and to keep you from biting on the endoscope.  You will be asked to lie on your left side.  The endoscope is inserted down your throat and into the esophagus, stomach, and duodenum.  Air is put through the endoscope to allow your caregiver to view the lining of your esophagus clearly.  The esophagus, stomach, and duodenum is then examined. During the exam, your caregiver may:  Remove tissue to be examined under a microscope (biopsy) for inflammation, infection, or other medical problems.  Remove growths.  Remove objects (foreign bodies) that are stuck.  Treat any bleeding with medicines or other devices that stop tissues from bleeding (hot cauters, clipping devices).  Widen (dilate) or stretch narrowed areas of the esophagus and stomach.  The endoscope will then be withdrawn. AFTER THE PROCEDURE  You will be taken to a recovery area to be monitored. You will be able to go home once you are stable and alert.  Do not eat or drink anything until the local anesthetic and numbing medicines have worn off. You may choke.  It is normal to feel bloated, have pain with swallowing, or have a sore throat for a short time. This will wear off.  Your caregiver should be able to discuss his or her findings with you. It will take longer to discuss the test results if any biopsies were taken. Document Released:  05/27/2004 Document Revised: 01/11/2012 Document Reviewed: 12/28/2011 [facility] Patient Information 2014 [place], [place].

## 2013-07-11 ENCOUNTER — Encounter (HOSPITAL_COMMUNITY)
Admission: RE | Admit: 2013-07-11 | Discharge: 2013-07-11 | Disposition: A | Discharge status: Home / Self Care | Payer: PRIVATE HEALTH INSURANCE | Source: Ambulatory Visit | Attending: Internal Medicine | Admitting: Internal Medicine

## 2013-07-11 DIAGNOSIS — Z01810 Encounter for preprocedural cardiovascular examination: Secondary | ICD-10-CM | POA: Insufficient documentation

## 2013-07-11 NOTE — Progress Notes (Signed)
Dr. Jena Gauss, do we need to initiate the referral?

## 2013-07-11 NOTE — Progress Notes (Signed)
Patient ID: [name], female   DOB: [birth year], 58 y.o.   MRN: [id number]  Patient seen and evaluated in preop. Seen by me and Dr. Jayme Cloud. Patient in the super obese category. Likely has untreated sleep apnea. Previously was evaluated here for cholecystectomy  - given her body habitus and comorbidities, surgery here was declined. Because of her multiple comorbidities and body habitus, she needs her GI evaluation to take place at a tertiary referral center such as [facility] or [facility]. I have discussed this with the patient. She was agreeable. I have also made this recommendation to Dr. Virgina Organ with paperwork going back to the nursing home.

## 2013-07-12 NOTE — Progress Notes (Signed)
Patient ID: [name], female   DOB: 03-13-1955, 58 y.o.   MRN: [id number]  Durward Mallard, I think it would be appropriate for [name] to start referral process.

## 2013-07-15 NOTE — Progress Notes (Signed)
Patient ID: [name], female   DOB: 1955/04/27, 58 y.o.   MRN: [id number]  Patient needs a referral to [facility] or [facility] for tcs   Routing to Soledad Gerlach

## 2013-07-15 NOTE — Progress Notes (Signed)
Referral has been made to [facility]  

## 2013-07-17 ENCOUNTER — Telehealth: Payer: Self-pay | Admitting: Internal Medicine

## 2013-07-17 ENCOUNTER — Encounter (HOSPITAL_COMMUNITY): Payer: Self-pay | Admitting: Anesthesiology

## 2013-07-17 NOTE — Telephone Encounter (Signed)
[name] is scheduled at [facility] with Dr. Merri Brunette on July 13th at 9:00 am

## 2013-07-18 ENCOUNTER — Ambulatory Visit (HOSPITAL_COMMUNITY): Admission: RE | Admit: 2013-07-18 | Payer: Medicare Other | Source: Ambulatory Visit | Admitting: Internal Medicine

## 2013-07-18 ENCOUNTER — Encounter (HOSPITAL_COMMUNITY): Admission: RE | Payer: Self-pay | Source: Ambulatory Visit

## 2013-07-18 SURGERY — ESOPHAGOGASTRODUODENOSCOPY (EGD) WITH PROPOFOL
Anesthesia: Monitor Anesthesia Care

## 2013-08-22 ENCOUNTER — Telehealth: Payer: Self-pay

## 2013-08-22 NOTE — Telephone Encounter (Signed)
Spoke with Crosby OysterKimberly Burkhart [facility]. [facility] refused [facility]. She does not want to pursue invasive testing.  Having difficult time with constipation. On Amitiza 24mcg BID, colace daily, taking dulcolax prn. Using enemas every couple of weeks. Patient does not ambulate.   Recommend switching Amitiza to Linzess 290mcg daily without food. Continue colace. Consider dulcolax 10mg  orally TIW if still having problems.   She will call with any further concerns.

## 2013-08-22 NOTE — Telephone Encounter (Signed)
Crosby OysterKimberly Burkhart [facility] called and left a voicemail. She would like to talk with [facility] about this pt who is currently on Amitiza and still having to use enemas and dulcolax every week to have a bm. She would like some further recommendations. Pt recently had an ileus and they would like to prevent this from happening again. Her phone number is [phone]

## 2014-09-25 ENCOUNTER — Other Ambulatory Visit (HOSPITAL_COMMUNITY): Payer: Self-pay | Admitting: Internal Medicine

## 2014-09-25 DIAGNOSIS — R519 Headache, unspecified: Secondary | ICD-10-CM

## 2014-09-25 DIAGNOSIS — R51 Headache: Principal | ICD-10-CM

## 2014-09-30 ENCOUNTER — Ambulatory Visit (HOSPITAL_COMMUNITY)
Admission: RE | Admit: 2014-09-30 | Discharge: 2014-09-30 | Disposition: A | Discharge status: Home / Self Care | Payer: Medicaid Other | Source: Ambulatory Visit | Attending: Internal Medicine | Admitting: Internal Medicine

## 2014-09-30 DIAGNOSIS — R110 Nausea: Secondary | ICD-10-CM | POA: Insufficient documentation

## 2014-09-30 DIAGNOSIS — R519 Headache, unspecified: Secondary | ICD-10-CM

## 2014-09-30 DIAGNOSIS — R51 Headache: Secondary | ICD-10-CM | POA: Insufficient documentation

## 2014-12-16 ENCOUNTER — Other Ambulatory Visit (HOSPITAL_COMMUNITY): Payer: Self-pay | Admitting: Internal Medicine

## 2014-12-16 DIAGNOSIS — R1032 Left lower quadrant pain: Secondary | ICD-10-CM

## 2014-12-26 ENCOUNTER — Ambulatory Visit (HOSPITAL_COMMUNITY): Admission: RE | Admit: 2014-12-26 | Payer: Medicaid Other | Source: Ambulatory Visit

## 2014-12-31 ENCOUNTER — Ambulatory Visit (HOSPITAL_COMMUNITY): Payer: Medicaid Other

## 2015-01-05 ENCOUNTER — Ambulatory Visit (HOSPITAL_COMMUNITY)
Admission: RE | Admit: 2015-01-05 | Discharge: 2015-01-05 | Disposition: A | Discharge status: Home / Self Care | Payer: Medicaid Other | Source: Ambulatory Visit | Attending: Internal Medicine | Admitting: Internal Medicine

## 2015-01-05 DIAGNOSIS — R918 Other nonspecific abnormal finding of lung field: Secondary | ICD-10-CM | POA: Insufficient documentation

## 2015-01-05 DIAGNOSIS — R1032 Left lower quadrant pain: Secondary | ICD-10-CM | POA: Diagnosis present

## 2015-01-05 DIAGNOSIS — K760 Fatty (change of) liver, not elsewhere classified: Secondary | ICD-10-CM | POA: Insufficient documentation

## 2015-01-05 LAB — POCT I-STAT CREATININE: Creatinine, Ser: 1 mg/dL (ref 0.44–1.00)

## 2015-01-05 MED ORDER — IOHEXOL 300 MG/ML  SOLN
150.0000 mL | Freq: Once | INTRAMUSCULAR | Status: AC | PRN
  Administered 2015-01-05: 150 mL via INTRAVENOUS

## 2015-01-14 ENCOUNTER — Other Ambulatory Visit (HOSPITAL_COMMUNITY): Payer: Self-pay | Admitting: Internal Medicine

## 2015-01-14 DIAGNOSIS — R935 Abnormal findings on diagnostic imaging of other abdominal regions, including retroperitoneum: Secondary | ICD-10-CM

## 2015-01-16 ENCOUNTER — Ambulatory Visit (HOSPITAL_COMMUNITY): Payer: Medicaid Other

## 2015-01-21 ENCOUNTER — Ambulatory Visit (HOSPITAL_COMMUNITY)
Admission: RE | Admit: 2015-01-21 | Discharge: 2015-01-21 | Disposition: A | Discharge status: Home / Self Care | Payer: Medicaid Other | Source: Ambulatory Visit | Attending: Internal Medicine | Admitting: Internal Medicine

## 2015-01-21 DIAGNOSIS — R918 Other nonspecific abnormal finding of lung field: Secondary | ICD-10-CM | POA: Diagnosis not present

## 2015-01-21 DIAGNOSIS — R935 Abnormal findings on diagnostic imaging of other abdominal regions, including retroperitoneum: Secondary | ICD-10-CM | POA: Diagnosis present

## 2015-09-03 ENCOUNTER — Other Ambulatory Visit (HOSPITAL_COMMUNITY): Payer: Self-pay | Admitting: Nurse Practitioner

## 2015-09-03 DIAGNOSIS — R112 Nausea with vomiting, unspecified: Secondary | ICD-10-CM

## 2015-09-04 ENCOUNTER — Ambulatory Visit (HOSPITAL_COMMUNITY)
Admission: RE | Admit: 2015-09-04 | Discharge: 2015-09-04 | Disposition: A | Discharge status: Home / Self Care | Payer: Medicare Other | Source: Ambulatory Visit | Attending: Nurse Practitioner | Admitting: Nurse Practitioner

## 2015-09-04 DIAGNOSIS — R918 Other nonspecific abnormal finding of lung field: Secondary | ICD-10-CM | POA: Diagnosis not present

## 2015-09-04 DIAGNOSIS — R112 Nausea with vomiting, unspecified: Secondary | ICD-10-CM | POA: Insufficient documentation

## 2015-09-07 ENCOUNTER — Inpatient Hospital Stay (HOSPITAL_COMMUNITY)
Admission: EM | Admit: 2015-09-07 | Discharge: 2015-09-12 | DRG: 640 | Disposition: A | Discharge status: Skilled Nursing Facility | Payer: Medicare Other | Attending: Internal Medicine | Admitting: Internal Medicine

## 2015-09-07 ENCOUNTER — Encounter (HOSPITAL_COMMUNITY): Payer: Self-pay | Admitting: Cardiology

## 2015-09-07 DIAGNOSIS — Z825 Family history of asthma and other chronic lower respiratory diseases: Secondary | ICD-10-CM

## 2015-09-07 DIAGNOSIS — Z79891 Long term (current) use of opiate analgesic: Secondary | ICD-10-CM

## 2015-09-07 DIAGNOSIS — G894 Chronic pain syndrome: Secondary | ICD-10-CM | POA: Diagnosis present

## 2015-09-07 DIAGNOSIS — R1110 Vomiting, unspecified: Secondary | ICD-10-CM

## 2015-09-07 DIAGNOSIS — Z7982 Long term (current) use of aspirin: Secondary | ICD-10-CM

## 2015-09-07 DIAGNOSIS — Z6841 Body Mass Index (BMI) 40.0 and over, adult: Secondary | ICD-10-CM

## 2015-09-07 DIAGNOSIS — F329 Major depressive disorder, single episode, unspecified: Secondary | ICD-10-CM | POA: Diagnosis present

## 2015-09-07 DIAGNOSIS — E876 Hypokalemia: Secondary | ICD-10-CM | POA: Diagnosis not present

## 2015-09-07 DIAGNOSIS — R1310 Dysphagia, unspecified: Secondary | ICD-10-CM | POA: Diagnosis present

## 2015-09-07 DIAGNOSIS — M1710 Unilateral primary osteoarthritis, unspecified knee: Secondary | ICD-10-CM | POA: Diagnosis present

## 2015-09-07 DIAGNOSIS — D539 Nutritional anemia, unspecified: Secondary | ICD-10-CM | POA: Diagnosis present

## 2015-09-07 DIAGNOSIS — K5909 Other constipation: Secondary | ICD-10-CM | POA: Diagnosis present

## 2015-09-07 DIAGNOSIS — I10 Essential (primary) hypertension: Secondary | ICD-10-CM | POA: Diagnosis present

## 2015-09-07 DIAGNOSIS — L8990 Pressure ulcer of unspecified site, unspecified stage: Secondary | ICD-10-CM | POA: Insufficient documentation

## 2015-09-07 DIAGNOSIS — R532 Functional quadriplegia: Secondary | ICD-10-CM | POA: Diagnosis present

## 2015-09-07 DIAGNOSIS — R9431 Abnormal electrocardiogram [ECG] [EKG]: Secondary | ICD-10-CM

## 2015-09-07 DIAGNOSIS — K5900 Constipation, unspecified: Secondary | ICD-10-CM | POA: Diagnosis present

## 2015-09-07 DIAGNOSIS — K219 Gastro-esophageal reflux disease without esophagitis: Secondary | ICD-10-CM | POA: Diagnosis present

## 2015-09-07 DIAGNOSIS — B370 Candidal stomatitis: Secondary | ICD-10-CM | POA: Diagnosis present

## 2015-09-07 DIAGNOSIS — M47816 Spondylosis without myelopathy or radiculopathy, lumbar region: Secondary | ICD-10-CM | POA: Diagnosis present

## 2015-09-07 DIAGNOSIS — Z86718 Personal history of other venous thrombosis and embolism: Secondary | ICD-10-CM

## 2015-09-07 DIAGNOSIS — T502X5A Adverse effect of carbonic-anhydrase inhibitors, benzothiadiazides and other diuretics, initial encounter: Secondary | ICD-10-CM | POA: Diagnosis present

## 2015-09-07 DIAGNOSIS — Z7401 Bed confinement status: Secondary | ICD-10-CM

## 2015-09-07 DIAGNOSIS — L89322 Pressure ulcer of left buttock, stage 2: Secondary | ICD-10-CM | POA: Diagnosis present

## 2015-09-07 DIAGNOSIS — G4733 Obstructive sleep apnea (adult) (pediatric): Secondary | ICD-10-CM | POA: Diagnosis present

## 2015-09-07 DIAGNOSIS — N390 Urinary tract infection, site not specified: Secondary | ICD-10-CM

## 2015-09-07 DIAGNOSIS — E860 Dehydration: Secondary | ICD-10-CM

## 2015-09-07 DIAGNOSIS — Z8249 Family history of ischemic heart disease and other diseases of the circulatory system: Secondary | ICD-10-CM

## 2015-09-07 LAB — URINE MICROSCOPIC-ADD ON

## 2015-09-07 LAB — COMPREHENSIVE METABOLIC PANEL
ALBUMIN: 2 g/dL — AB (ref 3.5–5.0)
ALT: 26 U/L (ref 14–54)
ANION GAP: 8 (ref 5–15)
AST: 57 U/L — AB (ref 15–41)
Alkaline Phosphatase: 102 U/L (ref 38–126)
BUN: 8 mg/dL (ref 6–20)
CHLORIDE: 89 mmol/L — AB (ref 101–111)
CO2: 34 mmol/L — AB (ref 22–32)
Calcium: 6.6 mg/dL — ABNORMAL LOW (ref 8.9–10.3)
Creatinine, Ser: 0.43 mg/dL — ABNORMAL LOW (ref 0.44–1.00)
GFR calc Af Amer: >60 mL/min (ref >60)
GFR calc non Af Amer: >60 mL/min (ref >60)
GLUCOSE: 96 mg/dL (ref 65–99)
POTASSIUM: 2.1 mmol/L — AB (ref 3.5–5.1)
SODIUM: 131 mmol/L — AB (ref 135–145)
TOTAL PROTEIN: 5.7 g/dL — AB (ref 6.5–8.1)
Total Bilirubin: 1.5 mg/dL — ABNORMAL HIGH (ref 0.3–1.2)

## 2015-09-07 LAB — CBC WITH DIFFERENTIAL/PLATELET
BASOS ABS: 0 10*3/uL (ref 0.0–0.1)
BASOS PCT: 0 %
EOS ABS: 0 10*3/uL (ref 0.0–0.7)
EOS PCT: 0 %
HCT: 34.3 % — ABNORMAL LOW (ref 36.0–46.0)
Hemoglobin: 12.3 g/dL (ref 12.0–15.0)
Lymphocytes Relative: 32 %
Lymphs Abs: 2.5 10*3/uL (ref 0.7–4.0)
MCH: 36.2 pg — ABNORMAL HIGH (ref 26.0–34.0)
MCHC: 35.9 g/dL (ref 30.0–36.0)
MCV: 100.9 fL — ABNORMAL HIGH (ref 78.0–100.0)
MONO ABS: 0.7 10*3/uL (ref 0.1–1.0)
MONOS PCT: 9 %
Neutro Abs: 4.5 10*3/uL (ref 1.7–7.7)
Neutrophils Relative %: 58 %
PLATELETS: 289 10*3/uL (ref 150–400)
RBC: 3.4 MIL/uL — ABNORMAL LOW (ref 3.87–5.11)
RDW: 21.2 % — AB (ref 11.5–15.5)
WBC: 7.8 10*3/uL (ref 4.0–10.5)

## 2015-09-07 LAB — URINALYSIS, ROUTINE W REFLEX MICROSCOPIC
GLUCOSE, UA: NEGATIVE mg/dL
Nitrite: POSITIVE — AB
PROTEIN: 30 mg/dL — AB
SPECIFIC GRAVITY, URINE: 1.025 (ref 1.005–1.030)
pH: 6.5 (ref 5.0–8.0)

## 2015-09-07 MED ORDER — OXYCODONE HCL 5 MG PO TABS
10.0000 mg | ORAL_TABLET | Freq: Four times a day (QID) | ORAL | Status: DC | PRN
  Administered 2015-09-07 – 2015-09-08 (×2): 10 mg via ORAL
  Filled 2015-09-07 (×2): qty 2

## 2015-09-07 MED ORDER — POTASSIUM CHLORIDE 10 MEQ/100ML IV SOLN
10.0000 meq | Freq: Once | INTRAVENOUS | Status: AC
  Administered 2015-09-07: 10 meq via INTRAVENOUS
  Filled 2015-09-07: qty 100

## 2015-09-07 MED ORDER — FENTANYL 100 MCG/HR TD PT72
100.0000 ug | MEDICATED_PATCH | TRANSDERMAL | Status: DC
  Administered 2015-09-09: 100 ug via TRANSDERMAL
  Filled 2015-09-07 (×2): qty 1

## 2015-09-07 MED ORDER — ASPIRIN EC 81 MG PO TBEC
81.0000 mg | DELAYED_RELEASE_TABLET | Freq: Every day | ORAL | Status: DC
  Administered 2015-09-08 – 2015-09-12 (×5): 81 mg via ORAL
  Filled 2015-09-07 (×5): qty 1

## 2015-09-07 MED ORDER — POTASSIUM CHLORIDE 10 MEQ/50ML IV SOLN
10.0000 meq | INTRAVENOUS | Status: DC | PRN
  Filled 2015-09-07: qty 50

## 2015-09-07 MED ORDER — BISACODYL 10 MG RE SUPP: 10.0000 mg | Freq: Every day | RECTAL | Status: DC | PRN

## 2015-09-07 MED ORDER — ONDANSETRON HCL 4 MG/2ML IJ SOLN
4.0000 mg | Freq: Once | INTRAMUSCULAR | Status: AC
  Administered 2015-09-07: 4 mg via INTRAVENOUS
  Filled 2015-09-07: qty 2

## 2015-09-07 MED ORDER — ENOXAPARIN SODIUM 80 MG/0.8ML ~~LOC~~ SOLN
80.0000 mg | SUBCUTANEOUS | Status: DC
  Administered 2015-09-07 – 2015-09-11 (×4): 80 mg via SUBCUTANEOUS
  Filled 2015-09-07 (×4): qty 0.8

## 2015-09-07 MED ORDER — SODIUM CHLORIDE 0.9 % IV SOLN: INTRAVENOUS | Status: AC

## 2015-09-07 MED ORDER — DEXTROSE 5 % IV SOLN
1.0000 g | Freq: Once | INTRAVENOUS | Status: AC
  Administered 2015-09-07: 1 g via INTRAVENOUS
  Filled 2015-09-07: qty 10

## 2015-09-07 MED ORDER — ACETAMINOPHEN 500 MG PO TABS
1000.0000 mg | ORAL_TABLET | Freq: Three times a day (TID) | ORAL | Status: DC | PRN
Start: 2015-09-07 — End: 2015-09-12
  Administered 2015-09-11 – 2015-09-12 (×4): 1000 mg via ORAL
  Filled 2015-09-07 (×4): qty 2

## 2015-09-07 MED ORDER — ALPRAZOLAM 0.5 MG PO TABS
0.5000 mg | ORAL_TABLET | Freq: Four times a day (QID) | ORAL | Status: DC | PRN
  Administered 2015-09-07 – 2015-09-12 (×11): 0.5 mg via ORAL
  Filled 2015-09-07 (×11): qty 1

## 2015-09-07 MED ORDER — SERTRALINE HCL 50 MG PO TABS
100.0000 mg | ORAL_TABLET | Freq: Every day | ORAL | Status: DC
  Filled 2015-09-07 (×2): qty 2

## 2015-09-07 MED ORDER — PROMETHAZINE HCL 25 MG/ML IJ SOLN
12.5000 mg | Freq: Four times a day (QID) | INTRAMUSCULAR | Status: DC | PRN
  Administered 2015-09-07 – 2015-09-09 (×5): 12.5 mg via INTRAVENOUS
  Filled 2015-09-07 (×5): qty 1

## 2015-09-07 MED ORDER — SODIUM CHLORIDE 0.9 % IV BOLUS (SEPSIS)
1000.0000 mL | Freq: Once | INTRAVENOUS | Status: AC
  Administered 2015-09-07: 1000 mL via INTRAVENOUS

## 2015-09-07 MED ORDER — SODIUM CHLORIDE 0.9 % IV SOLN
INTRAVENOUS | Status: AC
  Administered 2015-09-08: 06:00:00 via INTRAVENOUS

## 2015-09-07 MED ORDER — FENTANYL 50 MCG/HR TD PT72
50.0000 ug | MEDICATED_PATCH | TRANSDERMAL | Status: DC
  Administered 2015-09-09: 50 ug via TRANSDERMAL
  Filled 2015-09-07: qty 1

## 2015-09-07 MED ORDER — BISACODYL 5 MG PO TBEC
10.0000 mg | DELAYED_RELEASE_TABLET | Freq: Every morning | ORAL | Status: DC
  Administered 2015-09-08 – 2015-09-12 (×5): 10 mg via ORAL
  Filled 2015-09-07 (×5): qty 2

## 2015-09-07 MED ORDER — MAGNESIUM SULFATE 2 GM/50ML IV SOLN
2.0000 g | INTRAVENOUS | Status: AC
  Administered 2015-09-07: 2 g via INTRAVENOUS
  Filled 2015-09-07: qty 50

## 2015-09-07 NOTE — ED Notes (Signed)
Report given to CiCely on Dept 300, all questions answered

## 2015-09-07 NOTE — ED Notes (Signed)
Kim NP from [facility] called, pt has chronic low potassium, complaining of nausea all the time and will not take meds, They will give IV at times.  Pt had bowel rest and enema, sent for CT, and EGD. They do not know what is causing her nausea and vomiting. Please call if she can help [phone]

## 2015-09-07 NOTE — ED Provider Notes (Signed)
[facility]-EMERGENCY DEPT Provider Note   CSN: [id number] Arrival date & time: 09/07/15  1629  First Provider Contact:  First MD Initiated Contact with Patient 09/07/15 1704        History   Chief Complaint Chief Complaint  Patient presents with  . Emesis    HPI [name] is a 60 y.o. female.  The patient is a 60 year old female, she has a prior history of what she describes as degenerative back disease which has caused her to essentially been nonambulatory and bedbound for the last year. She reports that she has not walked in over a year. She is morbidly obese. She has a PICC line in her right arm because of several months of persistent nausea vomiting and recurring dehydration. She lives at [facility] facility, over the last several days she has had persistent nausea vomiting and inability to tolerate oral fluids and as such she was sent for a CT scan on Friday which was unremarkable.  She is under the care of gastroenterology [facility], the patient reports that nobody knows why she is nauseated persistently.      Past Medical History:  Diagnosis Date  . Anxiety   . Depression   . DVT (deep venous thrombosis) (HCC)   . Facet syndrome, lumbar   . GERD (gastroesophageal reflux disease)   . Hypertension   . Lumbago   . 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 Obesity   . Primary localized osteoarthrosis, lower leg   . Thoracic or lumbosacral neuritis or radiculitis, unspecified    parapersis    Patient Active Problem List   Diagnosis Date Noted  . Hypokalemia 09/07/2015  . Anemia 05/28/2013  . GERD (gastroesophageal reflux disease) 05/27/2013  . Laryngopharyngeal reflux (LPR) 05/27/2013  . Constipation 02/19/2012  . Syncope and collapse 02/19/2012  . Chest pain 02/19/2012  . Knee osteoarthritis 05/03/2011  . Rotator cuff syndrome of right shoulder 05/03/2011  . Lumbar facet arthropathy 05/03/2011  . Morbid  obesity (HCC) 05/03/2011    Past Surgical History:  Procedure Laterality Date  . ABDOMINAL HYSTERECTOMY    . CESAREAN SECTION     x2  . CHOLECYSTECTOMY      OB History    No data available       Home Medications    Prior to Admission medications   Medication Sig Start Date End Date Taking? Authorizing Provider  acetaminophen (TYLENOL) 500 MG tablet Take 1,000 mg by mouth every 8 (eight) hours as needed.   Yes Historical Provider, MD  ALPRAZolam Prudy Feeler) 0.5 MG tablet Take 0.5 mg by mouth every 6 (six) hours as needed for anxiety. For anxiety.   Yes Historical Provider, MD  Alum Hydroxide-Mag Carbonate (GAVISCON EXTRA STRENGTH) 160-105 MG CHEW Chew 2 tablets by mouth 4 (four) times daily.   Yes Historical Provider, MD  aspirin EC 81 MG tablet Take 81 mg by mouth daily.   Yes Historical Provider, MD  bisacodyl (DULCOLAX) 5 MG EC tablet Take 10 mg by mouth every morning.   Yes Historical Provider, MD  calcium carbonate (TUMS - DOSED IN MG ELEMENTAL CALCIUM) 500 MG chewable tablet Chew 1 tablet by mouth 3 (three) times daily with meals.    Yes Historical Provider, MD  esomeprazole (NEXIUM) 40 MG capsule Take 40 mg by mouth 2 (two) times daily before a meal.   Yes Historical Provider, MD  fentaNYL (DURAGESIC - DOSED MCG/HR) 100 MCG/HR Place 100 mcg  onto the skin every 3 (three) days.   Yes Historical Provider, MD  fentaNYL (DURAGESIC - DOSED MCG/HR) 50 MCG/HR Place 50 mcg onto the skin every 3 (three) days.   Yes Historical Provider, MD  fentaNYL (DURAGESIC - DOSED MCG/HR) 50 MCG/HR Place 50 mcg onto the skin every 3 (three) days.   Yes Historical Provider, MD  losartan (COZAAR) 25 MG tablet Take 25 mg by mouth daily.    Yes Historical Provider, MD  magnesium oxide (MAG-OX) 400 MG tablet Take by mouth 2 (two) times daily.   Yes Historical Provider, MD  naloxegol oxalate (MOVANTIK) 25 MG TABS tablet Take 25 mg by mouth daily.   Yes Historical Provider, MD  nitroGLYCERIN (NITROSTAT) 0.4 MG  SL tablet Place 0.4 mg under the tongue every 5 (five) minutes as needed for chest pain.   Yes Historical Provider, MD  ondansetron (ZOFRAN-ODT) 8 MG disintegrating tablet Take 8 mg by mouth every 8 (eight) hours as needed for nausea or vomiting.   Yes Historical Provider, MD  Oxycodone HCl 10 MG TABS Take 10 mg by mouth every 6 (six) hours as needed (for pain).   Yes Historical Provider, MD  potassium chloride SA (K-DUR,KLOR-CON) 20 MEQ tablet Take 20 mEq by mouth 3 (three) times daily.    Yes Historical Provider, MD  promethazine (PHENERGAN) 25 MG suppository Place 25 mg rectally every 8 (eight) hours as needed for nausea or vomiting.   Yes Historical Provider, MD  promethazine (PHENERGAN) 25 MG tablet Take 25 mg by mouth every 8 (eight) hours as needed for nausea or vomiting.   Yes Historical Provider, MD  promethazine (PHENERGAN) 25 MG/ML injection Inject 25 mg into the vein every 6 (six) hours as needed for nausea or vomiting.   Yes Historical Provider, MD  ranitidine (ZANTAC) 75 MG tablet Take 75 mg by mouth 2 (two) times daily.   Yes Historical Provider, MD  saccharomyces boulardii (FLORASTOR) 250 MG capsule Take 250 mg by mouth daily.   Yes Historical Provider, MD  sertraline (ZOLOFT) 100 MG tablet Take 100 mg by mouth at bedtime.    Yes Historical Provider, MD  torsemide (DEMADEX) 20 MG tablet Take 80 mg by mouth daily.   Yes Historical Provider, MD  Vitamin D, Ergocalciferol, (DRISDOL) 50000 UNITS CAPS capsule Take 50,000 Units by mouth every 30 (thirty) days. Takes on the 20th of each month.   Yes Historical Provider, MD  WHEY PROTEIN PO Take by mouth daily.   Yes Historical Provider, MD    Family History Family History  Problem Relation Age of Onset  . Asthma Mother   . COPD Mother   . Heart disease Mother   . Heart disease Father   . Colon cancer Neg Hx     Social History Social History  Substance Use Topics  . Smoking status: Never Smoker  . Smokeless tobacco: Never Used    . Alcohol use No     Allergies   Sulfa antibiotics   Review of Systems Review of Systems  All other systems reviewed and are negative.    Physical Exam Updated Vital Signs BP 118/78   Pulse 91   Temp 97.8 F (36.6 C) (Oral)   Resp 17   Ht  (1.702 m)   Wt (!) 412 lb (186.9 kg)   SpO2 98%   BMI 64.53 kg/m   Physical Exam  Constitutional: She appears well-developed and well-nourished. No distress.  HENT:  Head: Normocephalic and atraumatic.  Mouth/Throat: No  oropharyngeal exudate.  Mucous membranes significantly dehydrated  Eyes: Conjunctivae and EOM are normal. Pupils are equal, round, and reactive to light. Right eye exhibits no discharge. Left eye exhibits no discharge. No scleral icterus.  Neck: Normal range of motion. Neck supple. No JVD present. No thyromegaly present.  Cardiovascular: Normal rate, regular rhythm, normal heart sounds and intact distal pulses.  Exam reveals no gallop and no friction rub.   No murmur heard. Pulmonary/Chest: Effort normal and breath sounds normal. No respiratory distress. She has no wheezes. She has no rales.  Abdominal: Soft. Bowel sounds are normal. She exhibits distension. She exhibits no mass. There is tenderness ( Tenderness in the epigastrium, and phonetic sounds to percussion diffusely).  Musculoskeletal: Normal range of motion. She exhibits edema. She exhibits no tenderness.  Lymphadenopathy:    She has no cervical adenopathy.  Neurological: She is alert. Coordination normal.  Skin: Skin is warm and dry. No rash noted. No erythema.  Psychiatric: She has a normal mood and affect. Her behavior is normal.  Nursing note and vitals reviewed.    ED Treatments / Results  Labs (all labs ordered are listed, but only abnormal results are displayed) Labs Reviewed  CBC WITH DIFFERENTIAL/PLATELET - Abnormal; Notable for the following:       Result Value   RBC 3.40 (*)    HCT 34.3 (*)    MCV 100.9 (*)    MCH 36.2 (*)    RDW  21.2 (*)    All other components within normal limits  COMPREHENSIVE METABOLIC PANEL - Abnormal; Notable for the following:    Sodium 131 (*)    Potassium 2.1 (*)    Chloride 89 (*)    CO2 34 (*)    Creatinine, Ser 0.43 (*)    Calcium 6.6 (*)    Total Protein 5.7 (*)    Albumin 2.0 (*)    AST 57 (*)    Total Bilirubin 1.5 (*)    All other components within normal limits  URINALYSIS, ROUTINE W REFLEX MICROSCOPIC (NOT AT [facility]) - Abnormal; Notable for the following:    Color, Urine AMBER (*)    APPearance CLOUDY (*)    Hgb urine dipstick LARGE (*)    Bilirubin Urine MODERATE (*)    Ketones, ur TRACE (*)    Protein, ur 30 (*)    Nitrite POSITIVE (*)    Leukocytes, UA MODERATE (*)    All other components within normal limits  URINE MICROSCOPIC-ADD ON - Abnormal; Notable for the following:    Squamous Epithelial / LPF 0-5 (*)    Bacteria, UA MANY (*)    All other components within normal limits  URINE CULTURE    EKG  EKG Interpretation  Date/Time:  Monday September 07 2015 20:14:41 EDT Ventricular Rate:  91 PR Interval:    QRS Duration: 100 QT Interval:  565 QTC Calculation: 696 R Axis:   46 Text Interpretation:  Atrial fibrillation Ventricular premature complex Low voltage, precordial leads Abnormal T, consider ischemia, anterior leads Prolonged QT interval Artifact in lead(s) I III aVL V2 QT prolonged  new from prior Confirmed by Elena Cothern  MD, Maziah Keeling ([id number]) on 09/07/2015 8:34:38 PM       Radiology No results found.  Procedures Procedures (including critical care time)  Medications Ordered in ED Medications  potassium chloride 10 mEq in 100 mL IVPB (10 mEq Intravenous New Bag/Given 09/07/15 2001)  cefTRIAXone (ROCEPHIN) 1 g in dextrose 5 % 50 mL IVPB (not administered)  ondansetron [facility]) injection 4 mg (4 mg Intravenous Given 09/07/15 1740)  sodium chloride 0.9 % bolus 1,000 mL (0 mLs Intravenous Stopped 09/07/15 1846)  magnesium sulfate IVPB 2 g 50 mL (0 g Intravenous  Stopped 09/07/15 2001)     Initial Impression / Assessment and Plan / ED Course  I have reviewed the triage vital signs and the nursing notes.  Pertinent labs & imaging results that were available during my care of the patient were reviewed by me and considered in my medical decision making (see chart for details).  Clinical Course     significantly morbidly obese, she has foul-appearing urine in her urinary catheter with no focal abdominal pain other than the epigastric area. At this time I would suggest that conservative therapy would be best as she does not appear to be a great surgical candidate and has had a negative CT scan 3 days ago. IV fluids, antiemetics, evaluation for possible urinary infection or end organ dysfunction has been started  Due to the prlonged QTc IV potassium and IV Magnesium were given to prevent arrhythmia - the pt required dmission to the hospital for sevre hypokalemia and persistent vomiting.  CRITICAL CARE Performed by: Vida Roller Total critical care time: 35 minutes Critical care time was exclusive of separately billable procedures and treating other patients. Critical care was necessary to treat or prevent imminent or life-threatening deterioration. Critical care was time spent personally by me on the following activities: development of treatment plan with patient and/or surrogate as well as nursing, discussions with consultants, evaluation of patient's response to treatment, examination of patient, obtaining history from patient or surrogate, ordering and performing treatments and interventions, ordering and review of laboratory studies, ordering and review of radiographic studies, pulse oximetry and re-evaluation of patient's condition.   Final Clinical Impressions(s) / ED Diagnoses   Final diagnoses:  UTI (lower urinary tract infection)  Intractable vomiting with nausea, vomiting of unspecified type  Dehydration  Hypokalemia  Prolonged QT  interval    New Prescriptions New Prescriptions   No medications on file     Eber Hong, MD 09/07/15 2037

## 2015-09-07 NOTE — H&P (Addendum)
TRH H&P   Patient Demographics:    [name], is a 60 y.o. female  MRN: [id number]  DOB - Jul 07, 1955  Admit Date - 09/07/2015  Outpatient Primary MD for the patient is Josue Hector, MD  Referring MD/NP/PA: Dr Hyacinth Meeker  [place] from: [place]  Chief Complaint  Patient presents with  . Emesis      HPI:    [name]  is a 60 y.o. female, *With a history of degenerative disc disease,  chronic bedbound status post 5 years, on chronic opiates for pain control has been having intractable nausea and vomiting for past 2 months. Apparently patient underwent all the workup including EGD at [facility], she also underwent CT scan abdomen pelvis on 08/27/15 which did not show any significant abnormality. Patient was supposed to Undergo colonoscopy but could not as she was unable to drink GoLYTELY to clean her colon. Patient has chronic constipation. She has been vomiting for the past 2 days and unable to tolerate oral fluids so she was sent to the ED for further evaluation. In the ED UA revealed positive nitrite, lab revealed severe hypokalemia with potassium 2.1. She admits to having chest pain, no shortness of breath. Positive dysuria   Review of systems:    In addition to the HPI above No Fever-chills, No Headache, No changes with Vision or hearing, No problems swallowing food or Liquids, No Chest pain, Cough or Shortness of Breath, No Blood in stool or Urine, + dysuria, No new skin rashes or bruises, No new joints pains-aches,  No new weakness, tingling, numbness in any extremity, No recent weight gain or loss, No polyuria, polydypsia or polyphagia, No significant Mental Stressors.  A full 10 point Review of Systems was done, except as stated above, all other Review of Systems were negative.   With Past History of the following :    Past Medical History:  Diagnosis  Date  . Anxiety   . Depression   . DVT (deep venous thrombosis) (HCC)   . Facet syndrome, lumbar   . GERD (gastroesophageal reflux disease)   . Hypertension   . Lumbago   . 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 Obesity   . Primary localized osteoarthrosis, lower leg   . Thoracic or lumbosacral neuritis or radiculitis, unspecified    parapersis      Past Surgical History:  Procedure Laterality Date  . ABDOMINAL HYSTERECTOMY    . CESAREAN SECTION     x2  . CHOLECYSTECTOMY        Social History:     Social History  Substance Use Topics  . Smoking status: Never Smoker  . Smokeless tobacco: Never Used  . Alcohol use No       Family History :     Family History  Problem Relation Age of Onset  . Asthma Mother   . COPD Mother   . Heart disease Mother   . Heart disease Father   . Colon  cancer Neg Hx       Home Medications:   Prior to Admission medications   Medication Sig Start Date End Date Taking? Authorizing Provider  acetaminophen (TYLENOL) 500 MG tablet Take 1,000 mg by mouth every 8 (eight) hours as needed.   Yes Historical Provider, MD  ALPRAZolam Prudy Feeler) 0.5 MG tablet Take 0.5 mg by mouth every 6 (six) hours as needed for anxiety. For anxiety.   Yes Historical Provider, MD  Alum Hydroxide-Mag Carbonate (GAVISCON EXTRA STRENGTH) 160-105 MG CHEW Chew 2 tablets by mouth 4 (four) times daily.   Yes Historical Provider, MD  aspirin EC 81 MG tablet Take 81 mg by mouth daily.   Yes Historical Provider, MD  bisacodyl (DULCOLAX) 5 MG EC tablet Take 10 mg by mouth every morning.   Yes Historical Provider, MD  calcium carbonate (TUMS - DOSED IN MG ELEMENTAL CALCIUM) 500 MG chewable tablet Chew 1 tablet by mouth 3 (three) times daily with meals.    Yes Historical Provider, MD  esomeprazole (NEXIUM) 40 MG capsule Take 40 mg by mouth 2 (two) times daily before a meal.   Yes Historical Provider, MD  fentaNYL (DURAGESIC - DOSED MCG/HR) 100 MCG/HR  Place 100 mcg onto the skin every 3 (three) days.   Yes Historical Provider, MD  fentaNYL (DURAGESIC - DOSED MCG/HR) 50 MCG/HR Place 50 mcg onto the skin every 3 (three) days.   Yes Historical Provider, MD  fentaNYL (DURAGESIC - DOSED MCG/HR) 50 MCG/HR Place 50 mcg onto the skin every 3 (three) days.   Yes Historical Provider, MD  losartan (COZAAR) 25 MG tablet Take 25 mg by mouth daily.    Yes Historical Provider, MD  magnesium oxide (MAG-OX) 400 MG tablet Take by mouth 2 (two) times daily.   Yes Historical Provider, MD  naloxegol oxalate (MOVANTIK) 25 MG TABS tablet Take 25 mg by mouth daily.   Yes Historical Provider, MD  nitroGLYCERIN (NITROSTAT) 0.4 MG SL tablet Place 0.4 mg under the tongue every 5 (five) minutes as needed for chest pain.   Yes Historical Provider, MD  ondansetron (ZOFRAN-ODT) 8 MG disintegrating tablet Take 8 mg by mouth every 8 (eight) hours as needed for nausea or vomiting.   Yes Historical Provider, MD  Oxycodone HCl 10 MG TABS Take 10 mg by mouth every 6 (six) hours as needed (for pain).   Yes Historical Provider, MD  potassium chloride SA (K-DUR,KLOR-CON) 20 MEQ tablet Take 20 mEq by mouth 3 (three) times daily.    Yes Historical Provider, MD  promethazine (PHENERGAN) 25 MG suppository Place 25 mg rectally every 8 (eight) hours as needed for nausea or vomiting.   Yes Historical Provider, MD  promethazine (PHENERGAN) 25 MG tablet Take 25 mg by mouth every 8 (eight) hours as needed for nausea or vomiting.   Yes Historical Provider, MD  promethazine (PHENERGAN) 25 MG/ML injection Inject 25 mg into the vein every 6 (six) hours as needed for nausea or vomiting.   Yes Historical Provider, MD  ranitidine (ZANTAC) 75 MG tablet Take 75 mg by mouth 2 (two) times daily.   Yes Historical Provider, MD  saccharomyces boulardii (FLORASTOR) 250 MG capsule Take 250 mg by mouth daily.   Yes Historical Provider, MD  sertraline (ZOLOFT) 100 MG tablet Take 100 mg by mouth at bedtime.    Yes  Historical Provider, MD  torsemide (DEMADEX) 20 MG tablet Take 80 mg by mouth daily.   Yes Historical Provider, MD  Vitamin D, Ergocalciferol, (DRISDOL) 50000  UNITS CAPS capsule Take 50,000 Units by mouth every 30 (thirty) days. Takes on the 20th of each month.   Yes Historical Provider, MD  WHEY PROTEIN PO Take by mouth daily.   Yes Historical Provider, MD     Allergies:     Allergies  Allergen Reactions  . Sulfa Antibiotics Itching and Swelling     Physical Exam:   Vitals  Blood pressure 118/78, pulse 91, temperature 97.8 F (36.6 C), temperature source Oral, resp. rate 17, height 5\' 7"  (1.702 m), weight (!) 186.9 kg (412 lb), SpO2 98 %.   1. General morbidly obese female  lying in bed in NAD, cooperative with exam  2. Normal affect and insight, Awake Alert, Oriented X 3.  3. No F.N deficits, ALL C.Nerves Intact, Strength 5/5 all 4 extremities, Sensation intact all 4 extremities, Plantars down going.  4. Ears and Eyes appear Normal, Conjunctivae clear, PERRLA. Moist Oral Mucosa.  5. Supple Neck, No JVD, No cervical lymphadenopathy appriciated, No Carotid Bruits.  6. Symmetrical Chest wall movement, Good air movement bilaterally, CTAB.  7. RRR, No Gallops, Rubs or Murmurs, No Parasternal Heave.No Leg edema  8. Positive Bowel Sounds, Abdomen Soft, mild tenderness to palpation, No organomegaly appriciated,No rebound -guarding or rigidity.  9.  No Cyanosis, Normal Skin Turgor, No Skin Rash or Bruise.  10. Good muscle tone,  joints appear normal , no effusions, Normal ROM.      Data Review:    CBC  Recent Labs Lab 09/07/15 1738  WBC 7.8  HGB 12.3  HCT 34.3*  PLT 289  MCV 100.9*  MCH 36.2*  MCHC 35.9  RDW 21.2*  LYMPHSABS 2.5  MONOABS 0.7  EOSABS 0.0  BASOSABS 0.0   ------------------------------------------------------------------------------------------------------------------  Chemistries   Recent Labs Lab 09/07/15 1738  NA 131*  K 2.1*  CL 89*   CO2 34*  GLUCOSE 96  BUN 8  CREATININE 0.43*  CALCIUM 6.6*  AST 57*  ALT 26  ALKPHOS 102  BILITOT 1.5*   ------------------------------------------------------------------------------------------------------------------  ------------------------------------------------------------------------------------------------------------------ GFR: Estimated Creatinine Clearance: 135.2 mL/min (by C-G formula based on SCr of 0.8 mg/dL). Liver Function Tests:  Recent Labs Lab 09/07/15 1738  AST 57*  ALT 26  ALKPHOS 102  BILITOT 1.5*  PROT 5.7*  ALBUMIN 2.0*    --------------------------------------------------------------------------------------------------------------- Urine analysis:    Component Value Date/Time   COLORURINE AMBER (A) 09/07/2015 1914   APPEARANCEUR CLOUDY (A) 09/07/2015 1914   LABSPEC 1.025 09/07/2015 1914   PHURINE 6.5 09/07/2015 1914   GLUCOSEU NEGATIVE 09/07/2015 1914   HGBUR LARGE (A) 09/07/2015 1914   BILIRUBINUR MODERATE (A) 09/07/2015 1914   KETONESUR TRACE (A) 09/07/2015 1914   PROTEINUR 30 (A) 09/07/2015 1914   UROBILINOGEN 1.0 02/19/2012 1838   NITRITE POSITIVE (A) 09/07/2015 1914   LEUKOCYTESUR MODERATE (A) 09/07/2015 1914      ----------------------------------------------------------------------------------------------------------------   Imaging Results:    No results found.  My personal review of EKG: Normal sinus rhythm   Assessment & Plan:    Active Problems:   Lumbar facet arthropathy   Morbid obesity (HCC)   Constipation   Hypokalemia   1. UTI- admit the patient, start ceftriaxone, obtain urine culture. Follow urine culture results 2. Hypokalemia- secondary to diuretics as well as intractable nausea vomiting, replace potassium and check BMP in a.m. Will hold diuretics at this time. 3. Chronic bedbound status- patient has been bedbound for possible ileus, she has history of degenerative arthropathy. Not a surgical candidate  as per patient's family.  4. Chronic pain syndrome- continue fentanyl 150 g every 72 hours, oxycodone 10 mg every 6 hours when necessary. 5. Intractable nausea vomiting- ?? Cause, patient underwent EGD and has been followed by GI at [facility]. No clear-cut cause elicited at this time. Patient is supposed to undergo colonoscopy as outpatient. Will continue with Phenergan 12.5 mg IV every 6 hours when necessary. 6. Abnormal EKG- EKG shows artifacts, but the computer reading possible A. Fib. Will repeat EKG at this time.   DVT Prophylaxis-   Lovenox   AM Labs Ordered, also please review Full Orders  Family Communication: Admission, patients condition and plan of care including tests being ordered have been discussed with the patient and her daughter at bedside who indicate understanding and agree with the plan and Code Status.  Code Status:  Full code  Admission status: Observation   Time spent in minutes : 60 min   Ridhaan Dreibelbis S M.D on 09/07/2015 at 8:26 PM  Between 7am to 7pm - Pager - [phone]. After 7pm go to [email or web address] - password [facility]  [facility] - Office  [phone]

## 2015-09-07 NOTE — ED Triage Notes (Signed)
Nausea, vomiting and abdominal pain times 4 months.  Seen for same and had CT scan last Friday.

## 2015-09-08 DIAGNOSIS — Z7401 Bed confinement status: Secondary | ICD-10-CM | POA: Diagnosis not present

## 2015-09-08 DIAGNOSIS — M1710 Unilateral primary osteoarthritis, unspecified knee: Secondary | ICD-10-CM | POA: Diagnosis present

## 2015-09-08 DIAGNOSIS — T502X5A Adverse effect of carbonic-anhydrase inhibitors, benzothiadiazides and other diuretics, initial encounter: Secondary | ICD-10-CM | POA: Diagnosis present

## 2015-09-08 DIAGNOSIS — B370 Candidal stomatitis: Secondary | ICD-10-CM | POA: Diagnosis present

## 2015-09-08 DIAGNOSIS — M47816 Spondylosis without myelopathy or radiculopathy, lumbar region: Secondary | ICD-10-CM | POA: Diagnosis not present

## 2015-09-08 DIAGNOSIS — Z7982 Long term (current) use of aspirin: Secondary | ICD-10-CM | POA: Diagnosis not present

## 2015-09-08 DIAGNOSIS — Z825 Family history of asthma and other chronic lower respiratory diseases: Secondary | ICD-10-CM | POA: Diagnosis not present

## 2015-09-08 DIAGNOSIS — N390 Urinary tract infection, site not specified: Secondary | ICD-10-CM | POA: Diagnosis present

## 2015-09-08 DIAGNOSIS — L8990 Pressure ulcer of unspecified site, unspecified stage: Secondary | ICD-10-CM | POA: Diagnosis not present

## 2015-09-08 DIAGNOSIS — F329 Major depressive disorder, single episode, unspecified: Secondary | ICD-10-CM | POA: Diagnosis present

## 2015-09-08 DIAGNOSIS — R532 Functional quadriplegia: Secondary | ICD-10-CM | POA: Diagnosis present

## 2015-09-08 DIAGNOSIS — I10 Essential (primary) hypertension: Secondary | ICD-10-CM | POA: Diagnosis present

## 2015-09-08 DIAGNOSIS — Z79891 Long term (current) use of opiate analgesic: Secondary | ICD-10-CM | POA: Diagnosis not present

## 2015-09-08 DIAGNOSIS — G894 Chronic pain syndrome: Secondary | ICD-10-CM | POA: Diagnosis present

## 2015-09-08 DIAGNOSIS — R1310 Dysphagia, unspecified: Secondary | ICD-10-CM | POA: Diagnosis present

## 2015-09-08 DIAGNOSIS — E860 Dehydration: Secondary | ICD-10-CM | POA: Diagnosis present

## 2015-09-08 DIAGNOSIS — E662 Morbid (severe) obesity with alveolar hypoventilation: Secondary | ICD-10-CM | POA: Diagnosis not present

## 2015-09-08 DIAGNOSIS — L89322 Pressure ulcer of left buttock, stage 2: Secondary | ICD-10-CM | POA: Diagnosis present

## 2015-09-08 DIAGNOSIS — K219 Gastro-esophageal reflux disease without esophagitis: Secondary | ICD-10-CM | POA: Diagnosis present

## 2015-09-08 DIAGNOSIS — Z6841 Body Mass Index (BMI) 40.0 and over, adult: Secondary | ICD-10-CM | POA: Diagnosis not present

## 2015-09-08 DIAGNOSIS — E876 Hypokalemia: Secondary | ICD-10-CM | POA: Diagnosis present

## 2015-09-08 DIAGNOSIS — D539 Nutritional anemia, unspecified: Secondary | ICD-10-CM | POA: Diagnosis present

## 2015-09-08 DIAGNOSIS — Z86718 Personal history of other venous thrombosis and embolism: Secondary | ICD-10-CM | POA: Diagnosis not present

## 2015-09-08 DIAGNOSIS — Z8249 Family history of ischemic heart disease and other diseases of the circulatory system: Secondary | ICD-10-CM | POA: Diagnosis not present

## 2015-09-08 DIAGNOSIS — G4733 Obstructive sleep apnea (adult) (pediatric): Secondary | ICD-10-CM | POA: Diagnosis not present

## 2015-09-08 DIAGNOSIS — K5909 Other constipation: Secondary | ICD-10-CM | POA: Diagnosis not present

## 2015-09-08 LAB — COMPREHENSIVE METABOLIC PANEL
ALK PHOS: 105 U/L (ref 38–126)
ALT: 29 U/L (ref 14–54)
ANION GAP: 9 (ref 5–15)
AST: 60 U/L — ABNORMAL HIGH (ref 15–41)
Albumin: 2 g/dL — ABNORMAL LOW (ref 3.5–5.0)
BILIRUBIN TOTAL: 2.1 mg/dL — AB (ref 0.3–1.2)
BUN: 8 mg/dL (ref 6–20)
CALCIUM: 6.5 mg/dL — AB (ref 8.9–10.3)
CO2: 30 mmol/L (ref 22–32)
CREATININE: 0.42 mg/dL — AB (ref 0.44–1.00)
Chloride: 92 mmol/L — ABNORMAL LOW (ref 101–111)
Glucose, Bld: 78 mg/dL (ref 65–99)
Potassium: 2.5 mmol/L — CL (ref 3.5–5.1)
Sodium: 131 mmol/L — ABNORMAL LOW (ref 135–145)
TOTAL PROTEIN: 5.8 g/dL — AB (ref 6.5–8.1)

## 2015-09-08 LAB — CBC
HCT: 34.4 % — ABNORMAL LOW (ref 36.0–46.0)
HEMOGLOBIN: 12 g/dL (ref 12.0–15.0)
MCH: 35.5 pg — AB (ref 26.0–34.0)
MCHC: 34.9 g/dL (ref 30.0–36.0)
MCV: 101.8 fL — ABNORMAL HIGH (ref 78.0–100.0)
PLATELETS: 307 10*3/uL (ref 150–400)
RBC: 3.38 MIL/uL — AB (ref 3.87–5.11)
RDW: 21.4 % — ABNORMAL HIGH (ref 11.5–15.5)
WBC: 16 10*3/uL — AB (ref 4.0–10.5)

## 2015-09-08 LAB — MAGNESIUM: MAGNESIUM: 1.6 mg/dL — AB (ref 1.7–2.4)

## 2015-09-08 LAB — MRSA PCR SCREENING

## 2015-09-08 MED ORDER — SODIUM CHLORIDE 0.9% FLUSH
10.0000 mL | INTRAVENOUS | Status: DC | PRN
Start: 2015-09-08 — End: 2015-09-12
  Administered 2015-09-11: 10 mL
  Filled 2015-09-08: qty 40

## 2015-09-08 MED ORDER — POTASSIUM CHLORIDE CRYS ER 20 MEQ PO TBCR
40.0000 meq | EXTENDED_RELEASE_TABLET | ORAL | Status: AC
  Administered 2015-09-08 (×2): 40 meq via ORAL
  Administered 2015-09-09: 20 meq via ORAL
  Filled 2015-09-08 (×3): qty 2

## 2015-09-08 MED ORDER — POTASSIUM CHLORIDE 10 MEQ/100ML IV SOLN
INTRAVENOUS | Status: AC
  Filled 2015-09-08: qty 100

## 2015-09-08 MED ORDER — OXYCODONE HCL 5 MG PO TABS
10.0000 mg | ORAL_TABLET | ORAL | Status: DC | PRN
  Administered 2015-09-08 – 2015-09-09 (×4): 10 mg via ORAL
  Filled 2015-09-08 (×5): qty 2

## 2015-09-08 MED ORDER — POTASSIUM CHLORIDE 10 MEQ/100ML IV SOLN
10.0000 meq | INTRAVENOUS | Status: AC
Start: 2015-09-08 — End: 2015-09-08
  Administered 2015-09-08 (×3): 10 meq via INTRAVENOUS

## 2015-09-08 MED ORDER — CHLORHEXIDINE GLUCONATE CLOTH 2 % EX PADS
6.0000 | MEDICATED_PAD | Freq: Every day | CUTANEOUS | Status: DC
  Administered 2015-09-08 – 2015-09-11 (×3): 6 via TOPICAL

## 2015-09-08 MED ORDER — MUPIROCIN 2 % EX OINT
1.0000 "application " | TOPICAL_OINTMENT | Freq: Two times a day (BID) | CUTANEOUS | Status: DC
  Administered 2015-09-08 – 2015-09-12 (×9): 1 via NASAL
  Filled 2015-09-08 (×3): qty 22

## 2015-09-08 MED ORDER — SODIUM CHLORIDE 0.9 % IV SOLN
INTRAVENOUS | Status: DC
  Administered 2015-09-09: via INTRAVENOUS

## 2015-09-08 MED ORDER — DEXTROSE 5 % IV SOLN
1.0000 g | INTRAVENOUS | Status: DC
  Administered 2015-09-08 – 2015-09-10 (×3): 1 g via INTRAVENOUS
  Filled 2015-09-08 (×6): qty 10

## 2015-09-08 MED ORDER — RANITIDINE HCL 150 MG/10ML PO SYRP
150.0000 mg | ORAL_SOLUTION | Freq: Every day | ORAL | Status: DC
  Filled 2015-09-08 (×3): qty 10

## 2015-09-08 MED ORDER — PANTOPRAZOLE SODIUM 40 MG PO TBEC
40.0000 mg | DELAYED_RELEASE_TABLET | Freq: Every morning | ORAL | Status: DC
  Administered 2015-09-09 – 2015-09-12 (×4): 40 mg via ORAL
  Filled 2015-09-08 (×4): qty 1

## 2015-09-08 MED ORDER — POTASSIUM CHLORIDE 10 MEQ/100ML IV SOLN
10.0000 meq | INTRAVENOUS | Status: AC
  Administered 2015-09-08 (×3): 10 meq via INTRAVENOUS
  Filled 2015-09-08 (×3): qty 100

## 2015-09-08 MED ORDER — ALUM & MAG HYDROXIDE-SIMETH 200-200-20 MG/5ML PO SUSP: 30.0000 mL | ORAL | Status: DC | PRN

## 2015-09-08 MED ORDER — SODIUM CHLORIDE 0.9% FLUSH
10.0000 mL | Freq: Two times a day (BID) | INTRAVENOUS | Status: DC
  Administered 2015-09-08 – 2015-09-09 (×2): 10 mL
  Administered 2015-09-10: 30 mL
  Administered 2015-09-11 (×2): 10 mL

## 2015-09-08 NOTE — Progress Notes (Signed)
PROGRESS NOTE    [name]  STM:[id number] DOB: [birth year] DOA: 09/07/2015 PCP: Josue Hector, MD     Brief Narrative:  60 year old woman admitted on 7/31 from SNF with complaints of vomiting. She was found to have a UTI and hypokalemia and was admitted for further evaluation and management.   Assessment & Plan:   Active Problems:   Lumbar facet arthropathy   Morbid obesity (HCC)   Constipation   Hypokalemia   Hypokalemia -Continue to replete orally, recheck levels in a.m.  UTI -Continue Rocephin pending culture data X  Emesis -Likely due to UTI, advance diet as tolerated.  Morbid obesity -Noted   DVT prophylaxis: Lovenox Code Status: Full code Family Communication: Patient only Disposition Plan: Back to SNF in 24-48 hours  Consultants:   None  Procedures:   None  Antimicrobials:   Rocephin    Subjective: Still a little nauseous, however improved, complains of back pain which is chronic.  Objective: Vitals:   09/07/15 2155 09/07/15 2203 09/08/15 0500 09/08/15 1445  BP: 99/71 99/71 101/73 98/82  Pulse: (!) 112 (!) 112 (!) 108 96  Resp: 16  16 18   Temp: 99.6 F (37.6 C) 99.6 F (37.6 C) 99.7 F (37.6 C) 98.7 F (37.1 C)  TempSrc: Oral Oral Oral Oral  SpO2: 100% 100% 99% 100%  Weight: (!) 152.6 kg (336 lb 6.8 oz) (!) 152.6 kg (336 lb 6.8 oz)    Height: 5\' 7"  (1.702 m) 5\' 7"  (1.702 m)      Intake/Output Summary (Last 24 hours) at 09/08/15 1733 Last data filed at 09/08/15 0800  Gross per 24 hour  Intake              450 ml  Output                0 ml  Net              450 ml   Filed Weights   09/07/15 1633 09/07/15 2155 09/07/15 2203  Weight: (!) 186.9 kg (412 lb) (!) 152.6 kg (336 lb 6.8 oz) (!) 152.6 kg (336 lb 6.8 oz)    Examination:  General exam: Alert, awake, oriented x 3, Morbidly obese Respiratory system: Clear to auscultation. Respiratory effort normal. Cardiovascular system:RRR. No murmurs, rubs,  gallops. Gastrointestinal system: Abdomen is nondistended, soft and nontender. No organomegaly or masses felt. Normal bowel sounds heard. Central nervous system: Alert and oriented. No focal neurological deficits. Extremities: No C/C/E, +pedal pulses Skin: No rashes, lesions or ulcers Psychiatry: Judgement and insight appear normal. Mood & affect appropriate.     Data Reviewed: I have personally reviewed following labs and imaging studies  CBC:  Recent Labs Lab 09/07/15 1738 09/08/15 0438  WBC 7.8 16.0*  NEUTROABS 4.5  --   HGB 12.3 12.0  HCT 34.3* 34.4*  MCV 100.9* 101.8*  PLT 289 307   Basic Metabolic Panel:  Recent Labs Lab 09/07/15 1738 09/08/15 0438  NA 131* 131*  K 2.1* 2.5*  CL 89* 92*  CO2 34* 30  GLUCOSE 96 78  BUN 8 8  CREATININE 0.43* 0.42*  CALCIUM 6.6* 6.5*  MG  --  1.6*   GFR: Estimated Creatinine Clearance: 118.6 mL/min (by C-G formula based on SCr of 0.8 mg/dL). Liver Function Tests:  Recent Labs Lab 09/07/15 1738 09/08/15 0438  AST 57* 60*  ALT 26 29  ALKPHOS 102 105  BILITOT 1.5* 2.1*  PROT 5.7* 5.8*  ALBUMIN 2.0* 2.0*   No  results for input(s): LIPASE, AMYLASE in the last 168 hours. No results for input(s): AMMONIA in the last 168 hours. Coagulation Profile: No results for input(s): INR, PROTIME in the last 168 hours. Cardiac Enzymes: No results for input(s): CKTOTAL, CKMB, CKMBINDEX, TROPONINI in the last 168 hours. BNP (last 3 results) No results for input(s): PROBNP in the last 8760 hours. HbA1C: No results for input(s): HGBA1C in the last 72 hours. CBG: No results for input(s): GLUCAP in the last 168 hours. Lipid Profile: No results for input(s): CHOL, HDL, LDLCALC, TRIG, CHOLHDL, LDLDIRECT in the last 72 hours. Thyroid Function Tests: No results for input(s): TSH, T4TOTAL, FREET4, T3FREE, THYROIDAB in the last 72 hours. Anemia Panel: No results for input(s): VITAMINB12, FOLATE, FERRITIN, TIBC, IRON, RETICCTPCT in the last  72 hours. Urine analysis:    Component Value Date/Time   COLORURINE AMBER (A) 09/07/2015 1914   APPEARANCEUR CLOUDY (A) 09/07/2015 1914   LABSPEC 1.025 09/07/2015 1914   PHURINE 6.5 09/07/2015 1914   GLUCOSEU NEGATIVE 09/07/2015 1914   HGBUR LARGE (A) 09/07/2015 1914   BILIRUBINUR MODERATE (A) 09/07/2015 1914   KETONESUR TRACE (A) 09/07/2015 1914   PROTEINUR 30 (A) 09/07/2015 1914   UROBILINOGEN 1.0 02/19/2012 1838   NITRITE POSITIVE (A) 09/07/2015 1914   LEUKOCYTESUR MODERATE (A) 09/07/2015 1914   Sepsis Labs: (procalcitonin:4,lacticidven:4)  ) Recent Results (from the past 240 hour(s))  MRSA PCR Screening     Status: Abnormal   Collection Time: 09/07/15 10:15 PM  Result Value Ref Range Status   MRSA by PCR RESULT CALLED TO, READ BACK BY AND VERIFIED WITH: (A) NEGATIVE Final    Comment: COVINGTON,L AT 0815 BY HUFFINES,S ON 09/08/15.        The GeneXpert MRSA Assay (FDA approved for NASAL specimens only), is one component of a comprehensive MRSA colonization surveillance program. It is not intended to diagnose MRSA infection nor to guide or monitor treatment for MRSA infections.          Radiology Studies: No results found.      Scheduled Meds: . sodium chloride   Intravenous STAT  . aspirin EC  81 mg Oral Daily  . bisacodyl  10 mg Oral q morning - 10a  . cefTRIAXone (ROCEPHIN)  IV  1 g Intravenous Q24H  . Chlorhexidine Gluconate Cloth  6 each Topical Q0600  . enoxaparin (LOVENOX) injection  80 mg Subcutaneous Q24H  . [START ON 09/09/2015] fentaNYL  100 mcg Transdermal Q72H  . [START ON 09/09/2015] fentaNYL  50 mcg Transdermal Q72H  . mupirocin ointment  1 application Nasal BID  . potassium chloride  40 mEq Oral Q4H  . sertraline  100 mg Oral QHS   Continuous Infusions:    LOS: 0 days    Time spent: 25 minutes. Greater than 50% of this time was spent in direct contact with the patient coordinating care.     Chaya Jan,  MD [facility] Pager [phone]  If 7PM-7AM, please contact night-coverage [email or web address] Password [facility] 09/08/2015, 5:33 PM

## 2015-09-08 NOTE — Progress Notes (Signed)
Patient's potassium level 2.5 this am, MD informed and orders given.

## 2015-09-08 NOTE — Progress Notes (Signed)
Pharmacy Antibiotic Note  [name] is a 60 y.o. female admitted on 09/07/2015 with UTI.  Pharmacy has been consulted for ROCEPHIN dosing.  Plan: Rocephin 1gm IV q24hrs Monitor labs, progress, c/s  Height: 5\' 7"  (170.2 cm) Weight: (!) 336 lb 6.8 oz (152.6 kg) IBW/kg (Calculated) : 61.6  Temp (24hrs), Avg:99.2 F (37.3 C), Min:97.8 F (36.6 C), Max:99.7 F (37.6 C)   Recent Labs Lab 09/07/15 1738 09/08/15 0438  WBC 7.8 16.0*  CREATININE 0.43* 0.42*    Estimated Creatinine Clearance: 118.6 mL/min (by C-G formula based on SCr of 0.8 mg/dL).    Allergies  Allergen Reactions  . Sulfa Antibiotics Itching and Swelling   Anti-infectives    Start     Dose/Rate Route Frequency Ordered Stop   09/08/15 2100  cefTRIAXone (ROCEPHIN) 1 g in dextrose 5 % 50 mL IVPB     1 g 100 mL/hr over 30 Minutes Intravenous Every 24 hours 09/08/15 0739     09/07/15 2015  cefTRIAXone (ROCEPHIN) 1 g in dextrose 5 % 50 mL IVPB     1 g 100 mL/hr over 30 Minutes Intravenous  Once 09/07/15 2007 09/07/15 2131     Recent Results (from the past 240 hour(s))  MRSA PCR Screening     Status: Abnormal   Collection Time: 09/07/15 10:15 PM  Result Value Ref Range Status   MRSA by PCR RESULT CALLED TO, READ BACK BY AND VERIFIED WITH: (A) NEGATIVE Final    Comment: COVINGTON,L AT 0815 BY HUFFINES,S ON 09/08/15.        The GeneXpert MRSA Assay (FDA approved for NASAL specimens only), is one component of a comprehensive MRSA colonization surveillance program. It is not intended to diagnose MRSA infection nor to guide or monitor treatment for MRSA infections.    Thank you for allowing pharmacy to be a part of this patient's care.  Valrie Hart A 09/08/2015 10:21 AM

## 2015-09-08 NOTE — Care Management Note (Signed)
Case Management Note  Patient Details  Name: [name] MRN: [id number] Date of Birth: 24-Jun-1955  Subjective/Objective: Patient adm from [facility] with hypokalemia and UTI. Plans to return to SNF at discharge when appropriate.                 Action/Plan: DC back to SNF when appropriate. CSW aware and will make arrangments for return.    Expected Discharge Date:  09/10/15               Expected Discharge Plan:  Skilled Nursing Facility  In-House Referral:  Clinical Social Work  Discharge planning Services  CM Consult  Post Acute Care Choice:  NA Choice offered to:  NA  DME Arranged:    DME Agency:     HH Arranged:    HH Agency:     Status of Service:  Completed, signed off  If discussed at [employer], dates discussed:    Additional Comments:  [name], Chrystine Oiler, RN 09/08/2015, 10:43 AM

## 2015-09-08 NOTE — Clinical Social Work Note (Signed)
Clinical Social Work Assessment  Patient Details  Name: [name] MRN: [id number] Date of Birth: [birth year]  Date of referral:  09/08/15               Reason for consult:  Discharge Planning                Permission sought to share information with:  Chartered certified accountant granted to share information::  Yes, Verbal Permission Granted  Name::     [name], Runner, broadcasting/film/video::  [employer]  Relationship::  sister, daughter  Sport and exercise psychologist Information:     Housing/Transportation Living arrangements for the past 2 months:  [place] of Information:  Patient, Facility Patient Interpreter Needed:  None Criminal Activity/Legal Involvement Pertinent to Current Situation/Hospitalization:  No - Comment as needed Significant Relationships:  Adult Children, Siblings Lives with:  Facility Resident Do you feel safe going back to the place where you live?  Yes Need for family participation in patient care:  No (Coment)  Care giving concerns:  None reported. Pt is long term resident at [facility].    Social Worker assessment / plan:  CSW met with pt and pt's sister, [name] at bedside with pt permission. Pt alert and oriented and reports that she has been a resident at [facility] for about 3 years. Her daughter and sister visit often and are very involved. Pt has been on IV fluids on and off at facility for the past month. Per Claiborne Billings at facility, pt is nursing level of care and okay to return. She is bed bound as she chooses not to get out of bed with a lift.   Employment status:  Disabled (Comment on whether or not currently receiving Disability) Insurance information:  Medicaid In [place] PT Recommendations:  Not assessed at this time Information / Referral to community resources:  Other (Comment Required) (Return to [facility])  Patient/Family's Response to care:  Pt is agreeable to return to [facility] when medically stable.   Patient/Family's Understanding  of and Emotional Response to Diagnosis, Current Treatment, and Prognosis:  Pt is aware of admission diagnosis and is wanting to speak to MD regarding plan of care. They are very concerned about pt's intractable nausea and vomiting for the past several months.   Emotional Assessment Appearance:  Appears stated age Attitude/Demeanor/Rapport:  Other (Cooperative) Affect (typically observed):  Appropriate Orientation:  Oriented to Self, Oriented to Place, Oriented to  Time, Oriented to Situation Alcohol / Substance use:  Not Applicable Psych involvement (Current and /or in the community):  No (Comment)  Discharge Needs  Concerns to be addressed:  Discharge Planning Concerns Readmission within the last 30 days:  No Current discharge risk:  None Barriers to Discharge:  Continued Medical Work up   [employer], [employer], [place] 09/08/2015, 10:24 AM [phone]

## 2015-09-08 NOTE — NC FL2 (Signed)
[place] MEDICAID FL2 LEVEL OF CARE SCREENING TOOL     IDENTIFICATION  Patient Name: [name] Birthdate: 1955/12/05 Sex: female Admission Date (Current Location): 09/07/2015  [place] and [place] Number:  Aaron Edelman [id number] R Facility and Address:  [name],  [address] [place] [address]      Provider Number: [id number]  Attending Physician Name and Address:  Micael Hampshire Acost*  Relative Name and Phone Number:       Current Level of Care: Hospital Recommended Level of Care: Skilled Nursing Facility Prior Approval Number:    Date Approved/Denied:   PASRR Number: [id number]  Discharge Plan: SNF    Current Diagnoses: Patient Active Problem List   Diagnosis Date Noted  . Hypokalemia 09/07/2015  . Anemia 05/28/2013  . GERD (gastroesophageal reflux disease) 05/27/2013  . Laryngopharyngeal reflux (LPR) 05/27/2013  . Constipation 02/19/2012  . Syncope and collapse 02/19/2012  . Chest pain 02/19/2012  . Knee osteoarthritis 05/03/2011  . Rotator cuff syndrome of right shoulder 05/03/2011  . Lumbar facet arthropathy 05/03/2011  . Morbid obesity (HCC) 05/03/2011    Orientation RESPIRATION BLADDER Height & Weight     Self, Time, Situation, Place  Normal Indwelling catheter Weight: (!) 336 lb 6.8 oz (152.6 kg) Height:   (170.2 cm)  BEHAVIORAL SYMPTOMS/MOOD NEUROLOGICAL BOWEL NUTRITION STATUS  Other (Comment) (n/a)  (n/a) Incontinent Diet (Clear liquid diet. See d/c summary for updates.)  AMBULATORY STATUS COMMUNICATION OF NEEDS Skin   Total Care Verbally Other (Comment) (excoriated neck, thigh, groin. Stage II left buttock with foam dressing. Diabetic ulcer right thigh with foam dressing. )                       Personal Care Assistance Level of Assistance  Bathing, Feeding, Dressing Bathing Assistance: Maximum assistance Feeding assistance: Limited assistance Dressing Assistance: Maximum assistance     Functional  Limitations Info  Sight, Hearing, Speech Sight Info: Adequate Hearing Info: Adequate Speech Info: Adequate    SPECIAL CARE FACTORS FREQUENCY                       Contractures      Additional Factors Info  Psychotropic Code Status Info: Full code Allergies Info: Sulfa antibiotics Psychotropic Info: Xanax   Isolation Precautions Info: 06/14/10 MRSA PCR +. Contact precautions     Current Medications (09/08/2015):  This is the current hospital active medication list Current Facility-Administered Medications  Medication Dose Route Frequency Provider Last Rate Last Dose  . 0.9 %  sodium chloride infusion   Intravenous STAT Eber Hong, MD      . 0.9 %  sodium chloride infusion   Intravenous Continuous Meredeth Ide, MD 75 mL/hr at 09/08/15 0605    . acetaminophen (TYLENOL) tablet 1,000 mg  1,000 mg Oral Q8H PRN Meredeth Ide, MD      . ALPRAZolam Prudy Feeler) tablet 0.5 mg  0.5 mg Oral Q6H PRN Meredeth Ide, MD   0.5 mg at 09/07/15 2244  . aspirin EC tablet 81 mg  81 mg Oral Daily Meredeth Ide, MD   81 mg at 09/08/15 2841  . bisacodyl (DULCOLAX) EC tablet 10 mg  10 mg Oral q morning - 10a Meredeth Ide, MD   10 mg at 09/08/15 3244  . bisacodyl (DULCOLAX) suppository 10 mg  10 mg Rectal Daily PRN Meredeth Ide, MD      . cefTRIAXone (ROCEPHIN) 1 g in  dextrose 5 % 50 mL IVPB  1 g Intravenous Q24H Henderson Cloud, MD      . Chlorhexidine Gluconate Cloth 2 % PADS 6 each  6 each Topical Q0600 Henderson Cloud, MD      . enoxaparin (LOVENOX) injection 80 mg  80 mg Subcutaneous Q24H Meredeth Ide, MD   80 mg at 09/07/15 2229  . [START ON 09/09/2015] fentaNYL (DURAGESIC - dosed mcg/hr) 100 mcg  100 mcg Transdermal Q72H Meredeth Ide, MD      . Melene Muller ON 09/09/2015] fentaNYL (DURAGESIC - dosed mcg/hr) 50 mcg  50 mcg Transdermal Q72H Meredeth Ide, MD      . mupirocin ointment (BACTROBAN) 2 % 1 application  1 application Nasal BID Henderson Cloud, MD      . oxyCODONE (Oxy  IR/ROXICODONE) immediate release tablet 10 mg  10 mg Oral Q6H PRN Meredeth Ide, MD   10 mg at 09/08/15 7121  . promethazine (PHENERGAN) injection 12.5 mg  12.5 mg Intravenous Q6H PRN Meredeth Ide, MD   12.5 mg at 09/08/15 0604  . sertraline (ZOLOFT) tablet 100 mg  100 mg Oral QHS Meredeth Ide, MD         Discharge Medications: Please see discharge summary for a list of discharge medications.  Relevant Imaging Results:  Relevant Lab Results:   Additional Information    Karn Cassis, [place] [phone]

## 2015-09-08 NOTE — Progress Notes (Signed)
MEDICATION RELATED CONSULT NOTE - INITIAL   Pharmacy Consult for lovenox Indication: vte prophylaxis  Allergies  Allergen Reactions  . Sulfa Antibiotics Itching and Swelling    Patient Measurements: Height: 5\' 7"  (170.2 cm) Weight: (!) 336 lb 6.8 oz (152.6 kg) IBW/kg (Calculated) : 61.6   Vital Signs: Temp: 98.7 F (37.1 C) (08/01 1445) Temp Source: Oral (08/01 1445) BP: 98/82 (08/01 1445) Pulse Rate: 96 (08/01 1445) Intake/Output from previous day: 07/31 0701 - 08/01 0700 In: 450 [I.V.:450] Out: -  Intake/Output from this shift: Total I/O In: 120 [P.O.:120] Out: 150 [Urine:150]  Labs:  Recent Labs  09/07/15 1738 09/08/15 0438  WBC 7.8 16.0*  HGB 12.3 12.0  HCT 34.3* 34.4*  PLT 289 307  CREATININE 0.43* 0.42*  MG  --  1.6*  ALBUMIN 2.0* 2.0*  PROT 5.7* 5.8*  AST 57* 60*  ALT 26 29  ALKPHOS 102 105  BILITOT 1.5* 2.1*   Estimated Creatinine Clearance: 118.6 mL/min (by C-G formula based on SCr of 0.8 mg/dL).   Microbiology: Recent Results (from the past 720 hour(s))  MRSA PCR Screening     Status: Abnormal   Collection Time: 09/07/15 10:15 PM  Result Value Ref Range Status   MRSA by PCR RESULT CALLED TO, READ BACK BY AND VERIFIED WITH: (A) NEGATIVE Final    Comment: COVINGTON,L AT 0815 BY HUFFINES,S ON 09/08/15.        The GeneXpert MRSA Assay (FDA approved for NASAL specimens only), is one component of a comprehensive MRSA colonization surveillance program. It is not intended to diagnose MRSA infection nor to guide or monitor treatment for MRSA infections.     Medical History: Past Medical History:  Diagnosis Date  . Anxiety   . Depression   . DVT (deep venous thrombosis) (HCC)   . Facet syndrome, lumbar   . GERD (gastroesophageal reflux disease)   . Hypertension   . Lumbago   . 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 Obesity   . Primary localized osteoarthrosis, lower leg   . Thoracic or lumbosacral  neuritis or radiculitis, unspecified    parapersis    Medications:  Prescriptions Prior to Admission  Medication Sig Dispense Refill Last Dose  . acetaminophen (TYLENOL) 500 MG tablet Take 1,000 mg by mouth every 8 (eight) hours as needed.   Past Month at Unknown time  . ALPRAZolam (XANAX) 0.5 MG tablet Take 0.5 mg by mouth every 6 (six) hours as needed for anxiety. For anxiety.   09/05/2015 at Unknown time  . Alum Hydroxide-Mag Carbonate (GAVISCON EXTRA STRENGTH) 160-105 MG CHEW Chew 2 tablets by mouth 4 (four) times daily.   09/04/2015 at Unknown time  . aspirin EC 81 MG tablet Take 81 mg by mouth daily.   09/07/2015 at Unknown time  . bisacodyl (DULCOLAX) 5 MG EC tablet Take 10 mg by mouth every morning.   09/07/2015 at Unknown time  . calcium carbonate (TUMS - DOSED IN MG ELEMENTAL CALCIUM) 500 MG chewable tablet Chew 1 tablet by mouth 3 (three) times daily with meals.    09/07/2015 at Unknown time  . esomeprazole (NEXIUM) 40 MG capsule Take 40 mg by mouth 2 (two) times daily before a meal.   09/07/2015 at Unknown time  . fentaNYL (DURAGESIC - DOSED MCG/HR) 100 MCG/HR Place 100 mcg onto the skin every 3 (three) days.   09/07/2015 at Unknown time  . fentaNYL (DURAGESIC - DOSED MCG/HR) 50 MCG/HR Place 50 mcg  onto the skin every 3 (three) days.   09/03/2015 at Unknown time  . fentaNYL (DURAGESIC - DOSED MCG/HR) 50 MCG/HR Place 50 mcg onto the skin every 3 (three) days.   Past Week at Unknown time  . losartan (COZAAR) 25 MG tablet Take 25 mg by mouth daily.    09/07/2015 at Unknown time  . magnesium oxide (MAG-OX) 400 MG tablet Take by mouth 2 (two) times daily.   09/07/2015 at Unknown time  . naloxegol oxalate (MOVANTIK) 25 MG TABS tablet Take 25 mg by mouth daily.   09/07/2015 at Unknown time  . nitroGLYCERIN (NITROSTAT) 0.4 MG SL tablet Place 0.4 mg under the tongue every 5 (five) minutes as needed for chest pain.   unknown  . ondansetron (ZOFRAN-ODT) 8 MG disintegrating tablet Take 8 mg by mouth every  8 (eight) hours as needed for nausea or vomiting.   08/31/2015 at Unknown time  . Oxycodone HCl 10 MG TABS Take 10 mg by mouth every 6 (six) hours as needed (for pain).   09/07/2015 at Unknown time  . potassium chloride SA (K-DUR,KLOR-CON) 20 MEQ tablet Take 20 mEq by mouth 3 (three) times daily.    09/07/2015 at Unknown time  . promethazine (PHENERGAN) 25 MG suppository Place 25 mg rectally every 8 (eight) hours as needed for nausea or vomiting.   unknown  . promethazine (PHENERGAN) 25 MG tablet Take 25 mg by mouth every 8 (eight) hours as needed for nausea or vomiting.   09/04/2015 at Unknown time  . promethazine (PHENERGAN) 25 MG/ML injection Inject 25 mg into the vein every 6 (six) hours as needed for nausea or vomiting.   09/07/2015 at Unknown time  . ranitidine (ZANTAC) 75 MG tablet Take 75 mg by mouth 2 (two) times daily.   09/07/2015 at Unknown time  . saccharomyces boulardii (FLORASTOR) 250 MG capsule Take 250 mg by mouth daily.   09/07/2015 at Unknown time  . sertraline (ZOLOFT) 100 MG tablet Take 100 mg by mouth at bedtime.    09/05/2015 at Unknown time  . torsemide (DEMADEX) 20 MG tablet Take 80 mg by mouth daily.   09/07/2015 at Unknown time  . Vitamin D, Ergocalciferol, (DRISDOL) 50000 UNITS CAPS capsule Take 50,000 Units by mouth every 30 (thirty) days. Takes on the 20th of each month.   08/27/2015 at Unknown time  . WHEY PROTEIN PO Take by mouth daily.   09/07/2015 at Unknown time    Assessment: 60 yo obese female to receive lovenox for VTE prophylaxis  Goal of Therapy:  Prevention of VTE  Plan:  Lovenox 80 mg sq q24 hours  Talbert Cage Poteet 09/08/2015,6:02 PM

## 2015-09-09 DIAGNOSIS — E662 Morbid (severe) obesity with alveolar hypoventilation: Secondary | ICD-10-CM

## 2015-09-09 DIAGNOSIS — E876 Hypokalemia: Principal | ICD-10-CM

## 2015-09-09 DIAGNOSIS — M47816 Spondylosis without myelopathy or radiculopathy, lumbar region: Secondary | ICD-10-CM

## 2015-09-09 DIAGNOSIS — G4733 Obstructive sleep apnea (adult) (pediatric): Secondary | ICD-10-CM | POA: Diagnosis present

## 2015-09-09 DIAGNOSIS — K5909 Other constipation: Secondary | ICD-10-CM

## 2015-09-09 LAB — COMPREHENSIVE METABOLIC PANEL
ALK PHOS: 104 U/L (ref 38–126)
ALT: 28 U/L (ref 14–54)
ANION GAP: 8 (ref 5–15)
AST: 69 U/L — ABNORMAL HIGH (ref 15–41)
Albumin: 1.8 g/dL — ABNORMAL LOW (ref 3.5–5.0)
BILIRUBIN TOTAL: 1.6 mg/dL — AB (ref 0.3–1.2)
BUN: 8 mg/dL (ref 6–20)
CALCIUM: 6.7 mg/dL — AB (ref 8.9–10.3)
CO2: 31 mmol/L (ref 22–32)
CREATININE: 0.41 mg/dL — AB (ref 0.44–1.00)
Chloride: 95 mmol/L — ABNORMAL LOW (ref 101–111)
GFR calc non Af Amer: >60 mL/min (ref >60)
GLUCOSE: 81 mg/dL (ref 65–99)
Potassium: 3 mmol/L — ABNORMAL LOW (ref 3.5–5.1)
SODIUM: 134 mmol/L — AB (ref 135–145)
TOTAL PROTEIN: 5.4 g/dL — AB (ref 6.5–8.1)

## 2015-09-09 LAB — URINE CULTURE

## 2015-09-09 LAB — TSH: TSH: 1.989 u[IU]/mL (ref 0.350–4.500)

## 2015-09-09 MED ORDER — POTASSIUM CHLORIDE CRYS ER 20 MEQ PO TBCR
40.0000 meq | EXTENDED_RELEASE_TABLET | Freq: Every day | ORAL | Status: DC
  Administered 2015-09-10 – 2015-09-12 (×3): 40 meq via ORAL
  Filled 2015-09-09 (×3): qty 2

## 2015-09-09 MED ORDER — POTASSIUM CHLORIDE 10 MEQ/100ML IV SOLN
10.0000 meq | INTRAVENOUS | Status: AC
  Administered 2015-09-09 (×2): 10 meq via INTRAVENOUS
  Filled 2015-09-09: qty 100

## 2015-09-09 MED ORDER — KCL IN DEXTROSE-NACL 20-5-0.9 MEQ/L-%-% IV SOLN
INTRAVENOUS | Status: DC
  Administered 2015-09-09 – 2015-09-10 (×2): via INTRAVENOUS

## 2015-09-09 MED ORDER — ALUM & MAG HYDROXIDE-SIMETH 200-200-20 MG/5ML PO SUSP
30.0000 mL | ORAL | Status: DC | PRN
  Administered 2015-09-09: 30 mL via ORAL
  Filled 2015-09-09: qty 30

## 2015-09-09 MED ORDER — MAGNESIUM SULFATE 2 GM/50ML IV SOLN
2.0000 g | Freq: Once | INTRAVENOUS | Status: AC
  Administered 2015-09-09: 2 g via INTRAVENOUS
  Filled 2015-09-09: qty 50

## 2015-09-09 MED ORDER — RANITIDINE HCL 150 MG/10ML PO SYRP
ORAL_SOLUTION | ORAL | Status: AC
  Filled 2015-09-09: qty 10

## 2015-09-09 NOTE — Progress Notes (Signed)
[facility] PROGRESS NOTE  [name] NID:[id number] DOB: Oct 17, 1955    PCP:   Josue Hector, MD   HPI: [name] is an 60 y.o. female with hx of depression, morbid obesity, chronic nausea for unclear reason, GERD, cervical spinal stenosis, functional quadriplegia, admitted with persistent hypokalemia, hypomagnesemia, and unable to take oral supplements.  She has no abdominal pain, fever, or chills.   She has been on SSRI without good effects, and admitted this time for persistent electrolyte abnormality, and UTI.  She was started on IV Rocephin and supplementation of her K.  She has a PICC line.  She also has chronic foley.    Rewiew of Systems:  Constitutional: Negative for malaise, fever and chills. No significant weight loss or weight gain Eyes: Negative for eye pain, redness and discharge, diplopia, visual changes, or flashes of light. ENMT: Negative for ear pain, hoarseness, nasal congestion, sinus pressure and sore throat. No headaches; tinnitus, drooling, or problem swallowing. Cardiovascular: Negative for chest pain, palpitations, diaphoresis, dyspnea and peripheral edema. ; No orthopnea, PND Respiratory: Negative for cough, hemoptysis, wheezing and stridor. No pleuritic chestpain. Gastrointestinal: Negative for nausea, vomiting, diarrhea, constipation, abdominal pain, melena, blood in stool, hematemesis, jaundice and rectal bleeding.    Genitourinary: Negative for frequency, dysuria, incontinence,flank pain and hematuria; Musculoskeletal: Negative for back pain and neck pain. Negative for swelling and trauma.;  Skin: . Negative for pruritus, rash, abrasions, bruising and skin lesion.; ulcerations Neuro: Negative for headache, lightheadedness and neck stiffness. Negative for weakness, altered level of consciousness , altered mental status, extremity weakness, burning feet, involuntary movement, seizure and syncope.  Psych: negative for anxiety, depression,  insomnia, tearfulness, panic attacks, hallucinations, paranoia, suicidal or homicidal ideation    Past Medical History:  Diagnosis Date  . Anxiety   . Depression   . DVT (deep venous thrombosis) (HCC)   . Facet syndrome, lumbar   . GERD (gastroesophageal reflux disease)   . Hypertension   . Lumbago   . 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 Obesity   . Primary localized osteoarthrosis, lower leg   . Thoracic or lumbosacral neuritis or radiculitis, unspecified    parapersis    Past Surgical History:  Procedure Laterality Date  . ABDOMINAL HYSTERECTOMY    . CESAREAN SECTION     x2  . CHOLECYSTECTOMY      Medications:  HOME MEDS: Prior to Admission medications   Medication Sig Start Date End Date Taking? Authorizing Provider  acetaminophen (TYLENOL) 500 MG tablet Take 1,000 mg by mouth every 8 (eight) hours as needed.   Yes Historical Provider, MD  ALPRAZolam Prudy Feeler) 0.5 MG tablet Take 0.5 mg by mouth every 6 (six) hours as needed for anxiety. For anxiety.   Yes Historical Provider, MD  Alum Hydroxide-Mag Carbonate (GAVISCON EXTRA STRENGTH) 160-105 MG CHEW Chew 2 tablets by mouth 4 (four) times daily.   Yes Historical Provider, MD  aspirin EC 81 MG tablet Take 81 mg by mouth daily.   Yes Historical Provider, MD  bisacodyl (DULCOLAX) 5 MG EC tablet Take 10 mg by mouth every morning.   Yes Historical Provider, MD  calcium carbonate (TUMS - DOSED IN MG ELEMENTAL CALCIUM) 500 MG chewable tablet Chew 1 tablet by mouth 3 (three) times daily with meals.    Yes Historical Provider, MD  esomeprazole (NEXIUM) 40 MG capsule Take 40 mg by mouth 2 (two) times daily before a meal.   Yes Historical Provider, MD  fentaNYL (DURAGESIC - DOSED MCG/HR) 100 MCG/HR Place 100 mcg onto the skin every 3 (three) days.   Yes Historical Provider, MD  fentaNYL (DURAGESIC - DOSED MCG/HR) 50 MCG/HR Place 50 mcg onto the skin every 3 (three) days.   Yes Historical Provider, MD   fentaNYL (DURAGESIC - DOSED MCG/HR) 50 MCG/HR Place 50 mcg onto the skin every 3 (three) days.   Yes Historical Provider, MD  losartan (COZAAR) 25 MG tablet Take 25 mg by mouth daily.    Yes Historical Provider, MD  magnesium oxide (MAG-OX) 400 MG tablet Take by mouth 2 (two) times daily.   Yes Historical Provider, MD  naloxegol oxalate (MOVANTIK) 25 MG TABS tablet Take 25 mg by mouth daily.   Yes Historical Provider, MD  nitroGLYCERIN (NITROSTAT) 0.4 MG SL tablet Place 0.4 mg under the tongue every 5 (five) minutes as needed for chest pain.   Yes Historical Provider, MD  ondansetron (ZOFRAN-ODT) 8 MG disintegrating tablet Take 8 mg by mouth every 8 (eight) hours as needed for nausea or vomiting.   Yes Historical Provider, MD  Oxycodone HCl 10 MG TABS Take 10 mg by mouth every 6 (six) hours as needed (for pain).   Yes Historical Provider, MD  potassium chloride SA (K-DUR,KLOR-CON) 20 MEQ tablet Take 20 mEq by mouth 3 (three) times daily.    Yes Historical Provider, MD  promethazine (PHENERGAN) 25 MG suppository Place 25 mg rectally every 8 (eight) hours as needed for nausea or vomiting.   Yes Historical Provider, MD  promethazine (PHENERGAN) 25 MG tablet Take 25 mg by mouth every 8 (eight) hours as needed for nausea or vomiting.   Yes Historical Provider, MD  promethazine (PHENERGAN) 25 MG/ML injection Inject 25 mg into the vein every 6 (six) hours as needed for nausea or vomiting.   Yes Historical Provider, MD  ranitidine (ZANTAC) 75 MG tablet Take 75 mg by mouth 2 (two) times daily.   Yes Historical Provider, MD  saccharomyces boulardii (FLORASTOR) 250 MG capsule Take 250 mg by mouth daily.   Yes Historical Provider, MD  sertraline (ZOLOFT) 100 MG tablet Take 100 mg by mouth at bedtime.    Yes Historical Provider, MD  torsemide (DEMADEX) 20 MG tablet Take 80 mg by mouth daily.   Yes Historical Provider, MD  Vitamin D, Ergocalciferol, (DRISDOL) 50000 UNITS CAPS capsule Take 50,000 Units by mouth  every 30 (thirty) days. Takes on the 20th of each month.   Yes Historical Provider, MD  WHEY PROTEIN PO Take by mouth daily.   Yes Historical Provider, MD     Allergies:  Allergies  Allergen Reactions  . Sulfa Antibiotics Itching and Swelling    Social History:   reports that she has never smoked. She has never used smokeless tobacco. She reports that she does not drink alcohol or use drugs.  Family History: Family History  Problem Relation Age of Onset  . Asthma Mother   . COPD Mother   . Heart disease Mother   . Heart disease Father   . Colon cancer Neg Hx      Physical Exam: Vitals:   09/08/15 0500 09/08/15 1445 09/08/15 2222 09/09/15 0655  BP: 101/73 98/82 97/61  99/60  Pulse: (!) 108 96 98 98  Resp: 16 18 19 20   Temp: 99.7 F (37.6 C) 98.7 F (37.1 C) 99 F (37.2 C) 98.4 F (36.9 C)  TempSrc: Oral Oral Oral Oral  SpO2: 99% 100% 99% 99%  Weight:  Height:       Blood pressure 99/60, pulse 98, temperature 98.4 F (36.9 C), temperature source Oral, resp. rate 20, height  (1.702 m), weight (!) 152.6 kg (336 lb 6.8 oz), SpO2 99 %.  GEN:  Pleasant patient lying in the stretcher in no acute distress; cooperative with exam. PSYCH:  alert and oriented x4; does not appear anxious or depressed; affect is appropriate. HEENT: Mucous membranes pink and anicteric; PERRLA; EOM intact; no cervical lymphadenopathy nor thyromegaly or carotid bruit; no JVD; There were no stridor. Neck is very supple. Breasts:: Not examined CHEST WALL: No tenderness CHEST: Normal respiration, clear to auscultation bilaterally.  HEART: Regular rate and rhythm.  There are no murmur, rub, or gallops.   BACK: No kyphosis or scoliosis; no CVA tenderness ABDOMEN: soft and non-tender; no masses, no organomegaly, normal abdominal bowel sounds; no pannus; no intertriginous candida. There is no rebound and no distention. Rectal Exam: Not done EXTREMITIES: No bone or joint deformity; age-appropriate  arthropathy of the hands and knees; no edema; no ulcerations.  There is no calf tenderness. Genitalia: not examined PULSES: 2+ and symmetric SKIN: Normal hydration no rash or ulceration CNS: lower extremity weakness.   She has edema.    Labs on Admission:  Basic Metabolic Panel:  Recent Labs Lab 09/07/15 1738 09/08/15 0438 09/09/15 0504  NA 131* 131* 134*  K 2.1* 2.5* 3.0*  CL 89* 92* 95*  CO2 34* 30 31  GLUCOSE 96 78 81  BUN CREATININE 0.43* 0.42* 0.41*  CALCIUM 6.6* 6.5* 6.7*  MG  --  1.6*  --    Liver Function Tests:  Recent Labs Lab 09/07/15 1738 09/08/15 0438 09/09/15 0504  AST 57* 60* 69*  ALT ALKPHOS 102 105 104  BILITOT 1.5* 2.1* 1.6*  PROT 5.7* 5.8* 5.4*  ALBUMIN 2.0* 2.0* 1.8*   No results for input(s): LIPASE, AMYLASE in the last 168 hours. No results for input(s): AMMONIA in the last 168 hours. CBC:  Recent Labs Lab 09/07/15 1738 09/08/15 0438  WBC 7.8 16.0*  NEUTROABS 4.5  --   HGB 12.3 12.0  HCT 34.3* 34.4*  MCV 100.9* 101.8*  PLT 289 307    Assessment/Plan Present on Admission: . Hypokalemia . Constipation . Lumbar facet arthropathy . Morbid obesity (HCC)  PLAN:  Edema:  I suspect it is due to low oncotic pressure.  She has proteinuria, so we will need to quantitate it.   Hypokalemia:  Will need chronic replacement.  Her total deficit is huge.  Will need Mag replacement.  Fatigue:  Likely depression, medication, and low testosterone level.  Will check level.  Check TSH, and d/c Zoloft.   Macrocytic anemia:  Mild.  Check B12, and check folate.   Other plans as per orders. Code Status: FULL Unk Lightning, MD.  FACP [facility] Pager [phone] 7pm to 7am.  09/09/2015, 5:07 PM

## 2015-09-10 DIAGNOSIS — L8990 Pressure ulcer of unspecified site, unspecified stage: Secondary | ICD-10-CM | POA: Insufficient documentation

## 2015-09-10 DIAGNOSIS — G4733 Obstructive sleep apnea (adult) (pediatric): Secondary | ICD-10-CM

## 2015-09-10 LAB — BASIC METABOLIC PANEL
Anion gap: 7 (ref 5–15)
BUN: 8 mg/dL (ref 6–20)
CALCIUM: 6.6 mg/dL — AB (ref 8.9–10.3)
CO2: 29 mmol/L (ref 22–32)
CREATININE: 0.41 mg/dL — AB (ref 0.44–1.00)
Chloride: 95 mmol/L — ABNORMAL LOW (ref 101–111)
GFR calc non Af Amer: >60 mL/min (ref >60)
Glucose, Bld: 98 mg/dL (ref 65–99)
Potassium: 3.3 mmol/L — ABNORMAL LOW (ref 3.5–5.1)
SODIUM: 131 mmol/L — AB (ref 135–145)

## 2015-09-10 LAB — TESTOSTERONE: Testosterone: 10 ng/dL (ref 3–41)

## 2015-09-10 NOTE — Progress Notes (Signed)
[facility] PROGRESS NOTE  [name] ZOX:[id number] DOB: 1955/02/09    PCP:   Josue Hector, MD   HPI:  [name] is an 60 y.o. female with hx of depression, morbid obesity, chronic nausea for unclear reason, GERD, cervical spinal stenosis, functional quadriplegia, admitted with persistent hypokalemia, hypomagnesemia, and unable to take oral supplements.  She has no abdominal pain, fever, or chills.   She has been on SSRI without good effects, and admitted this time for persistent electrolyte abnormality, and UTI.  She was started on IV Rocephin and supplementation of her K. A PICC line was placed, and she was given some IVF.  Her SSRI was discontinued.  Further work up included a 24 hour urine collection to quantitate her proteinuria, and her TSH and testosterone was normal.  She also has chronic foley.  She did have some improvement.   Rewiew of Systems:  Constitutional: Negative for malaise, fever and chills. No significant weight loss or weight gain Eyes: Negative for eye pain, redness and discharge, diplopia, visual changes, or flashes of light. ENMT: Negative for ear pain, hoarseness, nasal congestion, sinus pressure and sore throat. No headaches; tinnitus, drooling, or problem swallowing. Cardiovascular: Negative for chest pain, palpitations, diaphoresis, dyspnea and peripheral edema. ; No orthopnea, PND Respiratory: Negative for cough, hemoptysis, wheezing and stridor. No pleuritic chestpain. Gastrointestinal: Negative for nausea, vomiting, diarrhea, constipation, abdominal pain, melena, blood in stool, hematemesis, jaundice and rectal bleeding.    Genitourinary: Negative for frequency, dysuria, incontinence,flank pain and hematuria; Musculoskeletal: Negative for back pain and neck pain. Negative for swelling and trauma.;  Skin: . Negative for pruritus, rash, abrasions, bruising and skin lesion.; ulcerations Neuro: Negative for headache, lightheadedness and neck  stiffness. Negative for weakness, altered level of consciousness , altered mental status, extremity weakness, burning feet, involuntary movement, seizure and syncope.  Psych: negative for anxiety, depression, insomnia, tearfulness, panic attacks, hallucinations, paranoia, suicidal or homicidal ideation    Past Medical History:  Diagnosis Date  . Anxiety   . Depression   . DVT (deep venous thrombosis) (HCC)   . Facet syndrome, lumbar   . GERD (gastroesophageal reflux disease)   . Hypertension   . Lumbago   . 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 Obesity   . Primary localized osteoarthrosis, lower leg   . Thoracic or lumbosacral neuritis or radiculitis, unspecified    parapersis    Past Surgical History:  Procedure Laterality Date  . ABDOMINAL HYSTERECTOMY    . CESAREAN SECTION     x2  . CHOLECYSTECTOMY      Medications:  HOME MEDS: Prior to Admission medications   Medication Sig Start Date End Date Taking? Authorizing Provider  acetaminophen (TYLENOL) 500 MG tablet Take 1,000 mg by mouth every 8 (eight) hours as needed.   Yes Historical Provider, MD  ALPRAZolam Prudy Feeler) 0.5 MG tablet Take 0.5 mg by mouth every 6 (six) hours as needed for anxiety. For anxiety.   Yes Historical Provider, MD  Alum Hydroxide-Mag Carbonate (GAVISCON EXTRA STRENGTH) 160-105 MG CHEW Chew 2 tablets by mouth 4 (four) times daily.   Yes Historical Provider, MD  aspirin EC 81 MG tablet Take 81 mg by mouth daily.   Yes Historical Provider, MD  bisacodyl (DULCOLAX) 5 MG EC tablet Take 10 mg by mouth every morning.   Yes Historical Provider, MD  calcium carbonate (TUMS - DOSED IN MG ELEMENTAL CALCIUM) 500 MG chewable tablet Chew 1 tablet by mouth  3 (three) times daily with meals.    Yes Historical Provider, MD  esomeprazole (NEXIUM) 40 MG capsule Take 40 mg by mouth 2 (two) times daily before a meal.   Yes Historical Provider, MD  fentaNYL (DURAGESIC - DOSED MCG/HR) 100 MCG/HR  Place 100 mcg onto the skin every 3 (three) days.   Yes Historical Provider, MD  fentaNYL (DURAGESIC - DOSED MCG/HR) 50 MCG/HR Place 50 mcg onto the skin every 3 (three) days.   Yes Historical Provider, MD  fentaNYL (DURAGESIC - DOSED MCG/HR) 50 MCG/HR Place 50 mcg onto the skin every 3 (three) days.   Yes Historical Provider, MD  losartan (COZAAR) 25 MG tablet Take 25 mg by mouth daily.    Yes Historical Provider, MD  magnesium oxide (MAG-OX) 400 MG tablet Take by mouth 2 (two) times daily.   Yes Historical Provider, MD  naloxegol oxalate (MOVANTIK) 25 MG TABS tablet Take 25 mg by mouth daily.   Yes Historical Provider, MD  nitroGLYCERIN (NITROSTAT) 0.4 MG SL tablet Place 0.4 mg under the tongue every 5 (five) minutes as needed for chest pain.   Yes Historical Provider, MD  ondansetron (ZOFRAN-ODT) 8 MG disintegrating tablet Take 8 mg by mouth every 8 (eight) hours as needed for nausea or vomiting.   Yes Historical Provider, MD  Oxycodone HCl 10 MG TABS Take 10 mg by mouth every 6 (six) hours as needed (for pain).   Yes Historical Provider, MD  potassium chloride SA (K-DUR,KLOR-CON) 20 MEQ tablet Take 20 mEq by mouth 3 (three) times daily.    Yes Historical Provider, MD  promethazine (PHENERGAN) 25 MG suppository Place 25 mg rectally every 8 (eight) hours as needed for nausea or vomiting.   Yes Historical Provider, MD  promethazine (PHENERGAN) 25 MG tablet Take 25 mg by mouth every 8 (eight) hours as needed for nausea or vomiting.   Yes Historical Provider, MD  promethazine (PHENERGAN) 25 MG/ML injection Inject 25 mg into the vein every 6 (six) hours as needed for nausea or vomiting.   Yes Historical Provider, MD  ranitidine (ZANTAC) 75 MG tablet Take 75 mg by mouth 2 (two) times daily.   Yes Historical Provider, MD  saccharomyces boulardii (FLORASTOR) 250 MG capsule Take 250 mg by mouth daily.   Yes Historical Provider, MD  sertraline (ZOLOFT) 100 MG tablet Take 100 mg by mouth at bedtime.    Yes  Historical Provider, MD  torsemide (DEMADEX) 20 MG tablet Take 80 mg by mouth daily.   Yes Historical Provider, MD  Vitamin D, Ergocalciferol, (DRISDOL) 50000 UNITS CAPS capsule Take 50,000 Units by mouth every 30 (thirty) days. Takes on the 20th of each month.   Yes Historical Provider, MD  WHEY PROTEIN PO Take by mouth daily.   Yes Historical Provider, MD     Allergies:  Allergies  Allergen Reactions  . Sulfa Antibiotics Itching and Swelling    Social History:   reports that she has never smoked. She has never used smokeless tobacco. She reports that she does not drink alcohol or use drugs.  Family History: Family History  Problem Relation Age of Onset  . Asthma Mother   . COPD Mother   . Heart disease Mother   . Heart disease Father   . Colon cancer Neg Hx      Physical Exam: Vitals:   09/09/15 2136 09/09/15 2251 09/10/15 0550 09/10/15 1425  BP: 110/68  128/71 121/74  Pulse: 88 98 87 91  Resp: 20  20  20  Temp: 98.2 F (36.8 C)  98.9 F (37.2 C) 98.6 F (37 C)  TempSrc: Oral  Oral   SpO2: 100% 99% 100% 100%  Weight:      Height:       Blood pressure 121/74, pulse 91, temperature 98.6 F (37 C), resp. rate 20, height 5\' 7"  (1.702 m), weight (!) 152.6 kg (336 lb 6.8 oz), SpO2 100 %.  GEN:  Pleasant  patient lying in the stretcher in no acute distress; cooperative with exam. PSYCH:  alert and oriented x4; does not appear anxious or depressed; affect is appropriate. HEENT: Mucous membranes pink and anicteric; PERRLA; EOM intact; no cervical lymphadenopathy nor thyromegaly or carotid bruit; no JVD; There were no stridor. Neck is very supple. Breasts:: Not examined CHEST WALL: No tenderness CHEST: Normal respiration, clear to auscultation bilaterally.  HEART: Regular rate and rhythm.  There are no murmur, rub, or gallops.   BACK: No kyphosis or scoliosis; no CVA tenderness ABDOMEN: soft and non-tender; no masses, no organomegaly, normal abdominal bowel sounds; no  pannus; no intertriginous candida. There is no rebound and no distention. Rectal Exam: Not done EXTREMITIES: No bone or joint deformity; age-appropriate arthropathy of the hands and knees; no edema; no ulcerations.  There is no calf tenderness. Genitalia: not examined PULSES: 2+ and symmetric SKIN: Normal hydration no rash or ulceration CNS: Cranial nerves 2-12 grossly intact no focal lateralizing neurologic deficit.  Speech is fluent; uvula elevated with phonation, facial symmetry and tongue midline. DTR are normal bilaterally, cerebella exam is intact, barbinski is negative and strengths are equaled bilaterally.  No sensory loss.   Labs on Admission:  Basic Metabolic Panel:  Recent Labs Lab 09/07/15 1738 09/08/15 0438 09/09/15 0504 09/10/15 0538  NA 131* 131* 134* 131*  K 2.1* 2.5* 3.0* 3.3*  CL 89* 92* 95* 95*  CO2 34* 30 31 29   GLUCOSE 96 78 81 98  BUN 8 8 8 8   CREATININE 0.43* 0.42* 0.41* 0.41*  CALCIUM 6.6* 6.5* 6.7* 6.6*  MG  --  1.6*  --   --    Liver Function Tests:  Recent Labs Lab 09/07/15 1738 09/08/15 0438 09/09/15 0504  AST 57* 60* 69*  ALT 26 29 28   ALKPHOS 102 105 104  BILITOT 1.5* 2.1* 1.6*  PROT 5.7* 5.8* 5.4*  ALBUMIN 2.0* 2.0* 1.8*   CBC:  Recent Labs Lab 09/07/15 1738 09/08/15 0438  WBC 7.8 16.0*  NEUTROABS 4.5  --   HGB 12.3 12.0  HCT 34.3* 34.4*  MCV 100.9* 101.8*  PLT 289 307   Assessment/Plan Present on Admission: . Hypokalemia . Constipation . Lumbar facet arthropathy . Morbid obesity (HCC) . Obstructive sleep apnea  PLAN:  Edema:  I suspect it is due to low oncotic pressure.  She has proteinuria, so we will need to quantitate it.   Hypokalemia:  Will need chronic replacement.  Her total deficit is huge.  Will need Mag replacement.  Fatigue:  Likely depression, medication.  TSH and testosterone were normal.    Macrocytic anemia:  Mild.  Check B12, and check folate.    Other plans as per orders. Code Status:  FULL  Unk Lightning, MD.  FACP [facility] Pager [phone] 7pm to 7am.  09/10/2015, 5:40 PM

## 2015-09-11 LAB — COMPREHENSIVE METABOLIC PANEL
ALT: 32 U/L (ref 14–54)
AST: 57 U/L — ABNORMAL HIGH (ref 15–41)
Albumin: 1.8 g/dL — ABNORMAL LOW (ref 3.5–5.0)
Alkaline Phosphatase: 114 U/L (ref 38–126)
Anion gap: 6 (ref 5–15)
BUN: 7 mg/dL (ref 6–20)
CALCIUM: 6.6 mg/dL — AB (ref 8.9–10.3)
CHLORIDE: 98 mmol/L — AB (ref 101–111)
CO2: 28 mmol/L (ref 22–32)
CREATININE: 0.39 mg/dL — AB (ref 0.44–1.00)
Glucose, Bld: 87 mg/dL (ref 65–99)
Potassium: 3.9 mmol/L (ref 3.5–5.1)
SODIUM: 132 mmol/L — AB (ref 135–145)
TOTAL PROTEIN: 5.3 g/dL — AB (ref 6.5–8.1)
Total Bilirubin: 1.5 mg/dL — ABNORMAL HIGH (ref 0.3–1.2)

## 2015-09-11 LAB — UIFE/LIGHT CHAINS/TP QN, 24-HR UR
% BETA, URINE: 27.4 %
ALPHA 1 URINE: 5.2 %
Albumin, U: 28.2 %
Alpha 2, Urine: 18.8 %
FREE KAPPA/LAMBDA RATIO: 9.28 (ref 2.04–10.37)
FREE LAMBDA LT CHAINS, UR: 20.9 mg/L — AB (ref 0.24–6.66)
FREE LT CHN EXCR RATE: 194 mg/L — AB (ref 1.35–24.19)
GAMMA GLOBULIN URINE: 20.3 %
Total Protein, Urine-Ur/day: 248 mg/24 hr — ABNORMAL HIGH (ref 30–150)
Total Protein, Urine: 24.8 mg/dL
VOLUME, URINE-UPE24: 1000 mL

## 2015-09-11 LAB — VITAMIN B12: VITAMIN B 12: 611 pg/mL (ref 180–914)

## 2015-09-11 LAB — MAGNESIUM: MAGNESIUM: 1.7 mg/dL (ref 1.7–2.4)

## 2015-09-11 MED ORDER — PRO-STAT SUGAR FREE PO LIQD
30.0000 mL | Freq: Two times a day (BID) | ORAL | Status: DC
  Administered 2015-09-11 – 2015-09-12 (×2): 30 mL via ORAL
  Filled 2015-09-11 (×2): qty 30

## 2015-09-11 MED ORDER — ONDANSETRON HCL 4 MG/2ML IJ SOLN
4.0000 mg | Freq: Three times a day (TID) | INTRAMUSCULAR | Status: DC | PRN
Start: 2015-09-11 — End: 2015-09-12
  Administered 2015-09-11: 4 mg via INTRAVENOUS
  Filled 2015-09-11: qty 2

## 2015-09-11 MED ORDER — NYSTATIN 100000 UNIT/ML MT SUSP
5.0000 mL | Freq: Four times a day (QID) | OROMUCOSAL | Status: DC
  Administered 2015-09-11 – 2015-09-12 (×4): 500000 [IU] via ORAL
  Filled 2015-09-11 (×4): qty 5

## 2015-09-11 MED ORDER — BOOST / RESOURCE BREEZE PO LIQD
1.0000 | ORAL | Status: DC
  Administered 2015-09-11: 1 via ORAL

## 2015-09-11 MED ORDER — ALTEPLASE 2 MG IJ SOLR
2.0000 mg | Freq: Once | INTRAMUSCULAR | Status: AC
  Administered 2015-09-11: 2 mg
  Filled 2015-09-11: qty 2

## 2015-09-11 NOTE — Progress Notes (Signed)
Patient refused CPAP for tonight. Says she has been throwing up and doesn't want to wear it.

## 2015-09-11 NOTE — Clinical Social Work Note (Signed)
CSW updated [facility] [facility] on pt. Aware of probable weekend d/c.   Derenda Fennel, LCSW [phone]

## 2015-09-11 NOTE — Progress Notes (Signed)
Patient notified that her Fentanyl patch 50 mcg was discontinued and that I had to remove it. She stated "you will not remove it and the doctor will not discontinue it. I will not lay here in pain and have my pain meds taken away." Dr. Conley Rolls was paged to notify him of her refusal.

## 2015-09-11 NOTE — Progress Notes (Signed)
[facility] PROGRESS NOTE  [name] SPQ:[id number] DOB: 08/26/1955    PCP:   Josue Hector, MD   HPI:  Donna Deshpande Knightis an 60 y.o.femalewith hx of depression, morbid obesity, chronic nausea for unclear reason, GERD, cervical spinal stenosis, functional quadriplegia, admitted with persistent hypokalemia, hypomagnesemia, and unable to take oral supplements. She has no abdominal pain, fever, or chills. She has been on SSRI without good effects, and admitted this time for persistent electrolyte abnormality, and UTI. She was started on IV Rocephin and supplementation of her K. A PICC line was placed, and she was given some IVF.  Her SSRI was discontinued.  Further work up included a 24 hour urine collection to quantitate her proteinuria, and her TSH and testosterone was normal.  She also has chronic foley. She did have some improvement.  She started to have increase dysphagia, but her nausea has improved.  She is still on full liquid.   Rewiew of Systems:  Constitutional: Negative for malaise, fever and chills. No significant weight loss or weight gain Eyes: Negative for eye pain, redness and discharge, diplopia, visual changes, or flashes of light. ENMT: Negative for ear pain, hoarseness, nasal congestion, sinus pressure and sore throat. No headaches; tinnitus, drooling, or problem swallowing. Cardiovascular: Negative for chest pain, palpitations, diaphoresis, dyspnea and peripheral edema. ; No orthopnea, PND Respiratory: Negative for cough, hemoptysis, wheezing and stridor. No pleuritic chestpain. Gastrointestinal: Negative for nausea, vomiting, diarrhea, constipation, abdominal pain, melena, blood in stool, hematemesis, jaundice and rectal bleeding.    Genitourinary: Negative for frequency, dysuria, incontinence,flank pain and hematuria; Musculoskeletal: Negative for back pain and neck pain. Negative for swelling and trauma.;  Skin: . Negative for pruritus, rash,  abrasions, bruising and skin lesion.; ulcerations Neuro: Negative for headache, lightheadedness and neck stiffness. Negative for weakness, altered level of consciousness , altered mental status, extremity weakness, burning feet, involuntary movement, seizure and syncope.  Psych: negative for anxiety, depression, insomnia, tearfulness, panic attacks, hallucinations, paranoia, suicidal or homicidal ideation  Past Medical History:  Diagnosis Date  . Anxiety   . Depression   . DVT (deep venous thrombosis) (HCC)   . Facet syndrome, lumbar   . GERD (gastroesophageal reflux disease)   . Hypertension   . Lumbago   . 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 Obesity   . Primary localized osteoarthrosis, lower leg   . Thoracic or lumbosacral neuritis or radiculitis, unspecified    parapersis    Past Surgical History:  Procedure Laterality Date  . ABDOMINAL HYSTERECTOMY    . CESAREAN SECTION     x2  . CHOLECYSTECTOMY      Medications:  HOME MEDS: Prior to Admission medications   Medication Sig Start Date End Date Taking? Authorizing Provider  acetaminophen (TYLENOL) 500 MG tablet Take 1,000 mg by mouth every 8 (eight) hours as needed.   Yes Historical Provider, MD  ALPRAZolam Prudy Feeler) 0.5 MG tablet Take 0.5 mg by mouth every 6 (six) hours as needed for anxiety. For anxiety.   Yes Historical Provider, MD  Alum Hydroxide-Mag Carbonate (GAVISCON EXTRA STRENGTH) 160-105 MG CHEW Chew 2 tablets by mouth 4 (four) times daily.   Yes Historical Provider, MD  aspirin EC 81 MG tablet Take 81 mg by mouth daily.   Yes Historical Provider, MD  bisacodyl (DULCOLAX) 5 MG EC tablet Take 10 mg by mouth every morning.   Yes Historical Provider, MD  calcium carbonate (TUMS - DOSED IN MG ELEMENTAL CALCIUM)  500 MG chewable tablet Chew 1 tablet by mouth 3 (three) times daily with meals.    Yes Historical Provider, MD  esomeprazole (NEXIUM) 40 MG capsule Take 40 mg by mouth 2 (two) times  daily before a meal.   Yes Historical Provider, MD  fentaNYL (DURAGESIC - DOSED MCG/HR) 100 MCG/HR Place 100 mcg onto the skin every 3 (three) days.   Yes Historical Provider, MD  fentaNYL (DURAGESIC - DOSED MCG/HR) 50 MCG/HR Place 50 mcg onto the skin every 3 (three) days.   Yes Historical Provider, MD  fentaNYL (DURAGESIC - DOSED MCG/HR) 50 MCG/HR Place 50 mcg onto the skin every 3 (three) days.   Yes Historical Provider, MD  losartan (COZAAR) 25 MG tablet Take 25 mg by mouth daily.    Yes Historical Provider, MD  magnesium oxide (MAG-OX) 400 MG tablet Take by mouth 2 (two) times daily.   Yes Historical Provider, MD  naloxegol oxalate (MOVANTIK) 25 MG TABS tablet Take 25 mg by mouth daily.   Yes Historical Provider, MD  nitroGLYCERIN (NITROSTAT) 0.4 MG SL tablet Place 0.4 mg under the tongue every 5 (five) minutes as needed for chest pain.   Yes Historical Provider, MD  ondansetron (ZOFRAN-ODT) 8 MG disintegrating tablet Take 8 mg by mouth every 8 (eight) hours as needed for nausea or vomiting.   Yes Historical Provider, MD  Oxycodone HCl 10 MG TABS Take 10 mg by mouth every 6 (six) hours as needed (for pain).   Yes Historical Provider, MD  potassium chloride SA (K-DUR,KLOR-CON) 20 MEQ tablet Take 20 mEq by mouth 3 (three) times daily.    Yes Historical Provider, MD  promethazine (PHENERGAN) 25 MG suppository Place 25 mg rectally every 8 (eight) hours as needed for nausea or vomiting.   Yes Historical Provider, MD  promethazine (PHENERGAN) 25 MG tablet Take 25 mg by mouth every 8 (eight) hours as needed for nausea or vomiting.   Yes Historical Provider, MD  promethazine (PHENERGAN) 25 MG/ML injection Inject 25 mg into the vein every 6 (six) hours as needed for nausea or vomiting.   Yes Historical Provider, MD  ranitidine (ZANTAC) 75 MG tablet Take 75 mg by mouth 2 (two) times daily.   Yes Historical Provider, MD  saccharomyces boulardii (FLORASTOR) 250 MG capsule Take 250 mg by mouth daily.   Yes  Historical Provider, MD  sertraline (ZOLOFT) 100 MG tablet Take 100 mg by mouth at bedtime.    Yes Historical Provider, MD  torsemide (DEMADEX) 20 MG tablet Take 80 mg by mouth daily.   Yes Historical Provider, MD  Vitamin D, Ergocalciferol, (DRISDOL) 50000 UNITS CAPS capsule Take 50,000 Units by mouth every 30 (thirty) days. Takes on the 20th of each month.   Yes Historical Provider, MD  WHEY PROTEIN PO Take by mouth daily.   Yes Historical Provider, MD     Allergies:  Allergies  Allergen Reactions  . Sulfa Antibiotics Itching and Swelling    Social History:   reports that she has never smoked. She has never used smokeless tobacco. She reports that she does not drink alcohol or use drugs.  Family History: Family History  Problem Relation Age of Onset  . Asthma Mother   . COPD Mother   . Heart disease Mother   . Heart disease Father   . Colon cancer Neg Hx      Physical Exam: Vitals:   09/10/15 1425 09/10/15 2007 09/10/15 2255 09/11/15 0515  BP: 121/74 125/65  108/60  Pulse:  91 (!) 104 (!) 110 (!) 102  Resp: 20 (!) Temp: 98.6 F (37 C) 97.8 F (36.6 C)  97.1 F (36.2 C)  TempSrc:  Axillary  Oral  SpO2: 100% 100% 98% 100%  Weight:    (!) 160 kg (352 lb 11.8 oz)  Height:       Blood pressure 108/60, pulse (!) 102, temperature 97.1 F (36.2 C), temperature source Oral, resp. rate 20, height  (1.702 m), weight (!) 160 kg (352 lb 11.8 oz), SpO2 100 %.  GEN:  Pleasant  patient lying in the stretcher in no acute distress; cooperative with exam. PSYCH:  alert and oriented x4; does not appear anxious or depressed; affect is appropriate. HEENT: Mucous membranes pink and anicteric; PERRLA; EOM intact; no cervical lymphadenopathy nor thyromegaly or carotid bruit; no JVD; There were no stridor. Neck is very supple. Breasts:: Not examined CHEST WALL: No tenderness CHEST: Normal respiration, clear to auscultation bilaterally.  HEART: Regular rate and rhythm.  There  are no murmur, rub, or gallops.   BACK: No kyphosis or scoliosis; no CVA tenderness ABDOMEN: soft and non-tender; no masses, no organomegaly, normal abdominal bowel sounds; no pannus; no intertriginous candida. There is no rebound and no distention. Rectal Exam: Not done EXTREMITIES: No bone or joint deformity; age-appropriate arthropathy of the hands and knees; no edema; no ulcerations.  There is no calf tenderness. Genitalia: not examined PULSES: 2+ and symmetric SKIN: Normal hydration no rash or ulceration CNS: Cranial nerves 2-12 grossly intact no focal lateralizing neurologic deficit.  Speech is fluent; uvula elevated with phonation, facial symmetry and tongue midline. DTR are normal bilaterally, cerebella exam is intact, barbinski is negative and strengths are equaled bilaterally.  No sensory loss.   Labs on Admission:  Basic Metabolic Panel:  Recent Labs Lab 09/07/15 1738 09/08/15 0438 09/09/15 0504 09/10/15 0538 09/11/15 0614  NA 131* 131* 134* 131* 132*  K 2.1* 2.5* 3.0* 3.3* 3.9  CL 89* 92* 95* 95* 98*  CO2 34* GLUCOSE 96 78 81 98 87  BUN CREATININE 0.43* 0.42* 0.41* 0.41* 0.39*  CALCIUM 6.6* 6.5* 6.7* 6.6* 6.6*  MG  --  1.6*  --   --  1.7   Liver Function Tests:  Recent Labs Lab 09/07/15 1738 09/08/15 0438 09/09/15 0504 09/11/15 0614  AST 57* 60* 69* 57*  ALT 32  ALKPHOS 102 105 104 114  BILITOT 1.5* 2.1* 1.6* 1.5*  PROT 5.7* 5.8* 5.4* 5.3*  ALBUMIN 2.0* 2.0* 1.8* 1.8*    Recent Labs Lab 09/07/15 1738 09/08/15 0438  WBC 7.8 16.0*  NEUTROABS 4.5  --   HGB 12.3 12.0  HCT 34.3* 34.4*  MCV 100.9* 101.8*  PLT 289 307    Assessment/Plan Present on Admission: . Hypokalemia . Constipation . Lumbar facet arthropathy . Morbid obesity (HCC) . Obstructive sleep apnea  PLAN:  Edema: I suspect it is due to low oncotic pressure. She has proteinuria, so we will need to quantitate it.   Hypokalemia: Will need chronic  replacement. Her total deficit is huge. Will need Mag replacement.  Fatigue: Likely depression, medication.  TSH and testosterone were normal.    Macrocytic anemia: Mild. Check B12, and check folate.   Candidiasis:  Will give Nystatin swish and swallow.    UTI:  Treated with Rocephin, will d/c IV antibiotic today.    Other plans as per orders. Code Status:  Boykin Peek, MD.  FACP [facility] Pager [phone] 7pm to 7am.  09/11/2015, 12:50 PM

## 2015-09-11 NOTE — Progress Notes (Signed)
Initial Nutrition Assessment  DOCUMENTATION CODES:  Morbid obesity  INTERVENTION:  Monitor BM/Constipation, she reports being on 3 different medications for constipation, taken daily.   Will order 30 mL Prostat BID, each supplement provides 100 kcal and 15 grams of protein.  Boost Breeze po q 24 hrs, each supplement provides 250 kcal and 9 grams of protein  NUTRITION DIAGNOSIS:  Inadequate oral intake related to poor appetite, vomiting, nausea as evidenced by per patient report of eating <1 meal a day for the last 4 months.  GOAL:  Patient will meet greater than or equal to 90% of their needs  MONITOR:  PO intake, Supplement acceptance, Diet advancement, Labs, Weight trends, Skin  REASON FOR ASSESSMENT:  Malnutrition Screening Tool    ASSESSMENT:  60 y/o female PMHx depression/anxiety, morbid obesity, GERD, chronic nausea, GERD, functional quadriplegia. She presents with persistent hypokalemia/magnesemia and inability to take oral supplements. Had some dysphagia.    Per skin report, she has diabetic ulcers, though she does not appear to have diagnosed DM? She also denied having DM.   Pt is a very pleasant morbidly obese female. Pt reports chronic history of poor PO intake. She states that for the last 4 months she has been had decreased appetite, nausea, vomiting. She states for the first of 2 of those 4 months, she was still able to eat "1 good meal a day". However, the latter 2 months, she could hardly eat anything. She does not know if she was on vitamins at SNF. She says she was on a low sodium diet at SNF, but is not sure why. She did take some oral supplements when this first started, but she says she can no longer tolerate them.   She has chronic constipation, she reports taking 2 laxative (dulcolax) and 2 stool softeners DAILY. She also says she started on another constipation medication, but could not recall the name.   In regards to her functional quadriplegia, she states  she cannot move her legs at all. She can move her arms, but notes "It is getting a lot harder".   Pt does report a slight improvement in her symptoms. She says her daughter brought her mashed potatoes and gravy recently and she was able to eat half of it.   RD recommended supplements to help aid in recovery/wound healing. "Ill try anything once". Will order Boost Breeze q 24 and 30 ml Prostat BID.  Also noted meal requests.  In regards to her weight, her most recent measurements are highly suspect of being inaccurate. She does report that her UBW is ~400 lbs and appears to have been weighed at this just 3 weeks ago. Her bed weight does confirm the weight of ~352 lbs, but do not believe it is even feasible to lose 50 lbs in 3 weeks.   NFPE: Subjectively does appear to have some orbital fat wasting, however this may be how pt normally presents.   Medications: Dulcolax, ppi, kcl Labs reviewed: CBGs 80-100 mg/dl, WBC: 16, IHKV:4.2, Total pro:5.3, albumin: 1.8,    Recent Labs Lab 09/08/15 0438 09/09/15 0504 09/10/15 0538 09/11/15 0614  NA 131* 134* 131* 132*  K 2.5* 3.0* 3.3* 3.9  CL 92* 95* 95* 98*  CO2 30 31 29 28   BUN 8 8 8 7   CREATININE 0.42* 0.41* 0.41* 0.39*  CALCIUM 6.5* 6.7* 6.6* 6.6*  MG 1.6*  --   --  1.7  GLUCOSE 78 81 98 87   Diet Order:   Clear LIquid  Skin: Dry, excoriated (scratch marks) to groin, neck, thigh, DM ulcer L buttock stage 2, DM ulcer r thigh, PU stage 2 left buttock  Last BM:  7/29  Height:  Ht Readings from Last 1 Encounters:  09/07/15  (1.702 m)   Weight:  Wt Readings from Last 1 Encounters:  09/11/15 (!) 352 lb 11.8 oz (160 kg)   Wt Readings from Last 10 Encounters:  09/11/15 (!) 352 lb 11.8 oz (160 kg)  05/27/13 (!) 412 lb (186.9 kg)  02/19/12 (!) 379 lb 8 oz (172.1 kg)  11/28/11 (!) 412 lb (186.9 kg)  11/18/11 (!) 412 lb (186.9 kg)  10/21/11 (!) 412 lb 6.4 oz (187.1 kg)  09/21/11 (!) 409 lb (185.5 kg)  08/26/11 (!) 411 lb (186.4  kg)  07/29/11 (!) 411 lb (186.4 kg)  07/01/11 (!) 411 lb (186.4 kg)  Other wt measurements from Care Everywhere and OSH.  408 lbs on 08/19/15 408 lbs on 07/22/15 408 lbs on 06/25/15 415 lbs on 04/29/2013  Ideal Body Weight:  55.27 kg- adjusted for functional quadriplegia  BMI:  Body mass index is 55.25 kg/m.  Estimated Nutritional Needs:  Kcal:  1500-1700 kcals (.6-.7 MSJ equation) Protein:  >83 g (1.5 g/kg ibw) Fluid:  Per MD  EDUCATION NEEDS:  No education needs identified at this time  Christophe Louis RD, LDN, CNSC Clinical Nutrition Pager: [phone] 09/11/2015 4:45 PM

## 2015-09-12 DIAGNOSIS — L8990 Pressure ulcer of unspecified site, unspecified stage: Secondary | ICD-10-CM

## 2015-09-12 MED ORDER — NYSTATIN 100000 UNIT/ML MT SUSP: 5.0000 mL | Freq: Four times a day (QID) | OROMUCOSAL | Status: DC

## 2015-09-12 MED ORDER — BOOST / RESOURCE BREEZE PO LIQD: 1.0000 | ORAL | 0 refills | Status: AC

## 2015-09-12 MED ORDER — MUPIROCIN 2 % EX OINT: 1.0000 "application " | TOPICAL_OINTMENT | Freq: Two times a day (BID) | CUTANEOUS | 0 refills | Status: AC

## 2015-09-12 MED ORDER — NYSTATIN 100000 UNIT/ML MT SUSP: 5.0000 mL | Freq: Four times a day (QID) | OROMUCOSAL | 0 refills | Status: AC

## 2015-09-12 NOTE — Discharge Summary (Addendum)
Physician Discharge Summary  [name] UJW:[id number] DOB: 1955-04-26 DOA: 09/07/2015  PCP: Josue Hector, MD  Admit date: 09/07/2015 Discharge date: 09/12/2015  Time spent: 60 minutes  Recommendations for Outpatient Follow-up:   1. Follow up with PCP next week.  2. Recommended pain clinic.   Discharge Diagnoses:  Active Problems:   Lumbar facet arthropathy   Morbid obesity (HCC)   Constipation   Hypokalemia   Obstructive sleep apnea   Pressure ulcer   Discharge Condition: improved.  She has been able to eat.  Meds have been modified and reduced and pain is tolerable.  She is more alert.  Diet recommendation:  As tolerated.   Filed Weights   09/07/15 2155 09/07/15 2203 09/11/15 0515  Weight: (!) 152.6 kg (336 lb 6.8 oz) (!) 152.6 kg (336 lb 6.8 oz) (!) 160 kg (352 lb 11.8 oz)    History of present illness: Patient was admitted into the hospital for nausea, vomiting, and hypokalemia by Dr Sharl Ma on July 31, 201.  As per his H and P:  " [name]  is a 60 y.o. female, *With a history of degenerative disc disease,  chronic bedbound status post 5 years, on chronic opiates for pain control has been having intractable nausea and vomiting for past 2 months. Apparently patient underwent all the workup including EGD at [facility], she also underwent CT scan abdomen pelvis on 08/27/15 which did not show any significant abnormality. Patient was supposed to Undergo colonoscopy but could not as she was unable to drink GoLYTELY to clean her colon. Patient has chronic constipation. She has been vomiting for the past 2 days and unable to tolerate oral fluids so she was sent to the ED for further evaluation. In the ED UA revealed positive nitrite, lab revealed severe hypokalemia with potassium 2.1. She admits to having chest pain, no shortness of breath. Positive dysuria   Hospital Course:  Donna Mcdonnell Knightis an 60 y.o.femalewith hx of depression, morbid obesity, chronic  nausea for unclear reason, GERD, cervical spinal stenosis, functional quadriplegia, admitted with persistent hypokalemia, hypomagnesemia, and unable to take oral supplements. She has no abdominal pain, fever, or chills. She was admitted this time for persistent electrolyte abnormality, and UTI. She was started on IV Rocephin and supplementation of her K. She has a PICC line, and this was continued, and she was given some IVF. My thought was that many of her symptoms were side effects of her medication, and over the several days of her hospitalization, I have attempted to adjust and reduce her medication.  For her polypharmacy, her SSRI was discontinued.  She did not think it was helpful.  She is still depressed but finding a good antidepressant may be difficult.  I will defer to her PCP, but thought that perhaps Welbutrin would be a good choice, given no hx of seizure, and it would help with her weight loss.  I noted with her pain meds, she was a little lethargic, so codeine family group was discontinued, and Fentanyl patch was lowered.  Unfortunately, she did not want to lower her Duragesic patch at this time.  Because she has been on narcotic for sometime, a testosterone level was drawn, and it was normal.  Her thyroid medication was continued, and her TSH was checked and it was normal.  I suspect her edema and anasarca was from hypoalbumin, but I suspect she has some proteinuria, as her UA showed protein spill.  A 24 hour urine collection was obtained to  quantitate the amount of protein spill, and it was microalbumin, however, she has light chain protein and abnormal paraprotein found in her urine.  She will need to have this follow up and she and her sister were told of its importance.  With tapering her medication, she complained more of pain, but yet she is improving.  She has more alertness, and was able to eat.  Her nausea has improved.  Due to her known spinal stenosis, and her numerous chronic  problems, she was offered palliative care consultation, however, she would like to receive further tx and was encouraged to keep trying to get better.  One recommendation was to resume at her CPAP machine.  This will hopefully give her good rest, controlling her BP without more meds, and prevent more weight gain and pulmonary HTN.  For her UTI, she has finished her IV antibiotic course, and no longer need further antibiotics.  For her pain, perhaps pain clinic physician would be helpful. It was noted that with her low Mag and K, along with her other meds including SSRI, antiemetics, her QTc was prolonged.  With repletion of Mag and K, along with discontinuation of her meds which can cause prolongation of the QT, her QTc have improved from 696 milliseconds to 521 ms, a big improvement.   In the interim, I will give her Zostrix and encourage her to avoid using more narcotics.  She clearly has many complex problem, and I appreciate the opportunity to participate in her care.  Good  Day.   Discharge Exam: Vitals:   09/11/15 2128 09/12/15 0613  BP: (!) 92/58 98/60  Pulse: 97 97  Resp: 20 20  Temp: 97.9 F (36.6 C) 98 F (36.7 C)    Current Discharge Medication List    CONTINUE these medications which have NOT CHANGED   Details  acetaminophen (TYLENOL) 500 MG tablet Take 1,000 mg by mouth every 8 (eight) hours as needed.    ALPRAZolam (XANAX) 0.5 MG tablet Take 0.5 mg by mouth every 6 (six) hours as needed for anxiety. For anxiety.    Alum Hydroxide-Mag Carbonate (GAVISCON EXTRA STRENGTH) 160-105 MG CHEW Chew 2 tablets by mouth 4 (four) times daily.    aspirin EC 81 MG tablet Take 81 mg by mouth daily.    bisacodyl (DULCOLAX) 5 MG EC tablet Take 10 mg by mouth every morning.    calcium carbonate (TUMS - DOSED IN MG ELEMENTAL CALCIUM) 500 MG chewable tablet Chew 1 tablet by mouth 3 (three) times daily with meals.     esomeprazole (NEXIUM) 40 MG capsule Take 40 mg by mouth 2 (two) times daily  before a meal.    fentaNYL (DURAGESIC - DOSED MCG/HR) 100 MCG/HR Place 100 mcg onto the skin every 3 (three) days.    !! fentaNYL (DURAGESIC - DOSED MCG/HR) 50 MCG/HR Place 50 mcg onto the skin every 3 (three) days.    !! fentaNYL (DURAGESIC - DOSED MCG/HR) 50 MCG/HR Place 50 mcg onto the skin every 3 (three) days.    losartan (COZAAR) 25 MG tablet Take 25 mg by mouth daily.     magnesium oxide (MAG-OX) 400 MG tablet Take by mouth 2 (two) times daily.    naloxegol oxalate (MOVANTIK) 25 MG TABS tablet Take 25 mg by mouth daily.    nitroGLYCERIN (NITROSTAT) 0.4 MG SL tablet Place 0.4 mg under the tongue every 5 (five) minutes as needed for chest pain.    ondansetron (ZOFRAN-ODT) 8 MG disintegrating tablet Take 8  mg by mouth every 8 (eight) hours as needed for nausea or vomiting.    Oxycodone HCl 10 MG TABS Take 10 mg by mouth every 6 (six) hours as needed (for pain).    potassium chloride SA (K-DUR,KLOR-CON) 20 MEQ tablet Take 20 mEq by mouth 3 (three) times daily.     promethazine (PHENERGAN) 25 MG suppository Place 25 mg rectally every 8 (eight) hours as needed for nausea or vomiting.    promethazine (PHENERGAN) 25 MG tablet Take 25 mg by mouth every 8 (eight) hours as needed for nausea or vomiting.    promethazine (PHENERGAN) 25 MG/ML injection Inject 25 mg into the vein every 6 (six) hours as needed for nausea or vomiting.    ranitidine (ZANTAC) 75 MG tablet Take 75 mg by mouth 2 (two) times daily.    saccharomyces boulardii (FLORASTOR) 250 MG capsule Take 250 mg by mouth daily.    sertraline (ZOLOFT) 100 MG tablet Take 100 mg by mouth at bedtime.     torsemide (DEMADEX) 20 MG tablet Take 80 mg by mouth daily.    Vitamin D, Ergocalciferol, (DRISDOL) 50000 UNITS CAPS capsule Take 50,000 Units by mouth every 30 (thirty) days. Takes on the 20th of each month.    WHEY PROTEIN PO Take by mouth daily.     !! - Potential duplicate medications found. Please discuss with provider.      Allergies  Allergen Reactions  . Sulfa Antibiotics Itching and Swelling      The results of significant diagnostics from this hospitalization (including imaging, microbiology, ancillary and laboratory) are listed below for reference.    Significant Diagnostic Studies: Ct Abdomen Pelvis Wo Contrast  Result Date: 09/04/2015 CLINICAL DATA:  Nausea and vomiting for several months with abdominal pain, initial encounter EXAM: CT ABDOMEN AND PELVIS WITHOUT CONTRAST TECHNIQUE: Multidetector CT imaging of the abdomen and pelvis was performed following the standard protocol without IV contrast. COMPARISON:  01/05/2015 FINDINGS: Lower chest:  Stable sub cm nodules are again identified Hepatobiliary: Diffuse fatty infiltration of the liver is noted. The gallbladder has been surgically removed. No hepatic mass is seen. Pancreas: No mass or inflammatory process identified on this un-enhanced exam. Spleen: Within normal limits in size. Adrenals/Urinary Tract: The adrenal glands are within normal limits. Kidney show no obstructive changes. No calculi are seen. No focal renal masses are noted. The bladder is decompressed by Foley catheter. Stomach/Bowel: Fecal material is noted throughout the colon without obstructive change or significant constipation. The appendix is within normal limits. No significant diverticular change is identified. Vascular/Lymphatic: No pathologically enlarged lymph nodes. No evidence of abdominal aortic aneurysm. Reproductive: No mass or other significant abnormality. Other: None. Musculoskeletal:  No suspicious bone lesions identified. IMPRESSION: Stable sub cm lung nodules when compared with the prior examination. Non-contrast chest CT at 12-18 months (from today's scan) is considered optional for low-risk patients, but is recommended for high-risk patients. This recommendation follows the consensus statement: Guidelines for Management of Incidental Pulmonary Nodules Detected on CT  Images:From the Fleischner Society 2017; published online before print (10.1148/radiol.[id number]). Chronic changes without acute abnormality. Electronically Signed   By: Alcide Clever M.D.   On: 09/04/2015 14:17   Microbiology: Recent Results (from the past 240 hour(s))  Urine culture     Status: Abnormal   Collection Time: 09/07/15  7:14 PM  Result Value Ref Range Status   Specimen Description URINE, CATHETERIZED  Final   Special Requests NONE  Final   Culture MULTIPLE SPECIES PRESENT, SUGGEST  RECOLLECTION (A)  Final   Report Status 09/09/2015 FINAL  Final  MRSA PCR Screening     Status: Abnormal   Collection Time: 09/07/15 10:15 PM  Result Value Ref Range Status   MRSA by PCR RESULT CALLED TO, READ BACK BY AND VERIFIED WITH: (A) NEGATIVE Final    Comment: COVINGTON,L AT 0815 BY HUFFINES,S ON 09/08/15.        The GeneXpert MRSA Assay (FDA approved for NASAL specimens only), is one component of a comprehensive MRSA colonization surveillance program. It is not intended to diagnose MRSA infection nor to guide or monitor treatment for MRSA infections.      Labs: Basic Metabolic Panel:  Recent Labs Lab 09/07/15 1738 09/08/15 0438 09/09/15 0504 09/10/15 0538 09/11/15 0614  NA 131* 131* 134* 131* 132*  K 2.1* 2.5* 3.0* 3.3* 3.9  CL 89* 92* 95* 95* 98*  CO2 34* GLUCOSE 96 78 81 98 87  BUN CREATININE 0.43* 0.42* 0.41* 0.41* 0.39*  CALCIUM 6.6* 6.5* 6.7* 6.6* 6.6*  MG  --  1.6*  --   --  1.7   Liver Function Tests:  Recent Labs Lab 09/07/15 1738 09/08/15 0438 09/09/15 0504 09/11/15 0614  AST 57* 60* 69* 57*  ALT 32  ALKPHOS 102 105 104 114  BILITOT 1.5* 2.1* 1.6* 1.5*  PROT 5.7* 5.8* 5.4* 5.3*  ALBUMIN 2.0* 2.0* 1.8* 1.8*   CBC:  Recent Labs Lab 09/07/15 1738 09/08/15 0438  WBC 7.8 16.0*  NEUTROABS 4.5  --   HGB 12.3 12.0  HCT 34.3* 34.4*  MCV 100.9* 101.8*  PLT 289 307   Signed:  Yazir Koerber MD. Jerrel Ivory.  [facility] 09/12/2015, 2:05 PM

## 2015-09-12 NOTE — Progress Notes (Signed)
LCSW made aware of discharge. All clinicals sent on hub. Per weekday SW, patient can return to [facility] this weekend.  Unit to facilitate transportation.  Deretha Emory, MSW Clinical Social Work: [facility] [employer] [phone]

## 2015-09-14 LAB — FOLATE RBC
FOLATE, HEMOLYSATE: 196.3 ng/mL
FOLATE, RBC: 598 ng/mL (ref >498)
Hematocrit: 32.8 % — ABNORMAL LOW (ref 34.0–46.6)

## 2015-09-16 ENCOUNTER — Encounter (HOSPITAL_COMMUNITY): Payer: Self-pay

## 2015-09-16 ENCOUNTER — Emergency Department (HOSPITAL_COMMUNITY): Payer: Medicare Other

## 2015-09-16 ENCOUNTER — Inpatient Hospital Stay (HOSPITAL_COMMUNITY)
Admission: EM | Admit: 2015-09-16 | Discharge: 2015-09-23 | DRG: 689 | Disposition: A | Discharge status: Skilled Nursing Facility | Payer: Medicare Other | Attending: Internal Medicine | Admitting: Internal Medicine

## 2015-09-16 DIAGNOSIS — A419 Sepsis, unspecified organism: Secondary | ICD-10-CM | POA: Insufficient documentation

## 2015-09-16 DIAGNOSIS — M542 Cervicalgia: Secondary | ICD-10-CM | POA: Diagnosis present

## 2015-09-16 DIAGNOSIS — E871 Hypo-osmolality and hyponatremia: Secondary | ICD-10-CM | POA: Diagnosis present

## 2015-09-16 DIAGNOSIS — Z6841 Body Mass Index (BMI) 40.0 and over, adult: Secondary | ICD-10-CM | POA: Diagnosis not present

## 2015-09-16 DIAGNOSIS — E869 Volume depletion, unspecified: Secondary | ICD-10-CM | POA: Diagnosis present

## 2015-09-16 DIAGNOSIS — Z7189 Other specified counseling: Secondary | ICD-10-CM

## 2015-09-16 DIAGNOSIS — Z66 Do not resuscitate: Secondary | ICD-10-CM | POA: Diagnosis present

## 2015-09-16 DIAGNOSIS — R532 Functional quadriplegia: Secondary | ICD-10-CM | POA: Diagnosis present

## 2015-09-16 DIAGNOSIS — I10 Essential (primary) hypertension: Secondary | ICD-10-CM | POA: Diagnosis present

## 2015-09-16 DIAGNOSIS — G894 Chronic pain syndrome: Secondary | ICD-10-CM | POA: Diagnosis present

## 2015-09-16 DIAGNOSIS — F329 Major depressive disorder, single episode, unspecified: Secondary | ICD-10-CM | POA: Diagnosis present

## 2015-09-16 DIAGNOSIS — D638 Anemia in other chronic diseases classified elsewhere: Secondary | ICD-10-CM | POA: Diagnosis present

## 2015-09-16 DIAGNOSIS — K219 Gastro-esophageal reflux disease without esophagitis: Secondary | ICD-10-CM | POA: Diagnosis present

## 2015-09-16 DIAGNOSIS — R52 Pain, unspecified: Secondary | ICD-10-CM | POA: Diagnosis present

## 2015-09-16 DIAGNOSIS — R601 Generalized edema: Secondary | ICD-10-CM | POA: Diagnosis present

## 2015-09-16 DIAGNOSIS — D649 Anemia, unspecified: Secondary | ICD-10-CM | POA: Diagnosis present

## 2015-09-16 DIAGNOSIS — F419 Anxiety disorder, unspecified: Secondary | ICD-10-CM | POA: Diagnosis present

## 2015-09-16 DIAGNOSIS — E43 Unspecified severe protein-calorie malnutrition: Secondary | ICD-10-CM | POA: Diagnosis present

## 2015-09-16 DIAGNOSIS — G8929 Other chronic pain: Secondary | ICD-10-CM | POA: Diagnosis present

## 2015-09-16 DIAGNOSIS — I4581 Long QT syndrome: Secondary | ICD-10-CM

## 2015-09-16 DIAGNOSIS — E876 Hypokalemia: Secondary | ICD-10-CM | POA: Diagnosis present

## 2015-09-16 DIAGNOSIS — I959 Hypotension, unspecified: Secondary | ICD-10-CM | POA: Diagnosis present

## 2015-09-16 DIAGNOSIS — Z8249 Family history of ischemic heart disease and other diseases of the circulatory system: Secondary | ICD-10-CM | POA: Diagnosis not present

## 2015-09-16 DIAGNOSIS — Z825 Family history of asthma and other chronic lower respiratory diseases: Secondary | ICD-10-CM | POA: Diagnosis not present

## 2015-09-16 DIAGNOSIS — Z7401 Bed confinement status: Secondary | ICD-10-CM

## 2015-09-16 DIAGNOSIS — E872 Acidosis: Secondary | ICD-10-CM | POA: Diagnosis present

## 2015-09-16 DIAGNOSIS — M1710 Unilateral primary osteoarthritis, unspecified knee: Secondary | ICD-10-CM | POA: Diagnosis present

## 2015-09-16 DIAGNOSIS — Z515 Encounter for palliative care: Secondary | ICD-10-CM | POA: Diagnosis not present

## 2015-09-16 DIAGNOSIS — G4733 Obstructive sleep apnea (adult) (pediatric): Secondary | ICD-10-CM | POA: Diagnosis present

## 2015-09-16 DIAGNOSIS — N390 Urinary tract infection, site not specified: Secondary | ICD-10-CM | POA: Diagnosis present

## 2015-09-16 DIAGNOSIS — G629 Polyneuropathy, unspecified: Secondary | ICD-10-CM | POA: Diagnosis present

## 2015-09-16 DIAGNOSIS — E8770 Fluid overload, unspecified: Secondary | ICD-10-CM | POA: Diagnosis present

## 2015-09-16 DIAGNOSIS — R9431 Abnormal electrocardiogram [ECG] [EKG]: Secondary | ICD-10-CM | POA: Diagnosis present

## 2015-09-16 DIAGNOSIS — Z9049 Acquired absence of other specified parts of digestive tract: Secondary | ICD-10-CM

## 2015-09-16 DIAGNOSIS — R740 Nonspecific elevation of levels of transaminase and lactic acid dehydrogenase [LDH]: Secondary | ICD-10-CM

## 2015-09-16 DIAGNOSIS — R7402 Elevation of levels of lactic acid dehydrogenase (LDH): Secondary | ICD-10-CM

## 2015-09-16 LAB — URINALYSIS, ROUTINE W REFLEX MICROSCOPIC
Glucose, UA: NEGATIVE mg/dL
Ketones, ur: NEGATIVE mg/dL
NITRITE: POSITIVE — AB
Protein, ur: NEGATIVE mg/dL
SPECIFIC GRAVITY, URINE: 1.02 (ref 1.005–1.030)
pH: 5.5 (ref 5.0–8.0)

## 2015-09-16 LAB — CBC WITH DIFFERENTIAL/PLATELET
BASOS ABS: 0 10*3/uL (ref 0.0–0.1)
BASOS PCT: 0 %
EOS PCT: 0 %
Eosinophils Absolute: 0 10*3/uL (ref 0.0–0.7)
HEMATOCRIT: 27.7 % — AB (ref 36.0–46.0)
Hemoglobin: 10 g/dL — ABNORMAL LOW (ref 12.0–15.0)
Lymphocytes Relative: 17 %
Lymphs Abs: 1.8 10*3/uL (ref 0.7–4.0)
MCH: 37 pg — ABNORMAL HIGH (ref 26.0–34.0)
MCHC: 36.1 g/dL — ABNORMAL HIGH (ref 30.0–36.0)
MCV: 102.6 fL — AB (ref 78.0–100.0)
MONO ABS: 0.3 10*3/uL (ref 0.1–1.0)
MONOS PCT: 3 %
Neutro Abs: 8.9 10*3/uL — ABNORMAL HIGH (ref 1.7–7.7)
Neutrophils Relative %: 80 %
PLATELETS: 138 10*3/uL — AB (ref 150–400)
RBC: 2.7 MIL/uL — ABNORMAL LOW (ref 3.87–5.11)
RDW: 22.1 % — AB (ref 11.5–15.5)
WBC: 11 10*3/uL — ABNORMAL HIGH (ref 4.0–10.5)

## 2015-09-16 LAB — TROPONIN I: Troponin I: <0.03 ng/mL (ref <0.03)

## 2015-09-16 LAB — RETICULOCYTES
RBC.: 2.68 MIL/uL — AB (ref 3.87–5.11)
RETIC COUNT ABSOLUTE: 59 10*3/uL (ref 19.0–186.0)
Retic Ct Pct: 2.2 % (ref 0.4–3.1)

## 2015-09-16 LAB — PROCALCITONIN: Procalcitonin: 17.59 ng/mL

## 2015-09-16 LAB — URINE MICROSCOPIC-ADD ON

## 2015-09-16 LAB — COMPREHENSIVE METABOLIC PANEL
ALBUMIN: 1.7 g/dL — AB (ref 3.5–5.0)
ALT: 50 U/L (ref 14–54)
AST: 81 U/L — AB (ref 15–41)
Alkaline Phosphatase: 115 U/L (ref 38–126)
Anion gap: 9 (ref 5–15)
BUN: 14 mg/dL (ref 6–20)
CHLORIDE: 93 mmol/L — AB (ref 101–111)
CO2: 24 mmol/L (ref 22–32)
Calcium: 6.9 mg/dL — ABNORMAL LOW (ref 8.9–10.3)
Creatinine, Ser: 0.53 mg/dL (ref 0.44–1.00)
GFR calc Af Amer: >60 mL/min (ref >60)
GLUCOSE: 96 mg/dL (ref 65–99)
POTASSIUM: 3.1 mmol/L — AB (ref 3.5–5.1)
SODIUM: 126 mmol/L — AB (ref 135–145)
Total Bilirubin: 3.1 mg/dL — ABNORMAL HIGH (ref 0.3–1.2)
Total Protein: 5.2 g/dL — ABNORMAL LOW (ref 6.5–8.1)

## 2015-09-16 LAB — I-STAT CG4 LACTIC ACID, ED: LACTIC ACID, VENOUS: 5.38 mmol/L — AB (ref 0.5–1.9)

## 2015-09-16 LAB — LIPASE, BLOOD: Lipase: 12 U/L (ref 11–51)

## 2015-09-16 LAB — MAGNESIUM: MAGNESIUM: 1.1 mg/dL — AB (ref 1.7–2.4)

## 2015-09-16 LAB — D-DIMER, QUANTITATIVE (NOT AT ARMC)

## 2015-09-16 LAB — LACTIC ACID, PLASMA: LACTIC ACID, VENOUS: 5.1 mmol/L — AB (ref 0.5–1.9)

## 2015-09-16 MED ORDER — PRO-STAT SUGAR FREE PO LIQD
30.0000 mL | Freq: Two times a day (BID) | ORAL | Status: DC
  Administered 2015-09-17 – 2015-09-23 (×12): 30 mL via ORAL
  Filled 2015-09-16 (×11): qty 30

## 2015-09-16 MED ORDER — FENTANYL 50 MCG/HR TD PT72: 50.0000 ug | MEDICATED_PATCH | TRANSDERMAL | Status: DC

## 2015-09-16 MED ORDER — NYSTATIN 100000 UNIT/ML MT SUSP
5.0000 mL | Freq: Four times a day (QID) | OROMUCOSAL | Status: DC
  Administered 2015-09-17 – 2015-09-23 (×23): 500000 [IU] via ORAL
  Filled 2015-09-16 (×21): qty 5

## 2015-09-16 MED ORDER — FENTANYL 100 MCG/HR TD PT72: 100.0000 ug | MEDICATED_PATCH | TRANSDERMAL | Status: DC

## 2015-09-16 MED ORDER — NORMAL SALINE FLUSH 0.9 % IV SOLN: 10.0000 mL | Freq: Three times a day (TID) | INTRAVENOUS | Status: DC

## 2015-09-16 MED ORDER — SODIUM CHLORIDE 0.9 % IV SOLN: INTRAVENOUS | Status: DC

## 2015-09-16 MED ORDER — PROMETHAZINE HCL 12.5 MG PO TABS
25.0000 mg | ORAL_TABLET | ORAL | Status: DC | PRN
  Administered 2015-09-20: 25 mg via ORAL
  Filled 2015-09-16 (×3): qty 2

## 2015-09-16 MED ORDER — LOSARTAN POTASSIUM 50 MG PO TABS
25.0000 mg | ORAL_TABLET | Freq: Every day | ORAL | Status: DC
  Administered 2015-09-17: 25 mg via ORAL
  Filled 2015-09-16 (×2): qty 1

## 2015-09-16 MED ORDER — FLUCONAZOLE 100 MG PO TABS
100.0000 mg | ORAL_TABLET | Freq: Once | ORAL | Status: AC
  Administered 2015-09-16: 100 mg via ORAL
  Filled 2015-09-16: qty 1

## 2015-09-16 MED ORDER — SODIUM CHLORIDE 0.9 % IV BOLUS (SEPSIS)
1000.0000 mL | Freq: Once | INTRAVENOUS | Status: DC
  Administered 2015-09-16: 1000 mL via INTRAVENOUS

## 2015-09-16 MED ORDER — BISACODYL 5 MG PO TBEC
10.0000 mg | DELAYED_RELEASE_TABLET | Freq: Every morning | ORAL | Status: DC
  Administered 2015-09-17 – 2015-09-23 (×7): 10 mg via ORAL
  Filled 2015-09-16 (×7): qty 2

## 2015-09-16 MED ORDER — PROMETHAZINE HCL 25 MG RE SUPP: 25.0000 mg | RECTAL | Status: DC | PRN

## 2015-09-16 MED ORDER — MAGNESIUM SULFATE 2 GM/50ML IV SOLN
2.0000 g | Freq: Once | INTRAVENOUS | Status: AC
  Administered 2015-09-16: 2 g via INTRAVENOUS
  Filled 2015-09-16: qty 50

## 2015-09-16 MED ORDER — SERTRALINE HCL 50 MG PO TABS
100.0000 mg | ORAL_TABLET | Freq: Every day | ORAL | Status: DC
  Administered 2015-09-17 – 2015-09-22 (×5): 100 mg via ORAL
  Filled 2015-09-16 (×7): qty 2

## 2015-09-16 MED ORDER — MAGNESIUM OXIDE 400 MG PO TABS: 400.0000 mg | ORAL_TABLET | Freq: Two times a day (BID) | ORAL | Status: DC

## 2015-09-16 MED ORDER — NITROGLYCERIN 0.4 MG SL SUBL
0.4000 mg | SUBLINGUAL_TABLET | SUBLINGUAL | Status: DC | PRN
Start: 2015-09-16 — End: 2015-09-23

## 2015-09-16 MED ORDER — CALCIUM CARBONATE ANTACID 500 MG PO CHEW
1.0000 | CHEWABLE_TABLET | Freq: Three times a day (TID) | ORAL | Status: DC
  Administered 2015-09-17 – 2015-09-23 (×18): 200 mg via ORAL
  Filled 2015-09-16 (×16): qty 1

## 2015-09-16 MED ORDER — ENOXAPARIN SODIUM 40 MG/0.4ML ~~LOC~~ SOLN
40.0000 mg | Freq: Every day | SUBCUTANEOUS | Status: DC
  Administered 2015-09-17: 40 mg via SUBCUTANEOUS
  Filled 2015-09-16: qty 0.4

## 2015-09-16 MED ORDER — BOOST / RESOURCE BREEZE PO LIQD
1.0000 | ORAL | Status: DC
  Administered 2015-09-17: 1 via ORAL
  Administered 2015-09-18: 11:00:00 via ORAL
  Administered 2015-09-19 – 2015-09-23 (×5): 1 via ORAL

## 2015-09-16 MED ORDER — SACCHAROMYCES BOULARDII 250 MG PO CAPS: 250.0000 mg | ORAL_CAPSULE | Freq: Every day | ORAL | Status: DC

## 2015-09-16 MED ORDER — ASPIRIN EC 81 MG PO TBEC
81.0000 mg | DELAYED_RELEASE_TABLET | Freq: Every day | ORAL | Status: DC
  Administered 2015-09-17 – 2015-09-23 (×7): 81 mg via ORAL
  Filled 2015-09-16 (×7): qty 1

## 2015-09-16 MED ORDER — MORPHINE SULFATE (PF) 4 MG/ML IV SOLN
4.0000 mg | Freq: Once | INTRAVENOUS | Status: AC
  Administered 2015-09-16: 4 mg via INTRAVENOUS
  Filled 2015-09-16: qty 1

## 2015-09-16 MED ORDER — MUPIROCIN 2 % EX OINT
1.0000 | TOPICAL_OINTMENT | Freq: Two times a day (BID) | CUTANEOUS | Status: DC
Start: 2015-09-17 — End: 2015-09-23
  Administered 2015-09-17 – 2015-09-23 (×13): 1 via NASAL
  Filled 2015-09-16: qty 22

## 2015-09-16 MED ORDER — ALPRAZOLAM 0.5 MG PO TABS
0.5000 mg | ORAL_TABLET | Freq: Four times a day (QID) | ORAL | Status: DC | PRN
  Administered 2015-09-17 – 2015-09-23 (×12): 0.5 mg via ORAL
  Filled 2015-09-16 (×12): qty 1

## 2015-09-16 MED ORDER — HEPARIN SOD (PORK) LOCK FLUSH 10 UNIT/ML IV SOLN: 10.0000 [IU] | Freq: Three times a day (TID) | INTRAVENOUS | Status: DC

## 2015-09-16 MED ORDER — POTASSIUM CHLORIDE CRYS ER 20 MEQ PO TBCR
20.0000 meq | EXTENDED_RELEASE_TABLET | Freq: Three times a day (TID) | ORAL | Status: DC
  Administered 2015-09-17 – 2015-09-22 (×17): 20 meq via ORAL
  Filled 2015-09-16 (×17): qty 1

## 2015-09-16 MED ORDER — SODIUM CHLORIDE 0.9 % IV BOLUS (SEPSIS)
1000.0000 mL | Freq: Once | INTRAVENOUS | Status: AC
  Administered 2015-09-16: 1000 mL via INTRAVENOUS

## 2015-09-16 MED ORDER — OXYCODONE HCL 10 MG PO TABS: 10.0000 mg | ORAL_TABLET | Freq: Four times a day (QID) | ORAL | Status: DC | PRN

## 2015-09-16 MED ORDER — ALUM HYDROXIDE-MAG CARBONATE 160-105 MG PO CHEW
2.0000 | CHEWABLE_TABLET | Freq: Four times a day (QID) | ORAL | Status: DC | PRN
  Filled 2015-09-16: qty 2

## 2015-09-16 MED ORDER — FENTANYL CITRATE (PF) 100 MCG/2ML IJ SOLN
25.0000 ug | INTRAMUSCULAR | Status: DC | PRN
  Administered 2015-09-17 – 2015-09-19 (×3): 25 ug via INTRAVENOUS
  Filled 2015-09-16 (×3): qty 2

## 2015-09-16 MED ORDER — POTASSIUM CHLORIDE IN NACL 20-0.9 MEQ/L-% IV SOLN
INTRAVENOUS | Status: AC
Start: 2015-09-17 — End: 2015-09-17
  Administered 2015-09-17: 01:00:00 via INTRAVENOUS

## 2015-09-16 MED ORDER — VITAMIN D (ERGOCALCIFEROL) 1.25 MG (50000 UNIT) PO CAPS: 50000.0000 [IU] | ORAL_CAPSULE | ORAL | Status: DC

## 2015-09-16 MED ORDER — POTASSIUM CHLORIDE 10 MEQ/100ML IV SOLN
10.0000 meq | INTRAVENOUS | Status: AC
  Administered 2015-09-16 – 2015-09-17 (×2): 10 meq via INTRAVENOUS
  Filled 2015-09-16 (×3): qty 100

## 2015-09-16 MED ORDER — SODIUM CHLORIDE 0.9 % IV BOLUS (SEPSIS): 1000.0000 mL | Freq: Once | INTRAVENOUS | Status: DC

## 2015-09-16 MED ORDER — DEXTROSE 5 % IV SOLN
1.0000 g | Freq: Once | INTRAVENOUS | Status: AC
  Administered 2015-09-16: 1 g via INTRAVENOUS
  Filled 2015-09-16: qty 10

## 2015-09-16 MED ORDER — DEXTROSE 5 % IV SOLN
2.0000 g | INTRAVENOUS | Status: DC
  Administered 2015-09-18 – 2015-09-20 (×3): 2 g via INTRAVENOUS
  Filled 2015-09-16 (×5): qty 2

## 2015-09-16 NOTE — ED Provider Notes (Signed)
[facility]-EM[facility]Y DEPT Provider Note   CSN: [id number] Arrival date & time: 09/16/15  7829  First Provider Contact:  First MD Initiated Contact with Patient 09/16/15 1855        History   Chief Complaint Chief Complaint  Patient presents with  . Pain    HPI [name] is a 60 y.o. female.  Patient from nursing facility with "pain all over". Patient is a bedbound morbidly obese female who was discharged from the hospital 4 days ago after stay for intractable nausea and vomiting, hypokalemia and UTI. She completed a course of antibiotics. Her Foley catheter was changed. She reports having a fever 2 nights ago to 101. She still has frequent episodes of nausea and vomiting which has been an ongoing issue for her for the past one month. She had a CT scan in July that was negative as well as an EGD. Reports her vaginal patch was reduced while she was in the hospital to 100 g. She was not happy with this and this was increased to 150 g when she returned to her nursing home. She reports is ineffective. And describes pain underneath her left breast, upper back, bilateral hands. Denies any falls or trauma. Her oxycodone pain medication was also stopped. She states nursing home ordered her morphine which she has not been able to get pharmacy issues. Normal bowel movement yesterday. States she has left-sided chest pain with deep breathing and coughing.   The history is provided by the patient and the EMS personnel.    Past Medical History:  Diagnosis Date  . Anxiety   . Depression   . DVT (deep venous thrombosis) (HCC)   . Facet syndrome, lumbar   . GERD (gastroesophageal reflux disease)   . Hypertension   . Lumbago   . 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 Obesity   . Primary localized osteoarthrosis, lower leg   . Thoracic or lumbosacral neuritis or radiculitis, unspecified    parapersis    Patient Active Problem List   Diagnosis Date Noted  .  Pressure ulcer 09/10/2015  . Obstructive sleep apnea 09/09/2015  . Hypokalemia 09/07/2015  . Anemia 05/28/2013  . GERD (gastroesophageal reflux disease) 05/27/2013  . Laryngopharyngeal reflux (LPR) 05/27/2013  . Constipation 02/19/2012  . Syncope and collapse 02/19/2012  . Chest pain 02/19/2012  . Knee osteoarthritis 05/03/2011  . Rotator cuff syndrome of right shoulder 05/03/2011  . Lumbar facet arthropathy 05/03/2011  . Morbid obesity (HCC) 05/03/2011    Past Surgical History:  Procedure Laterality Date  . ABDOMINAL HYSTERECTOMY    . CESAREAN SECTION     x2  . CHOLECYSTECTOMY      OB History    No data available       Home Medications    Prior to Admission medications   Medication Sig Start Date End Date Taking? Authorizing Provider  Amino Acids-Protein Hydrolys (FEEDING SUPPLEMENT, PRO-STAT SUGAR FREE 64,) LIQD Take 30 mLs by mouth 2 (two) times daily.   Yes Historical Provider, MD  aspirin EC 81 MG tablet Take 81 mg by mouth daily.   Yes Historical Provider, MD  esomeprazole (NEXIUM) 40 MG capsule Take 40 mg by mouth 2 (two) times daily before a meal.   Yes Historical Provider, MD  Oxycodone HCl 10 MG TABS Take 10 mg by mouth every 6 (six) hours as needed (pain).   Yes Historical Provider, MD  acetaminophen (TYLENOL) 500 MG tablet Take 1,000 mg by  mouth every 8 (eight) hours as needed.    Historical Provider, MD  bisacodyl (DULCOLAX) 5 MG EC tablet Take 10 mg by mouth every morning.    Historical Provider, MD  calcium carbonate (TUMS - DOSED IN MG ELEMENTAL CALCIUM) 500 MG chewable tablet Chew 1 tablet by mouth 3 (three) times daily with meals.     Historical Provider, MD  feeding supplement (BOOST / RESOURCE BREEZE) LIQD Take 1 Container by mouth daily. 09/12/15   Houston Siren, MD  fentaNYL (DURAGESIC - DOSED MCG/HR) 100 MCG/HR Place 100 mcg onto the skin every 3 (three) days.    Historical Provider, MD  fentaNYL (DURAGESIC - DOSED MCG/HR) 50 MCG/HR Place 50 mcg onto the skin  every 3 (three) days.    Historical Provider, MD  losartan (COZAAR) 25 MG tablet Take 25 mg by mouth daily.     Historical Provider, MD  magnesium oxide (MAG-OX) 400 MG tablet Take by mouth 2 (two) times daily.    Historical Provider, MD  mupirocin ointment (BACTROBAN) 2 % Place 1 application into the nose 2 (two) times daily. 09/12/15   Houston Siren, MD  nystatin (MYCOSTATIN) 100000 UNIT/ML suspension Take 5 mLs (500,000 Units total) by mouth 4 (four) times daily. 09/12/15   Houston Siren, MD  potassium chloride SA (K-DUR,KLOR-CON) 20 MEQ tablet Take 20 mEq by mouth 3 (three) times daily.     Historical Provider, MD  saccharomyces boulardii (FLORASTOR) 250 MG capsule Take 250 mg by mouth daily.    Historical Provider, MD    Family History Family History  Problem Relation Age of Onset  . Asthma Mother   . COPD Mother   . Heart disease Mother   . Heart disease Father   . Colon cancer Neg Hx     Social History Social History  Substance Use Topics  . Smoking status: Never Smoker  . Smokeless tobacco: Never Used  . Alcohol use No     Allergies   Sulfa antibiotics   Review of Systems Review of Systems  Constitutional: Positive for activity change, appetite change, chills, fatigue and fever.  HENT: Negative for congestion and rhinorrhea.   Respiratory: Positive for chest tightness. Negative for cough and shortness of breath.   Cardiovascular: Positive for chest pain.  Gastrointestinal: Positive for abdominal pain, nausea and vomiting.  Genitourinary: Negative for dysuria, vaginal bleeding and vaginal discharge.  Musculoskeletal: Positive for arthralgias, back pain and myalgias.  Skin: Negative for rash.  Neurological: Negative for dizziness, weakness and headaches.   A complete 10 system review of systems was obtained and all systems are negative except as noted in the HPI and PMH.    Physical Exam Updated Vital Signs BP 119/61 (BP Location: Left Wrist)   Pulse 107   Temp 98.8 F  (37.1 C) (Oral)   Resp 19   Ht  (1.702 m)   Wt (!) 350 lb (158.8 kg)   SpO2 99%   BMI 54.82 kg/m   Physical Exam  Constitutional: She is oriented to person, place, and time. She appears well-developed and well-nourished. No distress.  Morbidly obese. Pale appearing.   HENT:  Head: Normocephalic and atraumatic.  Left Ear: External ear normal.  Mouth/Throat: No oropharyngeal exudate.  Dry mucus membranes  Eyes: Conjunctivae and EOM are normal. Pupils are equal, round, and reactive to light.  Neck: Normal range of motion. Neck supple.  No meningismus.  Cardiovascular: Normal rate, regular rhythm, normal heart sounds and intact distal pulses.   No  murmur heard. Pulmonary/Chest: Effort normal and breath sounds normal. No respiratory distress.  Abdominal: Soft. There is no tenderness. There is no rebound and no guarding.  Fentanyl patch in place  Genitourinary:  Genitourinary Comments: Foley in place with dark urine  Musculoskeletal: Normal range of motion. She exhibits no edema or tenderness.  Neurological: She is alert and oriented to person, place, and time. No cranial nerve deficit. She exhibits normal muscle tone. Coordination normal.  Equal grip strengths. Unable to move legs which is baseline  Skin: Skin is warm.  Psychiatric: She has a normal mood and affect. Her behavior is normal.  Nursing note and vitals reviewed.    ED Treatments / Results  Labs (all labs ordered are listed, but only abnormal results are displayed) Labs Reviewed  CBC WITH DIFFERENTIAL/PLATELET - Abnormal; Notable for the following:       Result Value   WBC 11.0 (*)    RBC 2.70 (*)    Hemoglobin 10.0 (*)    HCT 27.7 (*)    MCV 102.6 (*)    MCH 37.0 (*)    MCHC 36.1 (*)    RDW 22.1 (*)    Platelets 138 (*)    Neutro Abs 8.9 (*)    All other components within normal limits  COMPREHENSIVE METABOLIC PANEL - Abnormal; Notable for the following:    Sodium 126 (*)    Potassium 3.1 (*)     Chloride 93 (*)    Calcium 6.9 (*)    Total Protein 5.2 (*)    Albumin 1.7 (*)    AST 81 (*)    Total Bilirubin 3.1 (*)    All other components within normal limits  MAGNESIUM - Abnormal; Notable for the following:    Magnesium 1.1 (*)    All other components within normal limits  URINALYSIS, ROUTINE W REFLEX MICROSCOPIC (NOT AT [facility]) - Abnormal; Notable for the following:    Color, Urine ORANGE (*)    Hgb urine dipstick LARGE (*)    Bilirubin Urine MODERATE (*)    Nitrite POSITIVE (*)    Leukocytes, UA MODERATE (*)    All other components within normal limits  URINE MICROSCOPIC-ADD ON - Abnormal; Notable for the following:    Squamous Epithelial / LPF 0-5 (*)    Bacteria, UA MANY (*)    Crystals CA OXALATE CRYSTALS (*)    All other components within normal limits  RETICULOCYTES - Abnormal; Notable for the following:    RBC. 2.68 (*)    All other components within normal limits  LACTIC ACID, PLASMA - Abnormal; Notable for the following:    Lactic Acid, Venous 5.1 (*)    All other components within normal limits  LACTIC ACID, PLASMA - Abnormal; Notable for the following:    Lactic Acid, Venous 3.8 (*)    All other components within normal limits  MAGNESIUM - Abnormal; Notable for the following:    Magnesium 1.5 (*)    All other components within normal limits  I-STAT CG4 LACTIC ACID, ED - Abnormal; Notable for the following:    Lactic Acid, Venous 5.38 (*)    All other components within normal limits  CULTURE, BLOOD (ROUTINE X 2)  CULTURE, BLOOD (ROUTINE X 2)  URINE CULTURE  LIPASE, BLOOD  TROPONIN I  D-DIMER, QUANTITATIVE (NOT AT [facility])  PROCALCITONIN  VITAMIN B12  FOLATE  IRON AND TIBC  FERRITIN  BASIC METABOLIC PANEL  CBC  POC OCCULT BLOOD, ED  TYPE AND SCREEN  EKG  EKG Interpretation  Date/Time:  Wednesday September 16 2015 19:13:20 EDT Ventricular Rate:  107 PR Interval:    QRS Duration: 83 QT Interval:  413 QTC Calculation: 552 R Axis:   62 Text  Interpretation:  Sinus tachycardia Low voltage, precordial leads Nonspecific T abnrm, anterolateral leads Prolonged QT interval No significant change was found Confirmed by Manus Gunning  MD, Maanasa Aderhold [phone]) on 09/16/2015 7:31:40 PM       Radiology Dg Chest Portable 1 View  Result Date: 09/16/2015 CLINICAL DATA:  Chest pain.  Pain over entire body. EXAM: PORTABLE CHEST 1 VIEW COMPARISON:  One-view chest x-ray 02/19/2012. CT of the chest 01/21/2015. FINDINGS: Heart size is normal. Chronic elevation of the right hemidiaphragm is again noted. Chronic interstitial coarsening is again noted. No focal airspace consolidation is present. A right-sided PICC line terminates at the cavoatrial junction. IMPRESSION: 1. No acute cardiopulmonary disease or significant interval change. 2. Chronic elevation of right hemidiaphragm. 3. Right-sided PICC line. Electronically Signed   By: Marin Roberts M.D.   On: 09/16/2015 19:41    Procedures Procedures (including critical care time)  Medications Ordered in ED Medications  morphine 4 MG/ML injection 4 mg (not administered)  sodium chloride 0.9 % bolus 1,000 mL (not administered)     Initial Impression / Assessment and Plan / ED Course  I have reviewed the triage vital signs and the nursing notes.  Pertinent labs & imaging results that were available during my care of the patient were reviewed by me and considered in my medical decision making (see chart for details).  Clinical Course  Patient with ongoing nausea, vomiting, worsening chronic pain. L sided chest pain under breast.  Labs show hyponatremia. Hypomagnesiumemia. D-dimer negative CXR stable. UA infected with elevated lactate.  BP normal.  Will not give full fluid bolus given patient's size.  May have early sepsis from UTI.  Replace, K, Mag, Continue IVF, rocephin given. Blood and urine cultures sent.  Last urine culture without clear isolate. Hemoglobin has also dropped 2 grams in 4 days.   FOBT sent, stool brown, type and screen sent.  Admission d/w Dr. Onalee Hua.  CRITICAL CARE Performed by: Glynn Octave Total critical care time: Critical care time was exclusive of separately billable procedures and treating other patients. Critical care was necessary to treat or prevent imminent or life-threatening deterioration. Critical care was time spent personally by me on the following activities: development of treatment plan with patient and/or surrogate as well as nursing, discussions with consultants, evaluation of patient's response to treatment, examination of patient, obtaining history from patient or surrogate, ordering and performing treatments and interventions, ordering and review of laboratory studies, ordering and review of radiographic studies, pulse oximetry and re-evaluation of patient's condition.  Final Clinical Impressions(s) / ED Diagnoses   Final diagnoses:  Sepsis, due to unspecified organism (HCC)  Elevated serum lactate dehydrogenase  Hypomagnesemia  Urinary tract infection without hematuria, site unspecified    New Prescriptions New Prescriptions   No medications on file     Glynn Octave, MD 09/17/15 0159

## 2015-09-16 NOTE — ED Triage Notes (Signed)
Pt reports that was in hospital 4 days ago and Fentanyl patch 150 mcg was discontinued. Now her pain is excrutiating all over her body. [facility]

## 2015-09-16 NOTE — H&P (Signed)
History and Physical    [name] ZOX:[id number] DOB: 10-Sep-1955 DOA: 09/16/2015  PCP: Josue Hector, MD  Patient coming from: SNF  Chief Complaint:  Pain all over  HPI: [name] is a 60 y.o. female with medical history significant of morbid obesity, bed bound, chronic indwelling foley, chronic pain medication, DVT, anxiety, depression comes in with "pain all over".  Pt says she was discharged last week and her pain medications were decreased, and she has been miserable since.  Her fentanyl patch was decreased to q 72 hours as per dr le's notes she was sedated while in the hospital.  Some of her oral oxycodone was also decreased.  She went back to the SNF and has been vomiting for the past week, having abdominal cramps and "feeling awful all over" pain very uncontrolled.  Her fentanyl patch was increased back to on Sunday (3 days ago) and she has been getting prn morphine and she says this is still not helping.  She denies any fevers.  No diarrhea.  Has not been eating or drinking very well since the adjustment in her pain meds.  Pt found to have a uti and increased lactic acid level in the ED and referred for admission for that along with low mag and k levels.   Review of Systems: As per HPI otherwise 10 point review of systems negative.   Past Medical History:  Diagnosis Date  . Anxiety   . Depression   . DVT (deep venous thrombosis) (HCC)   . Facet syndrome, lumbar   . GERD (gastroesophageal reflux disease)   . Hypertension   . Lumbago   . 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 Obesity   . Primary localized osteoarthrosis, lower leg   . Thoracic or lumbosacral neuritis or radiculitis, unspecified    parapersis    Past Surgical History:  Procedure Laterality Date  . ABDOMINAL HYSTERECTOMY    . CESAREAN SECTION     x2  . CHOLECYSTECTOMY       reports that she has never smoked. She has never used smokeless tobacco.  She reports that she does not drink alcohol or use drugs.  Allergies  Allergen Reactions  . Sulfa Antibiotics Itching and Swelling    Family History  Problem Relation Age of Onset  . Asthma Mother   . COPD Mother   . Heart disease Mother   . Heart disease Father   . Colon cancer Neg Hx     Prior to Admission medications   Medication Sig Start Date End Date Taking? Authorizing Provider  acetaminophen (TYLENOL) 500 MG tablet Take 1,000 mg by mouth every 8 (eight) hours as needed.   Yes Historical Provider, MD  ALPRAZolam Prudy Feeler) 0.5 MG tablet Take 0.5 mg by mouth every 6 (six) hours as needed for anxiety.   Yes Historical Provider, MD  Alum Hydroxide-Mag Carbonate (GAVISCON EXTRA STRENGTH) 160-105 MG CHEW Chew 2 tablets by mouth 4 (four) times daily as needed (acid reflux).   Yes Historical Provider, MD  Amino Acids-Protein Hydrolys (FEEDING SUPPLEMENT, PRO-STAT SUGAR FREE 64,) LIQD Take 30 mLs by mouth 2 (two) times daily.   Yes Historical Provider, MD  aspirin EC 81 MG tablet Take 81 mg by mouth daily.   Yes Historical Provider, MD  bisacodyl (DULCOLAX) 5 MG EC tablet Take 10 mg by mouth every morning.   Yes Historical Provider, MD  calcium carbonate (TUMS - DOSED IN MG  ELEMENTAL CALCIUM) 500 MG chewable tablet Chew 1 tablet by mouth 3 (three) times daily with meals.    Yes Historical Provider, MD  esomeprazole (NEXIUM) 40 MG capsule Take 40 mg by mouth 2 (two) times daily before a meal.   Yes Historical Provider, MD  feeding supplement (BOOST / RESOURCE BREEZE) LIQD Take 1 Container by mouth daily. 09/12/15  Yes Houston SirenPeter Le, MD  fentaNYL (DURAGESIC - DOSED MCG/HR) 100 MCG/HR Place 100 mcg onto the skin every 3 (three) days.   Yes Historical Provider, MD  fentaNYL (DURAGESIC - DOSED MCG/HR) 50 MCG/HR Place 50 mcg onto the skin every 3 (three) days.   Yes Historical Provider, MD  heparin flush 10 UNIT/ML SOLN injection Inject 10 Units into the vein 3 (three) times daily.   Yes Historical  Provider, MD  losartan (COZAAR) 25 MG tablet Take 25 mg by mouth daily.    Yes Historical Provider, MD  magnesium oxide (MAG-OX) 400 MG tablet Take 400 mg by mouth 2 (two) times daily.    Yes Historical Provider, MD  mupirocin ointment (BACTROBAN) 2 % Place 1 application into the nose 2 (two) times daily. 09/12/15  Yes Houston SirenPeter Le, MD  naloxegol oxalate (MOVANTIK) 25 MG TABS tablet Take 25 mg by mouth daily.   Yes Historical Provider, MD  nitroGLYCERIN (NITROSTAT) 0.4 MG SL tablet Place 0.4 mg under the tongue every 5 (five) minutes as needed for chest pain.   Yes Historical Provider, MD  nystatin (MYCOSTATIN) 100000 UNIT/ML suspension Take 5 mLs (500,000 Units total) by mouth 4 (four) times daily. 09/12/15  Yes Houston SirenPeter Le, MD  ondansetron (ZOFRAN-ODT) 8 MG disintegrating tablet Take 8 mg by mouth every 8 (eight) hours as needed for nausea or vomiting.   Yes Historical Provider, MD  Oxycodone HCl 10 MG TABS Take 10 mg by mouth every 6 (six) hours as needed (pain).   Yes Historical Provider, MD  potassium chloride (K-DUR,KLOR-CON) 10 MEQ tablet Take 20 mEq by mouth 3 (three) times daily.   Yes Historical Provider, MD  promethazine (PHENERGAN) 25 MG suppository Place 1 suppository rectally every 4 (four) hours as needed for nausea or vomiting.  07/08/15  Yes Historical Provider, MD  promethazine (PHENERGAN) 25 MG tablet Take 1 tablet by mouth every 4 (four) hours as needed for nausea or vomiting.  07/28/15  Yes Historical Provider, MD  ranitidine (ZANTAC) 75 MG tablet Take 75 mg by mouth 2 (two) times daily.   Yes Historical Provider, MD  sertraline (ZOLOFT) 100 MG tablet Take 100 mg by mouth at bedtime.   Yes Historical Provider, MD  Sodium Chloride Flush (NORMAL SALINE FLUSH) 0.9 % SOLN Inject 10 mLs into the vein 3 (three) times daily.   Yes Historical Provider, MD  torsemide (DEMADEX) 20 MG tablet Take 80 mg by mouth daily.   Yes Historical Provider, MD  Vitamin D, Ergocalciferol, (DRISDOL) 50000 units CAPS  capsule Take 50,000 Units by mouth every 30 (thirty) days. Takes on the 20th of every month.   Yes Historical Provider, MD  saccharomyces boulardii (FLORASTOR) 250 MG capsule Take 250 mg by mouth daily.    Historical Provider, MD    Physical Exam: Vitals:   09/16/15 1853 09/16/15 2030 09/16/15 2101 09/16/15 2144  BP: 119/61 (!) 115/48 (!) 108/48 99/64  Pulse: 107  107 104  Resp: 19 15 18 20   Temp: 98.8 F (37.1 C)     TempSrc: Oral     SpO2: 99%  98% 98%  Weight: Marland Kitchen(!)  158.8 kg (350 lb)     Height: 5\' 7"  (1.702 m)         Constitutional: NAD, calm, comfortable Vitals:   09/16/15 1853 09/16/15 2030 09/16/15 2101 09/16/15 2144  BP: 119/61 (!) 115/48 (!) 108/48 99/64  Pulse: 107  107 104  Resp: 19 15 18 20   Temp: 98.8 F (37.1 C)     TempSrc: Oral     SpO2: 99%  98% 98%  Weight: (!) 158.8 kg (350 lb)     Height: 5\' 7"  (1.702 m)      Eyes: PERRL, lids and conjunctivae normal ENMT: Mucous membranes are moist. Posterior pharynx clear of any exudate or lesions.Normal dentition.  Neck: normal, supple, no masses, no thyromegaly Respiratory: clear to auscultation bilaterally, no wheezing, no crackles. Normal respiratory effort. No accessory muscle use.  Cardiovascular: Regular rate and rhythm, no murmurs / rubs / gallops. No extremity edema. 2+ pedal pulses. No carotid bruits.  Abdomen: no tenderness, no masses palpated. No hepatosplenomegaly. Bowel sounds positive.  Musculoskeletal: no clubbing / cyanosis. No joint deformity upper and lower extremities. Good ROM, no contractures. Normal muscle tone.  Skin: no rashes, lesions, ulcers. No induration Neurologic: CN 2-12 grossly intact. Sensation intact, DTR normal. Strength 5/5 in all 4.  Psychiatric: Normal judgment and insight. Alert and oriented x 3. Normal mood.    Labs on Admission: I have personally reviewed following labs and imaging studies  CBC:  Recent Labs Lab 09/11/15 0614 09/16/15 1914  WBC  --  11.0*  NEUTROABS   --  8.9*  HGB  --  10.0*  HCT 32.8* 27.7*  MCV  --  102.6*  PLT  --  138*   Basic Metabolic Panel:  Recent Labs Lab 09/10/15 0538 09/11/15 0614 09/16/15 1914  NA 131* 132* 126*  K 3.3* 3.9 3.1*  CL 95* 98* 93*  CO2 29 28 24   GLUCOSE 98 87 96  BUN 8 7 14   CREATININE 0.41* 0.39* 0.53  CALCIUM 6.6* 6.6* 6.9*  MG  --  1.7 1.1*   GFR: Estimated Creatinine Clearance: 121.6 mL/min (by C-G formula based on SCr of 0.8 mg/dL). Liver Function Tests:  Recent Labs Lab 09/11/15 0614 09/16/15 1914  AST 57* 81*  ALT 32 50  ALKPHOS 114 115  BILITOT 1.5* 3.1*  PROT 5.3* 5.2*  ALBUMIN 1.8* 1.7*    Recent Labs Lab 09/16/15 1914  LIPASE 12   No results for input(s): AMMONIA in the last 168 hours. Coagulation Profile: No results for input(s): INR, PROTIME in the last 168 hours. Cardiac Enzymes:  Recent Labs Lab 09/16/15 1914  TROPONINI <0.03   Anemia Panel:  Recent Labs  09/16/15 1914  RETICCTPCT 2.2   Urine analysis:    Component Value Date/Time   COLORURINE ORANGE (A) 09/16/2015 1900   APPEARANCEUR CLEAR 09/16/2015 1900   LABSPEC 1.020 09/16/2015 1900   PHURINE 5.5 09/16/2015 1900   GLUCOSEU NEGATIVE 09/16/2015 1900   HGBUR LARGE (A) 09/16/2015 1900   BILIRUBINUR MODERATE (A) 09/16/2015 1900   KETONESUR NEGATIVE 09/16/2015 1900   PROTEINUR NEGATIVE 09/16/2015 1900   UROBILINOGEN 1.0 02/19/2012 1838   NITRITE POSITIVE (A) 09/16/2015 1900   LEUKOCYTESUR MODERATE (A) 09/16/2015 1900    Recent Results (from the past 240 hour(s))  Urine culture     Status: Abnormal   Collection Time: 09/07/15  7:14 PM  Result Value Ref Range Status   Specimen Description URINE, CATHETERIZED  Final   Special Requests NONE  Final  Culture MULTIPLE SPECIES PRESENT, SUGGEST RECOLLECTION (A)  Final   Report Status 09/09/2015 FINAL  Final  MRSA PCR Screening     Status: Abnormal   Collection Time: 09/07/15 10:15 PM  Result Value Ref Range Status   MRSA by PCR RESULT CALLED  TO, READ BACK BY AND VERIFIED WITH: (A) NEGATIVE Final    Comment: COVINGTON,L AT 0815 BY HUFFINES,S ON 09/08/15.        The GeneXpert MRSA Assay (FDA approved for NASAL specimens only), is one component of a comprehensive MRSA colonization surveillance program. It is not intended to diagnose MRSA infection nor to guide or monitor treatment for MRSA infections.   Blood culture (routine x 2)     Status: None (Preliminary result)   Collection Time: 09/16/15  8:25 PM  Result Value Ref Range Status   Specimen Description BLOOD PIC LINE  Final   Special Requests BOTTLES DRAWN AEROBIC AND ANAEROBIC 6CC  Final   Culture PENDING  Incomplete   Report Status PENDING  Incomplete  Blood culture (routine x 2)     Status: None (Preliminary result)   Collection Time: 09/16/15  8:36 PM  Result Value Ref Range Status   Specimen Description BLOOD LEFT HAND  Final   Special Requests BOTTLES DRAWN AEROBIC AND ANAEROBIC 6CC  Final   Culture PENDING  Incomplete   Report Status PENDING  Incomplete     Radiological Exams on Admission: Dg Chest Portable 1 View  Result Date: 09/16/2015 CLINICAL DATA:  Chest pain.  Pain over entire body. EXAM: PORTABLE CHEST 1 VIEW COMPARISON:  One-view chest x-ray 02/19/2012. CT of the chest 01/21/2015. FINDINGS: Heart size is normal. Chronic elevation of the right hemidiaphragm is again noted. Chronic interstitial coarsening is again noted. No focal airspace consolidation is present. A right-sided PICC line terminates at the cavoatrial junction. IMPRESSION: 1. No acute cardiopulmonary disease or significant interval change. 2. Chronic elevation of right hemidiaphragm. 3. Right-sided PICC line. Electronically Signed   By: Marin Roberts M.D.   On: 09/16/2015 19:41    EKG: Independently reviewed. Prolonged qtc, sinus tach Old chart reviewed   Assessment/Plan 60 yo female with likely some opiate withdrawal symptoms, uti and lactic acidosis  Principal Problem:    UTI (lower urinary tract infection)- this may be contributing also to her overall not feeling well.  Place on iv rocephin.  Lactic acid level 5, given over 2 liters of ivf, see discussion below about lactic levels.  Urine and blood cx obtained.  Possible early SIRS component.  Active Problems:   Lactic acidosis - this is likely due to a combination of overall volume losses from her withdrawal and brewing infection.  Vitals are normal.  Will add procalcitonin level and serial her lactic acid levels.  ivf.   Morbid obesity (HCC)- noted   Hypokalemia-  Replete thru ivf levels.  Replete mag also   Obstructive sleep apnea- noted   Hypomagnesemia- given 2 gm mag sulfate in ED, repeat level now and replete more if needed   Chronic pain disorder- noted.  Adjust pain meds   QT prolongation- noted, improved from last hospitalization   Admit to medial bed.   DVT prophylaxis:   Lovenox, scds Code Status:   Full code  DAVID,RACHAL A MD [facility]  If 7PM-7AM, please contact night-coverage [email or web address] Password [email or web address]  09/16/2015, 10:02 PM

## 2015-09-17 DIAGNOSIS — R532 Functional quadriplegia: Secondary | ICD-10-CM | POA: Diagnosis present

## 2015-09-17 LAB — IRON AND TIBC: Iron: 55 ug/dL (ref 28–170)

## 2015-09-17 LAB — CBC
HCT: 25.9 % — ABNORMAL LOW (ref 36.0–46.0)
HEMOGLOBIN: 9.2 g/dL — AB (ref 12.0–15.0)
MCH: 36.5 pg — ABNORMAL HIGH (ref 26.0–34.0)
MCHC: 35.5 g/dL (ref 30.0–36.0)
MCV: 102.8 fL — ABNORMAL HIGH (ref 78.0–100.0)
PLATELETS: 127 10*3/uL — AB (ref 150–400)
RBC: 2.52 MIL/uL — AB (ref 3.87–5.11)
RDW: 22.1 % — ABNORMAL HIGH (ref 11.5–15.5)
WBC: 10.1 10*3/uL (ref 4.0–10.5)

## 2015-09-17 LAB — MAGNESIUM: MAGNESIUM: 1.5 mg/dL — AB (ref 1.7–2.4)

## 2015-09-17 LAB — BASIC METABOLIC PANEL
ANION GAP: 9 (ref 5–15)
BUN: 15 mg/dL (ref 6–20)
CALCIUM: 7.3 mg/dL — AB (ref 8.9–10.3)
CO2: 26 mmol/L (ref 22–32)
CREATININE: 0.5 mg/dL (ref 0.44–1.00)
Chloride: 95 mmol/L — ABNORMAL LOW (ref 101–111)
Glucose, Bld: 87 mg/dL (ref 65–99)
Potassium: 3.6 mmol/L (ref 3.5–5.1)
SODIUM: 130 mmol/L — AB (ref 135–145)

## 2015-09-17 LAB — LACTIC ACID, PLASMA: LACTIC ACID, VENOUS: 3.8 mmol/L — AB (ref 0.5–1.9)

## 2015-09-17 LAB — FOLATE: Folate: 3.6 ng/mL — ABNORMAL LOW (ref >5.9)

## 2015-09-17 LAB — VITAMIN B12: Vitamin B-12: 1191 pg/mL — ABNORMAL HIGH (ref 180–914)

## 2015-09-17 LAB — FERRITIN: FERRITIN: 749 ng/mL — AB (ref 11–307)

## 2015-09-17 MED ORDER — DICLOFENAC SODIUM 1 % TD GEL
4.0000 g | Freq: Four times a day (QID) | TRANSDERMAL | Status: DC
Start: 2015-09-17 — End: 2015-09-23
  Administered 2015-09-17 – 2015-09-23 (×19): 4 g via TOPICAL
  Filled 2015-09-17: qty 100

## 2015-09-17 MED ORDER — CEFTRIAXONE SODIUM 2 G IJ SOLR
INTRAMUSCULAR | Status: AC
  Filled 2015-09-17: qty 2

## 2015-09-17 MED ORDER — SODIUM CHLORIDE 0.9% FLUSH
10.0000 mL | Freq: Three times a day (TID) | INTRAVENOUS | Status: DC
  Administered 2015-09-17 – 2015-09-23 (×13): 10 mL via INTRAVENOUS

## 2015-09-17 MED ORDER — MAGNESIUM OXIDE 400 (241.3 MG) MG PO TABS
400.0000 mg | ORAL_TABLET | Freq: Two times a day (BID) | ORAL | Status: DC
  Administered 2015-09-17 – 2015-09-23 (×13): 400 mg via ORAL
  Filled 2015-09-17 (×14): qty 1

## 2015-09-17 MED ORDER — ALUM & MAG HYDROXIDE-SIMETH 200-200-20 MG/5ML PO SUSP: 15.0000 mL | ORAL | Status: DC | PRN

## 2015-09-17 MED ORDER — MAGNESIUM SULFATE 2 GM/50ML IV SOLN
2.0000 g | Freq: Once | INTRAVENOUS | Status: AC
  Administered 2015-09-17: 2 g via INTRAVENOUS

## 2015-09-17 MED ORDER — OXYCODONE HCL 5 MG PO TABS
10.0000 mg | ORAL_TABLET | Freq: Four times a day (QID) | ORAL | Status: DC | PRN
  Administered 2015-09-17 – 2015-09-19 (×8): 10 mg via ORAL
  Filled 2015-09-17 (×8): qty 2

## 2015-09-17 MED ORDER — FENTANYL 75 MCG/HR TD PT72
150.0000 ug | MEDICATED_PATCH | TRANSDERMAL | Status: DC
  Administered 2015-09-17: 150 ug via TRANSDERMAL
  Filled 2015-09-17: qty 2

## 2015-09-17 MED ORDER — ENOXAPARIN SODIUM 80 MG/0.8ML ~~LOC~~ SOLN
80.0000 mg | Freq: Every day | SUBCUTANEOUS | Status: DC
  Administered 2015-09-17: 80 mg via SUBCUTANEOUS
  Filled 2015-09-17 (×2): qty 0.8

## 2015-09-17 NOTE — Clinical Social Work Note (Signed)
Clinical Social Work Assessment  Patient Details  Name: [name] MRN: [id number] Date of Birth: [birth year]  Date of referral:  09/17/15               Reason for consult:  Discharge Planning                Permission sought to share information with:  Chartered certified accountant granted to share information::  Yes, Verbal Permission Granted  Name::        Agency::  [facility]  Relationship::  facility  Contact Information:     Housing/Transportation Living arrangements for the past 2 months:  [place] of Information:  Patient, Facility Patient Interpreter Needed:  None Criminal Activity/Legal Involvement Pertinent to Current Situation/Hospitalization:  No - Comment as needed Significant Relationships:  Adult Children, Siblings Lives with:  Facility Resident Do you feel safe going back to the place where you live?  Yes Need for family participation in patient care:  No (Coment)  Care giving concerns:  None reported. Pt is long term resident at [facility].    Social Worker assessment / plan:  CSW met with pt at bedside along with a friend who was present with pt's permission. Pt alert and oriented and known to CSW from recent admission. She has been a resident at [facility] for about 3 years now. [name] and sister are very involved and supportive. Pt indicates that EMS was called yesterday as she was in severe pain. Admitted with UTI. Pt has chronic foley. Per [name] at facility, pt is nursing level of care and okay to return. She is bed bound as she chooses not to get out of bed with lift.   Employment status:  Disabled (Comment on whether or not currently receiving Disability) Insurance information:  Medicaid In Breaks PT Recommendations:  Not assessed at this time Information / Referral to community resources:  Other (Comment Required) (Return to [facility])  Patient/Family's Response to care:  Pt requests return to [facility]  when medically stable.   Patient/Family's Understanding of and Emotional Response to Diagnosis, Current Treatment, and Prognosis:  Pt shared admission diagnosis and concern about pain medications being cut last admission. She states that her physician at facility put her back on her regular medications over weekend. She appeared frustrated that she has been in constant pain but is not sure why. Brief support provided.   Emotional Assessment Appearance:  Appears stated age Attitude/Demeanor/Rapport:  Other (Cooperative) Affect (typically observed):  Appropriate Orientation:  Oriented to Self, Oriented to  Time, Oriented to Situation, Oriented to Place Alcohol / Substance use:  Not Applicable Psych involvement (Current and /or in the community):  No (Comment)  Discharge Needs  Concerns to be addressed:  Discharge Planning Concerns Readmission within the last 30 days:  Yes Current discharge risk:  None Barriers to Discharge:  Continued Medical Work up   Salome Arnt, [place] 09/17/2015, 8:51 AM [phone]

## 2015-09-17 NOTE — Progress Notes (Signed)
Pt setup on CPAP auto titrate pt tol well spo2 96% therapist will continue to monitor

## 2015-09-17 NOTE — Progress Notes (Signed)
PROGRESS NOTE    [name]  ZOX:[id number] DOB: 16-Apr-1955 DOA: 09/16/2015 PCP: Josue Hector, MD    Brief Narrative:  36 yof with a hx of obesity,  chronic indwelling foley, chronic pain medication, DVT, anxiety, depression, GERD, and anemia presents with complaints of pain. Pt was discharged last week and has returned with associated symptoms of vomiting and abdominal cramps. Pt was noted to be hypokalemic and hypomagnesemic with an increased lactic acid levels. UA was indicative of infection. Pt was admitted for further evaluation of a UTI.    Assessment & Plan:   Principal Problem:   UTI (lower urinary tract infection) Active Problems:   Morbid obesity (HCC)   Hypokalemia   Obstructive sleep apnea   Hypomagnesemia   Chronic pain disorder   QT prolongation   1. UTI with hx of indwelling foley catheter. UA indicative of infection. Follow up urine and blood cultures. Continue IV antibiotics.  2. Lactic acidosis. Likely due to volume depletion with poor PO intake and persistent  vomiting. Lactic acid is improving. Continue IVFs 3. Anemia. Hgb trending down possibly due to delpetion. No evidence of bleeding. Anemia panel is in process.   4. Hypokalemia. Resolved.  5. Hypomagnesemia. Magnesium replaced.  6. Chronic pain disorder. Noted. Adjust pain medications. 7. GERD. Continue PPI.  8. Obstructive sleep apnea. Start the pt on CPAP.  9. Morbid obesity. Noted.  10.  Functional quadriplegia. Pt has been bed bound for almost a year now. She reports chronic issues with her neck and back. She al;so has sever neuropathy. May benefit from some physical therapy. She certainly is not a candidate for any invasive treatments. Her weakness//numbness/pain has been long standing and does not appear to be acutely changed.    DVT prophylaxis: Lovenox  Code Status: Full  Family Communication: No family bedside Disposition Plan: Discharge back to SNF once improved    Consultants:     None   Procedures:   None   Antimicrobials:   Rocephin 8/9 >>   Subjective: Feels better than yesterday. States she had pain everywhere this morning.    Objective: Vitals:   09/16/15 2144 09/16/15 2145 09/17/15 0030 09/17/15 0655  BP: 99/64 99/64 112/75 110/69  Pulse: 104  (!) 102 100  Resp: 20 13 16 15   Temp:   98.3 F (36.8 C) 98 F (36.7 C)  TempSrc:   Oral Oral  SpO2: 98%  99% 100%  Weight:   (!) 162.8 kg (359 lb)   Height:        Intake/Output Summary (Last 24 hours) at 09/17/15 8119 Last data filed at 09/17/15 0500  Gross per 24 hour  Intake              350 ml  Output              300 ml  Net               50 ml   Filed Weights   09/16/15 1853 09/17/15 0030  Weight: (!) 158.8 kg (350 lb) (!) 162.8 kg (359 lb)    Examination:  General exam: Appears calm and comfortable  Respiratory system: Clear to auscultation. Respiratory effort normal. Cardiovascular system: S1 & S2 heard, RRR. No JVD, murmurs, rubs, gallops or clicks. 2+ bilateral LE edema. Gastrointestinal system: Abdomen is obese, soft and nontender. No organomegaly or masses felt. Normal bowel sounds heard. Central nervous system: Alert and oriented. No focal neurological deficits. Extremities: Symmetric 5 x 5 power.  Skin: No rashes, lesions or ulcers Psychiatry: Judgement and insight appear normal. Mood & affect appropriate.     Data Reviewed: I have personally reviewed following labs and imaging studies  CBC:  Recent Labs Lab 09/11/15 0614 09/16/15 1914 09/17/15 0650  WBC  --  11.0* 10.1  NEUTROABS  --  8.9*  --   HGB  --  10.0* 9.2*  HCT 32.8* 27.7* 25.9*  MCV  --  102.6* 102.8*  PLT  --  138* 127*   Basic Metabolic Panel:  Recent Labs Lab 09/11/15 0614 09/16/15 1914 09/17/15 0008 09/17/15 0650  NA 132* 126*  --  130*  K 3.9 3.1*  --  3.6  CL 98* 93*  --  95*  CO2 28 24  --  26  GLUCOSE 87 96  --  87  BUN 7 14  --  15  CREATININE 0.39* 0.53  --  0.50  CALCIUM  6.6* 6.9*  --  7.3*  MG 1.7 1.1* 1.5*  --    GFR: Estimated Creatinine Clearance: 123.5 mL/min (by C-G formula based on SCr of 0.8 mg/dL). Liver Function Tests:  Recent Labs Lab 09/11/15 0614 09/16/15 1914  AST 57* 81*  ALT 32 50  ALKPHOS 114 115  BILITOT 1.5* 3.1*  PROT 5.3* 5.2*  ALBUMIN 1.8* 1.7*    Recent Labs Lab 09/16/15 1914  LIPASE 12   No results for input(s): AMMONIA in the last 168 hours. Coagulation Profile: No results for input(s): INR, PROTIME in the last 168 hours. Cardiac Enzymes:  Recent Labs Lab 09/16/15 1914  TROPONINI <0.03   BNP (last 3 results) No results for input(s): PROBNP in the last 8760 hours. HbA1C: No results for input(s): HGBA1C in the last 72 hours. CBG: No results for input(s): GLUCAP in the last 168 hours. Lipid Profile: No results for input(s): CHOL, HDL, LDLCALC, TRIG, CHOLHDL, LDLDIRECT in the last 72 hours. Thyroid Function Tests: No results for input(s): TSH, T4TOTAL, FREET4, T3FREE, THYROIDAB in the last 72 hours. Anemia Panel:  Recent Labs  09/16/15 1914  RETICCTPCT 2.2   Sepsis Labs:  Recent Labs Lab 09/16/15 1914 09/16/15 2030 09/16/15 2036 09/17/15 0008  PROCALCITON 17.59  --   --   --   LATICACIDVEN  --  5.38* 5.1* 3.8*    Recent Results (from the past 240 hour(s))  Urine culture     Status: Abnormal   Collection Time: 09/07/15  7:14 PM  Result Value Ref Range Status   Specimen Description URINE, CATHETERIZED  Final   Special Requests NONE  Final   Culture MULTIPLE SPECIES PRESENT, SUGGEST RECOLLECTION (A)  Final   Report Status 09/09/2015 FINAL  Final  MRSA PCR Screening     Status: Abnormal   Collection Time: 09/07/15 10:15 PM  Result Value Ref Range Status   MRSA by PCR RESULT CALLED TO, READ BACK BY AND VERIFIED WITH: (A) NEGATIVE Final    Comment: COVINGTON,L AT 0815 BY HUFFINES,S ON 09/08/15.        The GeneXpert MRSA Assay (FDA approved for NASAL specimens only), is one component of  a comprehensive MRSA colonization surveillance program. It is not intended to diagnose MRSA infection nor to guide or monitor treatment for MRSA infections.   Blood culture (routine x 2)     Status: None (Preliminary result)   Collection Time: 09/16/15  8:25 PM  Result Value Ref Range Status   Specimen Description BLOOD PIC LINE  Final   Special Requests BOTTLES DRAWN  AEROBIC AND ANAEROBIC 6CC  Final   Culture PENDING  Incomplete   Report Status PENDING  Incomplete  Blood culture (routine x 2)     Status: None (Preliminary result)   Collection Time: 09/16/15  8:36 PM  Result Value Ref Range Status   Specimen Description BLOOD LEFT HAND  Final   Special Requests BOTTLES DRAWN AEROBIC AND ANAEROBIC [facility]6CC  Final   Culture PENDING  Incomplete   Report Status PENDING  Incomplete         Radiology Studies: Dg Chest Portable 1 View  Result Date: 09/16/2015 CLINICAL DATA:  Chest pain.  Pain over entire body. EXAM: PORTABLE CHEST 1 VIEW COMPARISON:  One-view chest x-ray 02/19/2012. CT of the chest 01/21/2015. FINDINGS: Heart size is normal. Chronic elevation of the right hemidiaphragm is again noted. Chronic interstitial coarsening is again noted. No focal airspace consolidation is present. A right-sided PICC line terminates at the cavoatrial junction. IMPRESSION: 1. No acute cardiopulmonary disease or significant interval change. 2. Chronic elevation of right hemidiaphragm. 3. Right-sided PICC line. Electronically Signed   By: Marin Robertshristopher  Mattern M.D.   On: 09/16/2015 19:41        Scheduled Meds: . aspirin EC  81 mg Oral Daily  . bisacodyl  10 mg Oral q morning - 10a  . calcium carbonate  1 tablet Oral TID WC  . cefTRIAXone (ROCEPHIN)  IV  2 g Intravenous Q24H  . enoxaparin (LOVENOX) injection  40 mg Subcutaneous QHS  . feeding supplement  1 Container Oral Q24H  . feeding supplement (PRO-STAT SUGAR FREE 64)  30 mL Oral BID  . fentaNYL  150 mcg Transdermal Q72H  . losartan  25 mg  Oral Daily  . magnesium oxide  400 mg Oral BID  . mupirocin ointment  1 application Nasal BID  . nystatin  5 mL Oral QID  . potassium chloride  20 mEq Oral TID  . sertraline  100 mg Oral QHS  . sodium chloride flush  10 mL Intravenous Q8H  . [START ON 09/27/2015] Vitamin D (Ergocalciferol)  50,000 Units Oral Q30 days   Continuous Infusions: . 0.9 % NaCl with KCl 20 mEq / L 75 mL/hr at 09/17/15 0036     LOS: 1 day    Time spent: 25 minutes     Erick BlinksJehanzeb Memon, MD [facility] If 7PM-7AM, please contact night-coverage [email or web address] Password [email or web address] 09/17/2015, 8:12 AM   By signing my name below, I, Cynda AcresHailei Fulton, attest that this documentation has been prepared under the direction and in the presence of Erick BlinksJehanzeb, Memon, MD. Electronically signed: Cynda AcresHailei Fulton, Scribe. 09/17/15 1:09 PM   I, Dr. Erick BlinksJehanzeb Memon, personally performed the services described in this documentaiton. All medical record entries made by the scribe were at my direction and in my presence. I have reviewed the chart and agree that the record reflects my personal performance and is accurate and complete  Erick BlinksJehanzeb Memon, MD, 09/17/2015 1:34 PM

## 2015-09-17 NOTE — Progress Notes (Signed)
Patient sleeping when I came into room to give pain medicine.

## 2015-09-17 NOTE — Care Management Note (Signed)
Case Management Note  Patient Details  Name: Eustace QuailDebbie J Bosak MRN: [id number] Date of Birth: 01/13/1956  Status of Service:  Completed, signed off  If discussed at Long Length of Stay Meetings, dates discussed:    Additional Comments: [facility] and plans to return at discharge. CSW aware and making arrangements for return when appropriate.  Drayven Marchena, Chrystine OilerSharley Diane, RN 09/17/2015, 12:01 PM

## 2015-09-17 NOTE — NC FL2 (Signed)
[place] MEDICAID FL2 LEVEL OF CARE SCREENING TOOL     IDENTIFICATION  Patient Name: [name] Birthdate: 04/10/1955 Sex: female Admission Date (Current Location): 09/16/2015  [place] and [place] Number:  Aaron Edelman [id number] R Facility and Address:  [facility],  [address], [place] [address]      Provider Number: [id number]  Attending Physician Name and Address:  Erick Blinks, MD  Relative Name and Phone Number:       Current Level of Care: Hospital Recommended Level of Care: Skilled Nursing Facility Prior Approval Number:    Date Approved/Denied:   PASRR Number: [id number]  Discharge Plan: SNF    Current Diagnoses: Patient Active Problem List   Diagnosis Date Noted  . Sepsis (HCC) 09/16/2015  . Hypomagnesemia 09/16/2015  . Chronic pain disorder 09/16/2015  . UTI (lower urinary tract infection) 09/16/2015  . QT prolongation 09/16/2015  . Pressure ulcer 09/10/2015  . Obstructive sleep apnea 09/09/2015  . Hypokalemia 09/07/2015  . Anemia 05/28/2013  . GERD (gastroesophageal reflux disease) 05/27/2013  . Laryngopharyngeal reflux (LPR) 05/27/2013  . Constipation 02/19/2012  . Syncope and collapse 02/19/2012  . Chest pain 02/19/2012  . Knee osteoarthritis 05/03/2011  . Rotator cuff syndrome of right shoulder 05/03/2011  . Lumbar facet arthropathy 05/03/2011  . Morbid obesity (HCC) 05/03/2011    Orientation RESPIRATION BLADDER Height & Weight     Self, Time, Situation, Place  Normal Indwelling catheter Weight: (!) 359 lb (162.8 kg) Height:   (170.2 cm)  BEHAVIORAL SYMPTOMS/MOOD NEUROLOGICAL BOWEL NUTRITION STATUS  Other (Comment) (N/A)  (N/A) Incontinent Diet (Heart healthy)  AMBULATORY STATUS COMMUNICATION OF NEEDS Skin   Total Care Verbally Other (Comment) (Moisture associated skin damage to groin, buttocks. Stage II to right buttocks with foam dressing. Stage II to left buttocks with foam dressing. Diabetic ulcer right thigh.  )                       Personal Care Assistance Level of Assistance  Bathing, Feeding, Dressing Bathing Assistance: Maximum assistance Feeding assistance: Limited assistance Dressing Assistance: Maximum assistance     Functional Limitations Info  Sight, Hearing, Speech Sight Info: Adequate Hearing Info: Adequate      SPECIAL CARE FACTORS FREQUENCY                       Contractures      Additional Factors Info  Psychotropic Code Status Info: Full code Allergies Info: Sulfa antibiotics Psychotropic Info: Xanax   Isolation Precautions Info: 09/07/15 MRSA by PCR. Contact precautions.     Current Medications (09/17/2015):  This is the current hospital active medication list Current Facility-Administered Medications  Medication Dose Route Frequency Provider Last Rate Last Dose  . 0.9 % NaCl with KCl 20 mEq/ L  infusion   Intravenous Continuous Haydee Monica, MD 75 mL/hr at 09/17/15 0036    . ALPRAZolam Prudy Feeler) tablet 0.5 mg  0.5 mg Oral Q6H PRN Haydee Monica, MD   0.5 mg at 09/17/15 0920  . alum & mag hydroxide-simeth (MAALOX/MYLANTA) 200-200-20 MG/5ML suspension 15 mL  15 mL Oral Q4H PRN Erick Blinks, MD      . aspirin EC tablet 81 mg  81 mg Oral Daily Haydee Monica, MD   81 mg at 09/17/15 0903  . bisacodyl (DULCOLAX) EC tablet 10 mg  10 mg Oral q morning - 10a Haydee Monica, MD   10 mg at 09/17/15 0903  .  calcium carbonate (TUMS - dosed in mg elemental calcium) chewable tablet 200 mg of elemental calcium  1 tablet Oral TID WC Haydee Monicaachal A David, MD   200 mg of elemental calcium at 09/17/15 0903  . cefTRIAXone (ROCEPHIN) 2 g in dextrose 5 % 50 mL IVPB  2 g Intravenous Q24H Rachal A Onalee Huaavid, MD      . enoxaparin (LOVENOX) injection 40 mg  40 mg Subcutaneous QHS Haydee Monicaachal A David, MD   40 mg at 09/17/15 0035  . feeding supplement (BOOST / RESOURCE BREEZE) liquid 1 Container  1 Container Oral Q24H Haydee Monicaachal A David, MD   1 Container at 09/17/15 1000  . feeding supplement  (PRO-STAT SUGAR FREE 64) liquid 30 mL  30 mL Oral BID Haydee Monicaachal A David, MD   30 mL at 09/17/15 0904  . fentaNYL (DURAGESIC - dosed mcg/hr) 150 mcg  150 mcg Transdermal Q72H Haydee Monicaachal A David, MD   150 mcg at 09/17/15 0034  . fentaNYL (SUBLIMAZE) injection 25 mcg  25 mcg Intravenous Q4H PRN Haydee Monicaachal A David, MD      . losartan (COZAAR) tablet 25 mg  25 mg Oral Daily Haydee Monicaachal A David, MD   25 mg at 09/17/15 0903  . magnesium oxide (MAG-OX) tablet 400 mg  400 mg Oral BID Haydee Monicaachal A David, MD   400 mg at 09/17/15 0903  . mupirocin ointment (BACTROBAN) 2 % 1 application  1 application Nasal BID Haydee Monicaachal A David, MD   1 application at 09/17/15 [phone]  . nitroGLYCERIN (NITROSTAT) SL tablet 0.4 mg  0.4 mg Sublingual Q5 min PRN Haydee Monicaachal A David, MD      . nystatin (MYCOSTATIN) 100000 UNIT/ML suspension 500,000 Units  5 mL Oral QID Haydee Monicaachal A David, MD   500,000 Units at 09/17/15 0904  . oxyCODONE (Oxy IR/ROXICODONE) immediate release tablet 10 mg  10 mg Oral Q6H PRN Haydee Monicaachal A David, MD   10 mg at 09/17/15 0904  . potassium chloride SA (K-DUR,KLOR-CON) CR tablet 20 mEq  20 mEq Oral TID Haydee Monicaachal A David, MD   20 mEq at 09/17/15 0903  . promethazine (PHENERGAN) suppository 25 mg  25 mg Rectal Q4H PRN Haydee Monicaachal A David, MD      . promethazine (PHENERGAN) tablet 25 mg  25 mg Oral Q4H PRN Haydee Monicaachal A David, MD      . sertraline (ZOLOFT) tablet 100 mg  100 mg Oral QHS Rachal A Onalee Huaavid, MD      . sodium chloride flush (NS) 0.9 % injection 10 mL  10 mL Intravenous Q8H Haydee Monicaachal A David, MD      . Melene Muller[START ON 09/27/2015] Vitamin D (Ergocalciferol) (DRISDOL) capsule 50,000 Units  50,000 Units Oral Q30 days Haydee Monicaachal A David, MD         Discharge Medications: Please see discharge summary for a list of discharge medications.  Relevant Imaging Results:  Relevant Lab Results:   Additional Information Pt has PICC line.   Derenda FennelStultz, Katlynn Naser [place], [place]CS[place] [phone]

## 2015-09-17 NOTE — Progress Notes (Signed)
CRITICAL VALUE ALERT  Critical value received:  Lactic acid 3.8  Date of notification:  09/17/15  Time of notification:  0051  Critical value read back:Yes.    Nurse who received alert:  B. Larina BrasStone, RN  MD notified (1st page):  Dr. Onalee Huaavid  Time of first page: 0120  Responding MD:  Dr. Onalee Huaavid     Time MD responded:  [phone]

## 2015-09-18 LAB — URINE CULTURE

## 2015-09-18 LAB — BASIC METABOLIC PANEL
Anion gap: 7 (ref 5–15)
BUN: 19 mg/dL (ref 6–20)
CHLORIDE: 95 mmol/L — AB (ref 101–111)
CO2: 26 mmol/L (ref 22–32)
CREATININE: 0.48 mg/dL (ref 0.44–1.00)
Calcium: 7.4 mg/dL — ABNORMAL LOW (ref 8.9–10.3)
GFR calc non Af Amer: >60 mL/min (ref >60)
GLUCOSE: 105 mg/dL — AB (ref 65–99)
Potassium: 3.6 mmol/L (ref 3.5–5.1)
Sodium: 128 mmol/L — ABNORMAL LOW (ref 135–145)

## 2015-09-18 LAB — CBC
HCT: 23.9 % — ABNORMAL LOW (ref 36.0–46.0)
Hemoglobin: 8.5 g/dL — ABNORMAL LOW (ref 12.0–15.0)
MCH: 37.3 pg — AB (ref 26.0–34.0)
MCHC: 35.6 g/dL (ref 30.0–36.0)
MCV: 104.8 fL — AB (ref 78.0–100.0)
PLATELETS: 129 10*3/uL — AB (ref 150–400)
RBC: 2.28 MIL/uL — AB (ref 3.87–5.11)
RDW: 22 % — ABNORMAL HIGH (ref 11.5–15.5)
WBC: 7.5 10*3/uL (ref 4.0–10.5)

## 2015-09-18 LAB — PROCALCITONIN: Procalcitonin: 12.68 ng/mL

## 2015-09-18 MED ORDER — ALBUMIN HUMAN 25 % IV SOLN
25.0000 g | Freq: Four times a day (QID) | INTRAVENOUS | Status: AC
Start: 2015-09-18 — End: 2015-09-20
  Administered 2015-09-18 – 2015-09-20 (×7): 25 g via INTRAVENOUS
  Filled 2015-09-18 (×8): qty 100

## 2015-09-18 MED ORDER — SODIUM CHLORIDE 0.9 % IV SOLN
INTRAVENOUS | Status: DC
Start: 2015-09-18 — End: 2015-09-20
  Administered 2015-09-18 – 2015-09-20 (×3): via INTRAVENOUS

## 2015-09-18 NOTE — Progress Notes (Signed)
Patient c/o chronic, constant pain to "neck and back". BP running low, last one 87/51. Discussed with patient that  pain medication may lower BP, so nursing would discuss options with MD.  Memory Argueffered to reposition, pt stated "no, it hurts worse to move." Notified Dr. Kerry HoughMemon. No pain medication to be given while BP running low. Nursing to monitor. Earnstine RegalAshley Siddalee Vanderheiden, RN

## 2015-09-18 NOTE — Progress Notes (Signed)
Patient BP 99/58. Notified Dr. Kerry HoughMemon. Stated okay to give oxycodone as ordered PRN pain. Earnstine RegalAshley Prim Morace, RN

## 2015-09-18 NOTE — Clinical Social Work Note (Signed)
CSW updated [facility] on pt. Facility can accept pt when stable.   Derenda FennelKara Heberto [place], LCSW [phone]

## 2015-09-18 NOTE — Care Management Note (Addendum)
Case Management Note-  Patient Details  Name: [name] MRN: [id number] Date of Birth: 11/07/1955   StatusEustace Quail of Service:  Completed, signed off  If discussed at Long Length of Stay Meetings, dates discussed:    Additional Comments: Patient will need CPAP. [facility] at [phone].  Siara Gorder, Chrystine OilerSharley Diane, RN 09/18/2015, 11:47 AM -

## 2015-09-18 NOTE — Care Management Important Message (Signed)
Important Message  Patient Details  Name: [name] MRN: [id number] Date of Birth: 03/19/1955   Medicare Important Message Given:  Yes    Loucille Takach, Chrystine OilerSharley Diane, RN 09/18/2015, 11:52 AM

## 2015-09-18 NOTE — Progress Notes (Signed)
PROGRESS NOTE    [name]  ZOX:[id number] DOB: 10/09/1955 DOA: 09/16/2015 PCP: Josue HectorNYLAND,LEONARD ROBERT, MD    Brief Narrative:  [age] yof with a hx of obesity,  chronic indwelling foley, chronic pain medication, DVT, anxiety, depression, GERD, and anemia presents with complaints of pain. Pt was discharged last week and has returned with associated symptoms of vomiting and abdominal cramps. Pt was noted to be hypokalemic and hypomagnesemic with an increased lactic acid levels. UA was indicative of infection. Pt was admitted for further evaluation of a UTI. Follow up urine culture. She will likely need a few more days of treatment.   Assessment & Plan:   Principal Problem:   UTI (lower urinary tract infection) Active Problems:   Morbid obesity (HCC)   Hypokalemia   Obstructive sleep apnea   Hypomagnesemia   Chronic pain disorder   QT prolongation   Functional quadriplegia (HCC)   1. UTI with hx of indwelling foley catheter. UA indicative of infection. Follow up urine culture. Blood culture shows no growth. Continue IV antibiotics.  2. Lactic acidosis. Likely due to volume depletion with poor PO intake and persistent  vomiting. Lactic acid is improving. Continue IVFs 3. Anemia. Hgb trending down possibly due to hemodilution. Platelets 129. No evidence of bleeding. Anemia panel is in process.  Continue to monitor. 4. Hyponatremia. Mildly low. Continue IV fluids 5. Hypokalemia. Resolved.  6. Hypomagnesemia. Magnesium replaced.  7. Chronic pain disorder. Noted. Adjust pain medications. 8. GERD. Continue PPI.  9. Obstructive sleep apnea. Start the pt on CPAP.  10. Morbid obesity. Noted.  11.  Functional quadriplegia. Pt has been bed bound for almost a year now. She reports chronic issues with her neck and back. She also has severe neuropathy. May benefit from some physical therapy. She certainly is not a candidate for any invasive treatments. Her weakness//numbness/pain has been long  standing and does not appear to be acutely changed. Will check TSH and B12 12. History of hypertension. Hold losartan in the setting of low blood pressures 13. Hypoalbuminemia. Start on albumin infusions to help with blood pressures, since she is likely third spacing IV fluids.   DVT prophylaxis: Lovenox  Code Status: Full  Family Communication: discussed with sister at bedside Disposition Plan: Discharge back to SNF once improved    Consultants:   None   Procedures:   None   Antimicrobials:   Rocephin 8/9 >>   Subjective: Continues to have generalized pain. Pain in neck and spine. Chronic weakness in LE is unchanged. Also feels weak in arms.    Objective: Vitals:   09/17/15 1544 09/17/15 2202 09/17/15 2312 09/18/15 0553  BP:  (!) 85/56  (!) 78/46  Pulse:  97 94 87  Resp:  16 16 16   Temp:  97.6 F (36.4 C)  97.6 F (36.4 C)  TempSrc:  Oral  Oral  SpO2: 91% 98% 96% 98%  Weight:      Height:        Intake/Output Summary (Last 24 hours) at 09/18/15 0802 Last data filed at 09/18/15 0557  Gross per 24 hour  Intake              390 ml  Output              650 ml  Net             -260 ml   Filed Weights   09/16/15 1853 09/17/15 0030  Weight: (!) 158.8 kg (350 lb) (!) 162.8  kg (359 lb)    Examination:  General exam: Appears calm and comfortable  Respiratory system: Clear to auscultation. Respiratory effort normal. Cardiovascular system: S1 & S2 heard, RRR. No JVD, murmurs, rubs, gallops or clicks. 2-3+ pedal edema. Gastrointestinal system: Abdomen is obese, soft and nontender. No organomegaly or masses felt. Normal bowel sounds heard. Central nervous system: Alert and oriented. Bilateral lower extremity weakness. Also has weakness in upper extremities. Skin: No rashes, lesions or ulcers Psychiatry: Judgement and insight appear normal. Mood & affect appropriate.    Data Reviewed: I have personally reviewed following labs and imaging studies  CBC:  Recent  Labs Lab 09/16/15 1914 09/17/15 0650 09/18/15 0526  WBC 11.0* 10.1 7.5  NEUTROABS 8.9*  --   --   HGB 10.0* 9.2* 8.5*  HCT 27.7* 25.9* 23.9*  MCV 102.6* 102.8* 104.8*  PLT 138* 127* 129*   Basic Metabolic Panel:  Recent Labs Lab 09/16/15 1914 09/17/15 0008 09/17/15 0650 09/18/15 0526  NA 126*  --  130* 128*  K 3.1*  --  3.6 3.6  CL 93*  --  95* 95*  CO2 24  --  26 26  GLUCOSE 96  --  87 105*  BUN 14  --  15 19  CREATININE 0.53  --  0.50 0.48  CALCIUM 6.9*  --  7.3* 7.4*  MG 1.1* 1.5*  --   --    GFR: Estimated Creatinine Clearance: 123.5 mL/min (by C-G formula based on SCr of 0.8 mg/dL). Liver Function Tests:  Recent Labs Lab 09/16/15 1914  AST 81*  ALT 50  ALKPHOS 115  BILITOT 3.1*  PROT 5.2*  ALBUMIN 1.7*    Recent Labs Lab 09/16/15 1914  LIPASE 12   No results for input(s): AMMONIA in the last 168 hours. Coagulation Profile: No results for input(s): INR, PROTIME in the last 168 hours. Cardiac Enzymes:  Recent Labs Lab 09/16/15 1914  TROPONINI <0.03   BNP (last 3 results) No results for input(s): PROBNP in the last 8760 hours. HbA1C: No results for input(s): HGBA1C in the last 72 hours. CBG: No results for input(s): GLUCAP in the last 168 hours. Lipid Profile: No results for input(s): CHOL, HDL, LDLCALC, TRIG, CHOLHDL, LDLDIRECT in the last 72 hours. Thyroid Function Tests: No results for input(s): TSH, T4TOTAL, FREET4, T3FREE, THYROIDAB in the last 72 hours. Anemia Panel:  Recent Labs  09/16/15 1914  VITAMINB12 1,191*  FOLATE 3.6*  FERRITIN 749*  TIBC NOT CALCULATED  IRON 55  RETICCTPCT 2.2   Sepsis Labs:  Recent Labs Lab 09/16/15 1914 09/16/15 2030 09/16/15 2036 09/17/15 0008 09/18/15 0526  PROCALCITON 17.59  --   --   --  12.68  LATICACIDVEN  --  5.38* 5.1* 3.8*  --     Recent Results (from the past 240 hour(s))  Blood culture (routine x 2)     Status: None (Preliminary result)   Collection Time: 09/16/15  8:25 PM    Result Value Ref Range Status   Specimen Description BLOOD PIC LINE  Final   Special Requests BOTTLES DRAWN AEROBIC AND ANAEROBIC 6CC  Final   Culture NO GROWTH < 24 HOURS  Final   Report Status PENDING  Incomplete  Blood culture (routine x 2)     Status: None (Preliminary result)   Collection Time: 09/16/15  8:36 PM  Result Value Ref Range Status   Specimen Description BLOOD LEFT HAND  Final   Special Requests BOTTLES DRAWN AEROBIC AND ANAEROBIC 6CC  Final  Culture NO GROWTH < 24 HOURS  Final   Report Status PENDING  Incomplete         Radiology Studies: Dg Chest Portable 1 View  Result Date: 09/16/2015 CLINICAL DATA:  Chest pain.  Pain over entire body. EXAM: PORTABLE CHEST 1 VIEW COMPARISON:  One-view chest x-ray 02/19/2012. CT of the chest 01/21/2015. FINDINGS: Heart size is normal. Chronic elevation of the right hemidiaphragm is again noted. Chronic interstitial coarsening is again noted. No focal airspace consolidation is present. A right-sided PICC line terminates at the cavoatrial junction. IMPRESSION: 1. No acute cardiopulmonary disease or significant interval change. 2. Chronic elevation of right hemidiaphragm. 3. Right-sided PICC line. Electronically Signed   By: Marin Roberts M.D.   On: 09/16/2015 19:41        Scheduled Meds: . aspirin EC  81 mg Oral Daily  . bisacodyl  10 mg Oral q morning - 10a  . calcium carbonate  1 tablet Oral TID WC  . cefTRIAXone (ROCEPHIN)  IV  2 g Intravenous Q24H  . diclofenac sodium  4 g Topical QID  . enoxaparin (LOVENOX) injection  80 mg Subcutaneous QHS  . feeding supplement  1 Container Oral Q24H  . feeding supplement (PRO-STAT SUGAR FREE 64)  30 mL Oral BID  . fentaNYL  150 mcg Transdermal Q72H  . losartan  25 mg Oral Daily  . magnesium oxide  400 mg Oral BID  . mupirocin ointment  1 application Nasal BID  . nystatin  5 mL Oral QID  . potassium chloride  20 mEq Oral TID  . sertraline  100 mg Oral QHS  . sodium chloride  flush  10 mL Intravenous Q8H  . [START ON 09/27/2015] Vitamin D (Ergocalciferol)  50,000 Units Oral Q30 days   Continuous Infusions:     LOS: 2 days    Time spent: 25 minutes     Erick Blinks, MD [facility] If 7PM-7AM, please contact night-coverage [email or web address] Password [email or web address] 09/18/2015, 8:02 AM

## 2015-09-18 NOTE — Progress Notes (Signed)
Patient had 200 ml of dark amber, concentrated urine output to foley catheter this evening for shift. Notified Dr. Kerry HoughMemon. Stated continue IV fluids and albumin as ordered. Nursing to monitor. Earnstine RegalAshley Marzell Allemand, RN

## 2015-09-19 ENCOUNTER — Inpatient Hospital Stay (HOSPITAL_COMMUNITY): Payer: Medicare Other

## 2015-09-19 DIAGNOSIS — R601 Generalized edema: Secondary | ICD-10-CM | POA: Diagnosis present

## 2015-09-19 DIAGNOSIS — I959 Hypotension, unspecified: Secondary | ICD-10-CM | POA: Diagnosis present

## 2015-09-19 DIAGNOSIS — D649 Anemia, unspecified: Secondary | ICD-10-CM

## 2015-09-19 LAB — BASIC METABOLIC PANEL
ANION GAP: 5 (ref 5–15)
BUN: 20 mg/dL (ref 6–20)
CO2: 26 mmol/L (ref 22–32)
Calcium: 7.7 mg/dL — ABNORMAL LOW (ref 8.9–10.3)
Chloride: 98 mmol/L — ABNORMAL LOW (ref 101–111)
Creatinine, Ser: 0.41 mg/dL — ABNORMAL LOW (ref 0.44–1.00)
GFR calc Af Amer: >60 mL/min (ref >60)
Glucose, Bld: 103 mg/dL — ABNORMAL HIGH (ref 65–99)
POTASSIUM: 3.6 mmol/L (ref 3.5–5.1)
SODIUM: 129 mmol/L — AB (ref 135–145)

## 2015-09-19 LAB — CBC
HEMATOCRIT: 21.2 % — AB (ref 36.0–46.0)
HEMOGLOBIN: 7.3 g/dL — AB (ref 12.0–15.0)
MCH: 36.9 pg — ABNORMAL HIGH (ref 26.0–34.0)
MCHC: 34.4 g/dL (ref 30.0–36.0)
MCV: 107.1 fL — ABNORMAL HIGH (ref 78.0–100.0)
Platelets: 122 10*3/uL — ABNORMAL LOW (ref 150–400)
RBC: 1.98 MIL/uL — ABNORMAL LOW (ref 3.87–5.11)
RDW: 21.6 % — ABNORMAL HIGH (ref 11.5–15.5)
WBC: 4.5 10*3/uL (ref 4.0–10.5)

## 2015-09-19 LAB — VITAMIN B12: VITAMIN B 12: 618 pg/mL (ref 180–914)

## 2015-09-19 LAB — TSH: TSH: 3.306 u[IU]/mL (ref 0.350–4.500)

## 2015-09-19 LAB — PREPARE RBC (CROSSMATCH)

## 2015-09-19 MED ORDER — OXYCODONE HCL 5 MG PO TABS
5.0000 mg | ORAL_TABLET | Freq: Four times a day (QID) | ORAL | Status: DC | PRN
  Administered 2015-09-19 – 2015-09-20 (×3): 5 mg via ORAL
  Filled 2015-09-19 (×3): qty 1

## 2015-09-19 MED ORDER — SODIUM CHLORIDE 0.9 % IV SOLN: Freq: Once | INTRAVENOUS | Status: DC

## 2015-09-19 MED ORDER — NALOXEGOL OXALATE 25 MG PO TABS
25.0000 mg | ORAL_TABLET | Freq: Every day | ORAL | Status: DC
  Administered 2015-09-20 – 2015-09-23 (×4): 25 mg via ORAL
  Filled 2015-09-19 (×7): qty 1

## 2015-09-19 MED ORDER — FENTANYL 100 MCG/HR TD PT72
100.0000 ug | MEDICATED_PATCH | TRANSDERMAL | Status: DC
  Administered 2015-09-20: 100 ug via TRANSDERMAL
  Filled 2015-09-19: qty 1

## 2015-09-19 NOTE — Progress Notes (Signed)
PROGRESS NOTE    [name]  ZOX:[id number] DOB: [birth year] DOA: 09/16/2015 PCP: Josue Hector, MD    Brief Narrative:  35 yof with a hx of obesity,  chronic indwelling foley, chronic pain medication, DVT, anxiety, depression, GERD, and anemia presents with complaints of generalized pain. During evaluation in the emergency department, patient was noted to be hypokalemic and hypomagnesemic with an increased lactic acid levels. UA was indicative of infection. Pt was admitted for further evaluation of a UTI. Urine cultures were collected and sent for further testing. She has been receiving IV Rocephin She will likely need a few more days of treatment.   Assessment & Plan:   Principal Problem:   UTI (lower urinary tract infection) Active Problems:   Morbid obesity (HCC)   Hypokalemia   Obstructive sleep apnea   Hypomagnesemia   Chronic pain disorder   QT prolongation   Functional quadriplegia (HCC)   1. UTI with hx of indwelling foley catheter. UA indicative of infection. Urine culture positive for multiple species, Blood culture shows no growth 2 days. Continue IV Rocephin.  2. Lactic acidosis. Likely due to volume depletion with poor PO intake and persistent  vomiting. Lactic acid is improving. Continue IVFs 3. Anemia. Hgb trending down possibly due to hemodilution. Platelets 129. No evidence of overt bleeding. Anemia panel shows normal iron and ferritin. Folate low. Continue to monitor. Will transfuse 1 unit prbc since she is hypotensive. No bruising noted in flanks. Exam limited due to body habitus. Will check CT abd to evaluate for internal bleeding. No bowel movements, making GI bleed unlikely. Check stool for occult blood. 4. Hyponatremia. Mildly low. Continue IV fluids 5. Hypokalemia. Resolved.  6. Hypomagnesemia. Magnesium replaced.  7. Chronic pain disorder. Noted. Adjust pain medications. 8. GERD. Continue PPI.  9. Obstructive sleep apnea. Started on CPAP.   10. Morbid obesity. Noted.  11.  Functional quadriplegia. Pt has been bed bound for almost a year now. She reports chronic issues with her neck and back. She also has severe neuropathy. May benefit from some physical therapy. She certainly is not a candidate for any invasive treatments. Her weakness//numbness/pain has been long standing and does not appear to be acutely changed. TSH and B12 unremarkable 12. History of hypertension. Hold losartan in the setting of low blood pressures 13. Hypoalbuminemia/Anasarca. Started on albumin infusions to help with blood pressures, since she is likely third spacing IV fluids. 14. Hypotension. Unclear etiology. Does not appear septic at this time. Check Echo to evaluate EF.  Check cortisol. Anemia could be playing a role. Will also decrease doses of pain medications.   DVT prophylaxis: SCDs Code Status: Full  Family Communication: discussed with sister at bedside  Disposition Plan: Discharge back to SNF once improved    Consultants:   None   Procedures:   None   Antimicrobials:   Rocephin 8/9 >>   Subjective: Feels weak. No shortness of breath. Continues to complain of generalized pain.  Objective: Vitals:   09/18/15 2211 09/18/15 2228 09/19/15 0100 09/19/15 0530  BP: (!) 93/55  95/60 (!) 95/51  Pulse: 97 98 96   Resp: Temp: 97.8 F (36.6 C)  98 F (36.7 C)   TempSrc: Oral  Axillary   SpO2: 96% 100% 100%   Weight:      Height:        Intake/Output Summary (Last 24 hours) at 09/19/15 0646 Last data filed at 09/19/15 0300  Gross per 24 hour  Intake          2151.67 ml  Output              200 ml  Net          1951.67 ml   Filed Weights   09/16/15 1853 09/17/15 0030  Weight: (!) 158.8 kg (350 lb) (!) 162.8 kg (359 lb)   Examination:  General exam: Appears calm and comfortable  Respiratory system: Clear to auscultation. Respiratory effort normal. Cardiovascular system: S1 & S2 heard, RRR. No JVD, murmurs, rubs,  gallops or clicks. 2-3+ pedal edema. Patient has generalized anasarca Gastrointestinal system: Abdomen is obese, soft and nontender. No organomegaly or masses felt. Normal bowel sounds heard. Pitting edema noted in abdominal wall Skin: No rashes, lesions or ulcers Psychiatry: Judgement and insight appear normal. Mood & affect appropriate.   Data Reviewed: I have personally reviewed following labs and imaging studies  CBC:  Recent Labs Lab 09/16/15 1914 09/17/15 0650 09/18/15 0526  WBC 11.0* 10.1 7.5  NEUTROABS 8.9*  --   --   HGB 10.0* 9.2* 8.5*  HCT 27.7* 25.9* 23.9*  MCV 102.6* 102.8* 104.8*  PLT 138* 127* 129*   Basic Metabolic Panel:  Recent Labs Lab 09/16/15 1914 09/17/15 0008 09/17/15 0650 09/18/15 0526  NA 126*  --  130* 128*  K 3.1*  --  3.6 3.6  CL 93*  --  95* 95*  CO2 24  --  26 26  GLUCOSE 96  --  87 105*  BUN 14  --  15 19  CREATININE 0.53  --  0.50 0.48  CALCIUM 6.9*  --  7.3* 7.4*  MG 1.1* 1.5*  --   --    GFR: Estimated Creatinine Clearance: 123.5 mL/min (by C-G formula based on SCr of 0.8 mg/dL). Liver Function Tests:  Recent Labs Lab 09/16/15 1914  AST 81*  ALT 50  ALKPHOS 115  BILITOT 3.1*  PROT 5.2*  ALBUMIN 1.7*    Recent Labs Lab 09/16/15 1914  LIPASE 12   Cardiac Enzymes:  Recent Labs Lab 09/16/15 1914  TROPONINI <0.03   Anemia Panel:  Recent Labs  09/16/15 1914  VITAMINB12 1,191*  FOLATE 3.6*  FERRITIN 749*  TIBC NOT CALCULATED  IRON 55  RETICCTPCT 2.2   Sepsis Labs:  Recent Labs Lab 09/16/15 1914 09/16/15 2030 09/16/15 2036 09/17/15 0008 09/18/15 0526  PROCALCITON 17.59  --   --   --  12.68  LATICACIDVEN  --  5.38* 5.1* 3.8*  --     Recent Results (from the past 240 hour(s))  Urine culture     Status: Abnormal   Collection Time: 09/16/15  7:00 PM  Result Value Ref Range Status   Specimen Description URINE, CLEAN CATCH  Final   Special Requests NONE  Final   Culture MULTIPLE SPECIES PRESENT,  SUGGEST RECOLLECTION (A)  Final   Report Status 09/18/2015 FINAL  Final  Blood culture (routine x 2)     Status: None (Preliminary result)   Collection Time: 09/16/15  8:25 PM  Result Value Ref Range Status   Specimen Description BLOOD PICC LINE DRAWN BY RN  Final   Special Requests BOTTLES DRAWN AEROBIC AND ANAEROBIC 6CC  Final   Culture NO GROWTH 2 DAYS  Final   Report Status PENDING  Incomplete  Blood culture (routine x 2)     Status: None (Preliminary result)   Collection Time: 09/16/15  8:36 PM  Result Value Ref Range Status  Specimen Description BLOOD LEFT HAND  Final   Special Requests BOTTLES DRAWN AEROBIC AND ANAEROBIC 6CC  Final   Culture NO GROWTH 2 DAYS  Final   Report Status PENDING  Incomplete    Scheduled Meds: . albumin human  25 g Intravenous Q6H  . aspirin EC  81 mg Oral Daily  . bisacodyl  10 mg Oral q morning - 10a  . calcium carbonate  1 tablet Oral TID WC  . cefTRIAXone (ROCEPHIN)  IV  2 g Intravenous Q24H  . diclofenac sodium  4 g Topical QID  . enoxaparin (LOVENOX) injection  80 mg Subcutaneous QHS  . feeding supplement  1 Container Oral Q24H  . feeding supplement (PRO-STAT SUGAR FREE 64)  30 mL Oral BID  . fentaNYL  150 mcg Transdermal Q72H  . magnesium oxide  400 mg Oral BID  . mupirocin ointment  1 application Nasal BID  . nystatin  5 mL Oral QID  . potassium chloride  20 mEq Oral TID  . sertraline  100 mg Oral QHS  . sodium chloride flush  10 mL Intravenous Q8H  . [START ON 09/27/2015] Vitamin D (Ergocalciferol)  50,000 Units Oral Q30 days   Continuous Infusions: . sodium chloride 100 mL/hr at 09/19/15 0011     LOS: 3 days    Time spent: 25 minutes     Erick Blinks, MD [facility] If 7PM-7AM, please contact night-coverage [email or web address] Password [facility] 09/19/2015, 6:46 AM

## 2015-09-19 NOTE — Progress Notes (Signed)
Patient has refused to wear her CPAP tonight due to being in a lot pain. Patient stated they have decreased her pain medications and she is more pain. Patient saturation on room air was 98% HR 107. Patient was placed on 1lpm [place] for support and will continue to be monitored. Patient nurse was made aware of the situation involving her CPAP.

## 2015-09-19 NOTE — Progress Notes (Signed)
Patient had nurse remove CPAP states," she will not wear any more tonight".

## 2015-09-20 ENCOUNTER — Inpatient Hospital Stay (HOSPITAL_BASED_OUTPATIENT_CLINIC_OR_DEPARTMENT_OTHER): Payer: Medicare Other

## 2015-09-20 DIAGNOSIS — I959 Hypotension, unspecified: Secondary | ICD-10-CM | POA: Diagnosis not present

## 2015-09-20 DIAGNOSIS — N390 Urinary tract infection, site not specified: Secondary | ICD-10-CM | POA: Diagnosis not present

## 2015-09-20 DIAGNOSIS — E872 Acidosis: Secondary | ICD-10-CM | POA: Diagnosis not present

## 2015-09-20 LAB — CBC
HCT: 21.4 % — ABNORMAL LOW (ref 36.0–46.0)
HEMOGLOBIN: 7.2 g/dL — AB (ref 12.0–15.0)
MCH: 35.8 pg — ABNORMAL HIGH (ref 26.0–34.0)
MCHC: 33.6 g/dL (ref 30.0–36.0)
MCV: 106.5 fL — ABNORMAL HIGH (ref 78.0–100.0)
Platelets: 110 10*3/uL — ABNORMAL LOW (ref 150–400)
RBC: 2.01 MIL/uL — AB (ref 3.87–5.11)
RDW: 23.6 % — ABNORMAL HIGH (ref 11.5–15.5)
WBC: 3.6 10*3/uL — AB (ref 4.0–10.5)

## 2015-09-20 LAB — BASIC METABOLIC PANEL
Anion gap: 5 (ref 5–15)
BUN: 15 mg/dL (ref 6–20)
CALCIUM: 7.8 mg/dL — AB (ref 8.9–10.3)
CO2: 27 mmol/L (ref 22–32)
Chloride: 104 mmol/L (ref 101–111)
Creatinine, Ser: 0.3 mg/dL — ABNORMAL LOW (ref 0.44–1.00)
GFR calc Af Amer: >60 mL/min (ref >60)
Glucose, Bld: 84 mg/dL (ref 65–99)
Potassium: 4 mmol/L (ref 3.5–5.1)
SODIUM: 136 mmol/L (ref 135–145)

## 2015-09-20 LAB — ECHOCARDIOGRAM COMPLETE
HEIGHTINCHES: 67 in
Weight: 5744 oz

## 2015-09-20 LAB — TYPE AND SCREEN
ABO/RH(D): B POS
ANTIBODY SCREEN: NEGATIVE

## 2015-09-20 LAB — PROCALCITONIN: PROCALCITONIN: 2.16 ng/mL

## 2015-09-20 LAB — MAGNESIUM: MAGNESIUM: 1.7 mg/dL (ref 1.7–2.4)

## 2015-09-20 LAB — ABO/RH: ABO/RH(D): B POS

## 2015-09-20 LAB — PREPARE RBC (CROSSMATCH)

## 2015-09-20 LAB — CORTISOL: CORTISOL PLASMA: 9.2 ug/dL

## 2015-09-20 MED ORDER — KETOROLAC TROMETHAMINE 30 MG/ML IJ SOLN
30.0000 mg | Freq: Four times a day (QID) | INTRAMUSCULAR | Status: AC
  Administered 2015-09-20 – 2015-09-22 (×7): 30 mg via INTRAVENOUS
  Filled 2015-09-20 (×7): qty 1

## 2015-09-20 MED ORDER — FOLIC ACID 5 MG/ML IJ SOLN
1.0000 mg | Freq: Every day | INTRAMUSCULAR | Status: DC
  Administered 2015-09-21: 1 mg via INTRAVENOUS
  Filled 2015-09-20 (×4): qty 0.2

## 2015-09-20 MED ORDER — HYDROMORPHONE HCL 1 MG/ML IJ SOLN
INTRAMUSCULAR | Status: AC
  Administered 2015-09-20: 1 mg
  Filled 2015-09-20: qty 1

## 2015-09-20 MED ORDER — HYDROMORPHONE HCL 1 MG/ML IJ SOLN
1.0000 mg | Freq: Once | INTRAMUSCULAR | Status: AC
  Administered 2015-09-20: 1 mg via INTRAVENOUS

## 2015-09-20 MED ORDER — SODIUM CHLORIDE 0.9 % IV SOLN: Freq: Once | INTRAVENOUS | Status: DC

## 2015-09-20 MED ORDER — HYDROMORPHONE HCL 1 MG/ML IJ SOLN
1.0000 mg | INTRAMUSCULAR | Status: DC | PRN
  Administered 2015-09-20 – 2015-09-22 (×8): 1 mg via INTRAVENOUS
  Filled 2015-09-20 (×8): qty 1

## 2015-09-20 MED ORDER — OXYCODONE HCL 5 MG PO TABS
10.0000 mg | ORAL_TABLET | Freq: Four times a day (QID) | ORAL | Status: DC | PRN
  Administered 2015-09-20 – 2015-09-22 (×5): 10 mg via ORAL
  Filled 2015-09-20 (×5): qty 2

## 2015-09-20 MED ORDER — FUROSEMIDE 10 MG/ML IJ SOLN
40.0000 mg | Freq: Two times a day (BID) | INTRAMUSCULAR | Status: DC
  Administered 2015-09-21 – 2015-09-23 (×5): 40 mg via INTRAVENOUS
  Filled 2015-09-20 (×5): qty 4

## 2015-09-20 NOTE — Progress Notes (Signed)
PROGRESS NOTE    [name]  ZOX:[id number] DOB: Jul 25, 1955 DOA: 09/16/2015 PCP: Josue Hector, MD    Brief Narrative:  20 yof with a hx of obesity,  chronic indwelling foley, chronic pain medication, DVT, anxiety, depression, GERD, and anemia, presented with complaints of generalized pain. During evaluation in the emergency department, patient was noted to be hypokalemic and hypomagnesemic with an increased lactic acid levels. UA was indicative of infection. Pt was admitted for further evaluation of a UTI. Urine cultures were collected and sent for further testing. Since her admission, both her potassium and magnesium levels have resolved. She has also been receiving IV Rocephin. Her Hgb was noted to be trending down so she was transfused with 1 unit of PRBC. Follow up ECHO for EF. She will likely need a few more days of treatment.   Assessment & Plan:   Principal Problem:   UTI (lower urinary tract infection) Active Problems:   Morbid obesity (HCC)   Anemia   Hypokalemia   Obstructive sleep apnea   Hypomagnesemia   Chronic pain disorder   QT prolongation   Functional quadriplegia (HCC)   Hypotension   Anasarca   1. UTI with hx of indwelling foley catheter. UA indicative of infection. Urine culture positive for multiple species, Blood culture shows no growth 2 days. Antibiotics have been discontinued.  2. Lactic acidosis. Likely due to volume depletion with poor PO intake and persistent vomiting. Lactic acid is improving. 3. Anemia. Hgb 7.2 trending down possibly due to hemodilution. Platelets 110. No evidence of overt bleeding. Anemia panel shows normal iron and ferritin. Folate low. Continue to monitor. Pt has received 1 unit of PRBC but Hgb is still trending down. No bruising noted in flanks. Exam limited due to body habitus. CT abd shows no evidence of bleed. No bowel movements, making GI bleed unlikely. Check stool for occult blood. Transfuse 1 more unit prbc today.  Recheck CBC in am. 4. Hyponatremia. Resolved 5. Hypokalemia. Resolved.  6. Hypomagnesemia. Magnesium replaced.  7. Chronic pain disorder. Noted. Pain medication has been adjusted. 8. GERD. Continue PPI.  9. Obstructive sleep apnea. Continue on CPAP.  10. Morbid obesity. Noted.  11.  Functional quadriplegia. Pt has been bed bound for almost a year now. She reports chronic issues with her neck and back. She also has severe neuropathy. May benefit from some physical therapy. She certainly is not a candidate for any invasive treatments. Her weakness//numbness/pain has been long standing and does not appear to be acutely changed. TSH and B12 unremarkable 12. History of hypertension. Hold losartan in the setting of low blood pressures 13. Hypoalbuminemia/Anasarca. Continue on albumin infusions to help with blood pressures, since she is likely third spacing IV fluids. Since blood pressure is improving today, will discontinue IVF and start IV lasix. 14. Hypotension. Improving today. Unclear etiology. Does not appear septic at this time. Check Echo to evaluate EF.  Check cortisol. Anemia could be playing a role. Will also decrease doses of pain medications.   DVT prophylaxis: SCDs Code Status: Full, patient and family are willing to meet with palliative care to discuss goals of care  Family Communication: discussed with sister at the bedside Disposition Plan: Discharge back to SNF once improved    Consultants:   None   Procedures:   Transfuse 1 unit PRBC   Antimicrobials:   Rocephin 8/9 >>8/13   Subjective: Complains of diffuse body pain. No shortness of breath  Objective: Vitals:   09/20/15 0322 09/20/15 0345  09/20/15 0500 09/20/15 0544  BP: 113/68 (!) 95/51 112/75 110/62  Pulse: (!) 102 (!) 104 (!) 102 (!) 103  Resp: 20  18 18   Temp: 97.8 F (36.6 C) 98.2 F (36.8 C) 97.5 F (36.4 C) 98 F (36.7 C)  TempSrc: Oral Oral Oral Oral  SpO2: 100% 99% 100% 100%  Weight:        Height:        Intake/Output Summary (Last 24 hours) at 09/20/15 0623 Last data filed at 09/20/15 0544  Gross per 24 hour  Intake            817.5 ml  Output             2000 ml  Net          -1182.5 ml   Filed Weights   09/16/15 1853 09/17/15 0030  Weight: (!) 158.8 kg (350 lb) (!) 162.8 kg (359 lb)   Examination:  General exam: Appears uncomfortable due to pain Respiratory system: Clear to auscultation. Respiratory effort normal. Cardiovascular system: S1 & S2 heard, RRR. No JVD, murmurs, rubs, gallops or clicks. 2-3+ pedal edema with anasarca. Gastrointestinal system: Abdomen is nondistended, soft and nontender. No organomegaly or masses felt. Normal bowel sounds heard. Central nervous system: Alert and oriented. No focal neurological deficits. Skin: No rashes, lesions or ulcers Psychiatry: Judgement and insight appear normal. Mood & affect appropriate.   Data Reviewed: I have personally reviewed following labs and imaging studies  CBC:  Recent Labs Lab 09/16/15 1914 09/17/15 0650 09/18/15 0526 09/19/15 0640  WBC 11.0* 10.1 7.5 4.5  NEUTROABS 8.9*  --   --   --   HGB 10.0* 9.2* 8.5* 7.3*  HCT 27.7* 25.9* 23.9* 21.2*  MCV 102.6* 102.8* 104.8* 107.1*  PLT 138* 127* 129* 122*   Basic Metabolic Panel:  Recent Labs Lab 09/16/15 1914 09/17/15 0008 09/17/15 0650 09/18/15 0526 09/19/15 0640  NA 126*  --  130* 128* 129*  K 3.1*  --  3.6 3.6 3.6  CL 93*  --  95* 95* 98*  CO2 24  --  26 26 26   GLUCOSE 96  --  87 105* 103*  BUN 14  --  15 19 20   CREATININE 0.53  --  0.50 0.48 0.41*  CALCIUM 6.9*  --  7.3* 7.4* 7.7*  MG 1.1* 1.5*  --   --   --    GFR: Estimated Creatinine Clearance: 123.5 mL/min (by C-G formula based on SCr of 0.8 mg/dL). Liver Function Tests:  Recent Labs Lab 09/16/15 1914  AST 81*  ALT 50  ALKPHOS 115  BILITOT 3.1*  PROT 5.2*  ALBUMIN 1.7*    Recent Labs Lab 09/16/15 1914  LIPASE 12   Cardiac Enzymes:  Recent Labs Lab  09/16/15 1914  TROPONINI <0.03   Anemia Panel:  Recent Labs  09/19/15 0640  VITAMINB12 618   Sepsis Labs:  Recent Labs Lab 09/16/15 1914 09/16/15 2030 09/16/15 2036 09/17/15 0008 09/18/15 0526  PROCALCITON 17.59  --   --   --  12.68  LATICACIDVEN  --  5.38* 5.1* 3.8*  --     Recent Results (from the past 240 hour(s))  Urine culture     Status: Abnormal   Collection Time: 09/16/15  7:00 PM  Result Value Ref Range Status   Specimen Description URINE, CLEAN CATCH  Final   Special Requests NONE  Final   Culture MULTIPLE SPECIES PRESENT, SUGGEST RECOLLECTION (A)  Final   Report  Status 09/18/2015 FINAL  Final  Blood culture (routine x 2)     Status: None (Preliminary result)   Collection Time: 09/16/15  8:25 PM  Result Value Ref Range Status   Specimen Description BLOOD PICC LINE DRAWN BY RN  Final   Special Requests BOTTLES DRAWN AEROBIC AND ANAEROBIC 6CC  Final   Culture NO GROWTH 3 DAYS  Final   Report Status PENDING  Incomplete  Blood culture (routine x 2)     Status: None (Preliminary result)   Collection Time: 09/16/15  8:36 PM  Result Value Ref Range Status   Specimen Description BLOOD LEFT HAND  Final   Special Requests BOTTLES DRAWN AEROBIC AND ANAEROBIC 6CC  Final   Culture NO GROWTH 3 DAYS  Final   Report Status PENDING  Incomplete    Scheduled Meds: . sodium chloride   Intravenous Once  . albumin human  25 g Intravenous Q6H  . aspirin EC  81 mg Oral Daily  . bisacodyl  10 mg Oral q morning - 10a  . calcium carbonate  1 tablet Oral TID WC  . cefTRIAXone (ROCEPHIN)  IV  2 g Intravenous Q24H  . diclofenac sodium  4 g Topical QID  . feeding supplement  1 Container Oral Q24H  . feeding supplement (PRO-STAT SUGAR FREE 64)  30 mL Oral BID  . fentaNYL  100 mcg Transdermal Q72H  . magnesium oxide  400 mg Oral BID  . mupirocin ointment  1 application Nasal BID  . naloxegol oxalate  25 mg Oral Daily  . nystatin  5 mL Oral QID  . potassium chloride  20 mEq Oral  TID  . sertraline  100 mg Oral QHS  . sodium chloride flush  10 mL Intravenous Q8H  . [START ON 09/27/2015] Vitamin D (Ergocalciferol)  50,000 Units Oral Q30 days   Continuous Infusions: . sodium chloride 100 mL/hr at 09/20/15 0049     LOS: 4 days   Time spent: 25 minutes   Erick Blinks, MD [facility] If 7PM-7AM, please contact night-coverage [email or web address] Password [facility] 09/20/2015, 6:23 AM

## 2015-09-20 NOTE — Progress Notes (Signed)
*  PRELIMINARY RESULTS* Echocardiogram 2D Echocardiogram has been performed.  [name], [name] 09/20/2015, 10:38 AM

## 2015-09-20 NOTE — Progress Notes (Signed)
Patient refusing CPAP again tonight. Unit still at bedside. RN notified. Placed patient on 2 lpm nasal cannula.

## 2015-09-21 ENCOUNTER — Encounter (HOSPITAL_COMMUNITY): Payer: Self-pay | Admitting: Primary Care

## 2015-09-21 DIAGNOSIS — Z515 Encounter for palliative care: Secondary | ICD-10-CM

## 2015-09-21 DIAGNOSIS — Z7189 Other specified counseling: Secondary | ICD-10-CM

## 2015-09-21 LAB — TYPE AND SCREEN
ABO/RH(D): B POS
ANTIBODY SCREEN: NEGATIVE
UNIT DIVISION: 0
UNIT DIVISION: 0

## 2015-09-21 LAB — COMPREHENSIVE METABOLIC PANEL
ALK PHOS: 88 U/L (ref 38–126)
ALT: 42 U/L (ref 14–54)
AST: 58 U/L — ABNORMAL HIGH (ref 15–41)
Albumin: 2.5 g/dL — ABNORMAL LOW (ref 3.5–5.0)
Anion gap: 5 (ref 5–15)
BILIRUBIN TOTAL: 1.2 mg/dL (ref 0.3–1.2)
BUN: 13 mg/dL (ref 6–20)
CHLORIDE: 105 mmol/L (ref 101–111)
CO2: 29 mmol/L (ref 22–32)
Calcium: 8.1 mg/dL — ABNORMAL LOW (ref 8.9–10.3)
GLUCOSE: 86 mg/dL (ref 65–99)
Potassium: 4.3 mmol/L (ref 3.5–5.1)
SODIUM: 139 mmol/L (ref 135–145)
Total Protein: 5.2 g/dL — ABNORMAL LOW (ref 6.5–8.1)

## 2015-09-21 LAB — CULTURE, BLOOD (ROUTINE X 2)
CULTURE: NO GROWTH
Culture: NO GROWTH

## 2015-09-21 LAB — CBC
HEMATOCRIT: 24.6 % — AB (ref 36.0–46.0)
HEMOGLOBIN: 8.2 g/dL — AB (ref 12.0–15.0)
MCH: 35.3 pg — AB (ref 26.0–34.0)
MCHC: 33.3 g/dL (ref 30.0–36.0)
MCV: 106 fL — ABNORMAL HIGH (ref 78.0–100.0)
Platelets: 117 10*3/uL — ABNORMAL LOW (ref 150–400)
RBC: 2.32 MIL/uL — ABNORMAL LOW (ref 3.87–5.11)
RDW: 23 % — AB (ref 11.5–15.5)
WBC: 4.4 10*3/uL (ref 4.0–10.5)

## 2015-09-21 MED ORDER — FOLIC ACID 1 MG PO TABS
1.0000 mg | ORAL_TABLET | Freq: Every day | ORAL | Status: DC
  Administered 2015-09-22 – 2015-09-23 (×2): 1 mg via ORAL
  Filled 2015-09-21 (×2): qty 1

## 2015-09-21 NOTE — Care Management Important Message (Signed)
Important Message  Patient Details  Name: [name] MRN: [id number] Date of Birth: 10/29/1955   Medicare Important Message Given:  Yes    [name], Stana Bayon Demske, RN 09/21/2015, 3:23 PM

## 2015-09-21 NOTE — Progress Notes (Signed)
Patient has been refusing cpap. Machine still in room if needed. Patient is on [facility] and tolerating well.

## 2015-09-21 NOTE — Consult Note (Signed)
Consultation Note Date: 09/21/2015   Patient Name: [name]  DOB: 10/08/1955  MRN: [id number]  Age / Sex: 60 y.o., female  PCP: Donna CatchingLeonard Nyland, MD Referring Physician: Erick BlinksJehanzeb Memon, MD  Reason for Consultation: Disposition, Establishing goals of care, Hospice Evaluation, Pain control and Psychosocial/spiritual support  HPI/Patient Profile: 60 y.o. female  with past medical history of Hypertension, obesity, osteoarthritis, thoracic or lumbosacral neuritis, lumbago, Gerd, anxiety and depression, SNF resident for 4 years admitted on 09/16/2015 with UTI with increased lactic acid level, low mag and potassium levels.   Clinical Assessment and Goals of Care: [name] is resting quietly in bed, she is sleeping soundly. At bedside today are daughter [name], "[name]", and sister [name]. [name] and [name] wake Ms. [name]. We talk about her current health concerns both chronic and acute. She shares that [facility]. Family states that she had a decline prior to being a resident of the SNF, was spending most of her time in a chair or bed. [name] was able to care for her mother at home for about 3 months before she needed to be placed. I share the chronic illness pathway diagram. [name] states she has a brother but he is not involved in [name]'s care.  We talk about symptom management today. [name] shares that at worst her pain has been 10/12, at best 10/10.  [name] shares that she is concerned, "I know I'm growing more dependent on my family, and that bothers me".  We talk about the balance between treating the treatable, and managing her pain. I share that our concern that pain management is lowering her blood pressure and heart rate. We talk about healthcare power of attorney and advanced directives. [name] shares that she would like for her daughter  [name] and sister [name] to make decisions for her future, stating that it is okay for them to make decisions if she is unable. I ask [name] where she would like to be when her time comes to pass. She states that she would not want to be in any of her family's home, stating that that would be too difficult for them. She states that it would be okay for her to die either in the hospital or [facility]. She goes on to talk about most of her family visiting her in the hospital yesterday. She states that she doesn't remember all of it, "I just hurt so bad" she states.  We talk about the realities of chest compression and CPR. At 1st she is unable to make a decision. She states that she gives the family the right to decide for her. Later during our discussion she nods her head affirmatively when I discussed the concepts of allow a natural death. Family is in agreement. Durable DNR completed   We talk about returning to [facility], and that she would qualify for the benefits of hospice. [name] readily agrees to the benefits of [facility]. She states her goal is to "go as peaceful as possible". We talk about the benefits of hospice focusing on comfort and dignity, while also supporting her family. [name]'s shares that they had completed and advanced directives 3 years ago, which stated her mother would not want life-support or feeding tube. Family is tearful at times. We talk about [name]'s weakness, her poor nutrition with albumin 1.5. Our concerns over her blood, and that she is not shown enough for any surgeries, chemo, or difficult medical treatments.  Family meeting to be held again tomorrow 8/15 at 0900.  Health care power of atty.  NEXT OF KIN -  [name] states she would like for her daughter, Crystal "[name]" and sister [name] to make choices together.   SUMMARY OF RECOMMENDATIONS   continue to treat the treatable at this time, allowing natural death. We discussed the idea  of balancing treatment with pain management. I share that I expect in the future the balance will be more toward comfort and dignity. We'll talk with family and patient tomorrow regarding which hospice they prefer at [facility].  Code Status/Advance Care Planning:  DNR - we discuss the concept of allow a natural death.   Symptom Management:   Per hospitalist, we discuss the balance between treating illness and managing pain.   Palliative Prophylaxis:   Aspiration, Frequent Pain Assessment and Turn Reposition  Additional Recommendations (Limitations, Scope, Preferences):  At this point, continue to treat the treatable, but no extraordinary measures. Family meeting is scheduled again tomorrow 8/15 at 9 AM. We discuss the balance between treating and comfort.  Psycho-social/Spiritual:   Desire for further Chaplaincy support:no  Additional Recommendations: Caregiving  Support/Resources and Education on Hospice  Prognosis:   < 3 months likely, based on Chronic illness burden, failure to thrive, immobility, low albumin of 1.5.  Discharge Planning: [name] agrees to return to her residential SNF, [facility], with the benefits of hospice.      Primary Diagnoses: Present on Admission: . Obstructive sleep apnea . Hypokalemia . Hypomagnesemia . Morbid obesity (HCC) . Chronic pain disorder . UTI (lower urinary tract infection) . QT prolongation . Functional quadriplegia (HCC) . Anemia . Hypotension . Anasarca   I have reviewed the medical record, interviewed the patient and family, and examined the patient. The following aspects are pertinent.  Past Medical History:  Diagnosis Date  . Anxiety   . Depression   . DVT (deep venous thrombosis) (HCC)   . Facet syndrome, lumbar   . GERD (gastroesophageal reflux disease)   . Hypertension   . Lumbago   . 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 Obesity   . Primary localized osteoarthrosis,  lower leg   . Thoracic or lumbosacral neuritis or radiculitis, unspecified    parapersis   Social History   Social History  . Marital status: Married    Spouse name: N/A  . Number of children: 2  . Years of education: N/A   Occupational History  . DISALBED Unemployed   Social History Main Topics  . Smoking status: Never Smoker  . Smokeless tobacco: Never Used  . Alcohol use No  . Drug use: No  . Sexual activity: No   Other Topics Concern  . None   Social History Narrative  . None   Family History  Problem Relation Age of Onset  . Asthma Mother   . COPD Mother   . Heart disease Mother   .  Heart disease Father   . Colon cancer Neg Hx    Scheduled Meds: . sodium chloride   Intravenous Once  . sodium chloride   Intravenous Once  . aspirin EC  81 mg Oral Daily  . bisacodyl  10 mg Oral q morning - 10a  . calcium carbonate  1 tablet Oral TID WC  . diclofenac sodium  4 g Topical QID  . feeding supplement  1 Container Oral Q24H  . feeding supplement (PRO-STAT SUGAR FREE 64)  30 mL Oral BID  . fentaNYL  100 mcg Transdermal Q72H  . folic acid  1 mg Intravenous Daily  . furosemide  40 mg Intravenous BID  . ketorolac  30 mg Intravenous Q6H  . magnesium oxide  400 mg Oral BID  . mupirocin ointment  1 application Nasal BID  . naloxegol oxalate  25 mg Oral Daily  . nystatin  5 mL Oral QID  . potassium chloride  20 mEq Oral TID  . sertraline  100 mg Oral QHS  . sodium chloride flush  10 mL Intravenous Q8H  . [START ON 09/27/2015] Vitamin D (Ergocalciferol)  50,000 Units Oral Q30 days   Continuous Infusions:  PRN Meds:.ALPRAZolam, alum & mag hydroxide-simeth, HYDROmorphone (DILAUDID) injection, nitroGLYCERIN, oxyCODONE, promethazine, promethazine Medications Prior to Admission:  Prior to Admission medications   Medication Sig Start Date End Date Taking? Authorizing Provider  acetaminophen (TYLENOL) 500 MG tablet Take 1,000 mg by mouth every 8 (eight) hours as needed.   Yes  Historical Provider, MD  ALPRAZolam Prudy Feeler) 0.5 MG tablet Take 0.5 mg by mouth every 6 (six) hours as needed for anxiety.   Yes Historical Provider, MD  Alum Hydroxide-Mag Carbonate (GAVISCON EXTRA STRENGTH) 160-105 MG CHEW Chew 2 tablets by mouth 4 (four) times daily as needed (acid reflux).   Yes Historical Provider, MD  Amino Acids-Protein Hydrolys (FEEDING SUPPLEMENT, PRO-STAT SUGAR FREE 64,) LIQD Take 30 mLs by mouth 2 (two) times daily.   Yes Historical Provider, MD  aspirin EC 81 MG tablet Take 81 mg by mouth daily.   Yes Historical Provider, MD  bisacodyl (DULCOLAX) 5 MG EC tablet Take 10 mg by mouth every morning.   Yes Historical Provider, MD  calcium carbonate (TUMS - DOSED IN MG ELEMENTAL CALCIUM) 500 MG chewable tablet Chew 1 tablet by mouth 3 (three) times daily with meals.    Yes Historical Provider, MD  esomeprazole (NEXIUM) 40 MG capsule Take 40 mg by mouth 2 (two) times daily before a meal.   Yes Historical Provider, MD  feeding supplement (BOOST / RESOURCE BREEZE) LIQD Take 1 Container by mouth daily. 09/12/15  Yes Houston Siren, MD  fentaNYL (DURAGESIC - DOSED MCG/HR) 100 MCG/HR Place 100 mcg onto the skin every 3 (three) days.   Yes Historical Provider, MD  fentaNYL (DURAGESIC - DOSED MCG/HR) 50 MCG/HR Place 50 mcg onto the skin every 3 (three) days.   Yes Historical Provider, MD  heparin flush 10 UNIT/ML SOLN injection Inject 10 Units into the vein 3 (three) times daily.   Yes Historical Provider, MD  losartan (COZAAR) 25 MG tablet Take 25 mg by mouth daily.    Yes Historical Provider, MD  magnesium oxide (MAG-OX) 400 MG tablet Take 400 mg by mouth 2 (two) times daily.    Yes Historical Provider, MD  mupirocin ointment (BACTROBAN) 2 % Place 1 application into the nose 2 (two) times daily. 09/12/15  Yes Houston Siren, MD  naloxegol oxalate (MOVANTIK) 25 MG TABS tablet Take  25 mg by mouth daily.   Yes Historical Provider, MD  nitroGLYCERIN (NITROSTAT) 0.4 MG SL tablet Place 0.4 mg under the  tongue every 5 (five) minutes as needed for chest pain.   Yes Historical Provider, MD  nystatin (MYCOSTATIN) 100000 UNIT/ML suspension Take 5 mLs (500,000 Units total) by mouth 4 (four) times daily. 09/12/15  Yes Houston SirenPeter Le, MD  ondansetron (ZOFRAN-ODT) 8 MG disintegrating tablet Take 8 mg by mouth every 8 (eight) hours as needed for nausea or vomiting.   Yes Historical Provider, MD  Oxycodone HCl 10 MG TABS Take 10 mg by mouth every 6 (six) hours as needed (pain).   Yes Historical Provider, MD  potassium chloride (K-DUR,KLOR-CON) 10 MEQ tablet Take 20 mEq by mouth 3 (three) times daily.   Yes Historical Provider, MD  promethazine (PHENERGAN) 25 MG suppository Place 1 suppository rectally every 4 (four) hours as needed for nausea or vomiting.  07/08/15  Yes Historical Provider, MD  promethazine (PHENERGAN) 25 MG tablet Take 1 tablet by mouth every 4 (four) hours as needed for nausea or vomiting.  07/28/15  Yes Historical Provider, MD  ranitidine (ZANTAC) 75 MG tablet Take 75 mg by mouth 2 (two) times daily.   Yes Historical Provider, MD  sertraline (ZOLOFT) 100 MG tablet Take 100 mg by mouth at bedtime.   Yes Historical Provider, MD  Sodium Chloride Flush (NORMAL SALINE FLUSH) 0.9 % SOLN Inject 10 mLs into the vein 3 (three) times daily.   Yes Historical Provider, MD  torsemide (DEMADEX) 20 MG tablet Take 80 mg by mouth daily.   Yes Historical Provider, MD  Vitamin D, Ergocalciferol, (DRISDOL) 50000 units CAPS capsule Take 50,000 Units by mouth every 30 (thirty) days. Takes on the 20th of every month.   Yes Historical Provider, MD  saccharomyces boulardii (FLORASTOR) 250 MG capsule Take 250 mg by mouth daily.    Historical Provider, MD   Allergies  Allergen Reactions  . Sulfa Antibiotics Itching and Swelling   Review of Systems  Unable to perform ROS: Mental status change    Physical Exam  Constitutional:  Morbidly obese, chronically ill appearing, sleepy.   HENT:  Head: Normocephalic and  atraumatic.  Cardiovascular: Normal rate and regular rhythm.   3+ pitting edema BL LE  Pulmonary/Chest:  Mild respiratory distress, work of breathing noted, long pauses between sentances.   Abdominal: Soft. She exhibits distension.  Obese abdomen.   Neurological:  Long pauses between sentences,  Briefly makes, but not keeps eye contact. Oriented to self, wrong answer to place and time, but corrects within a minute.   Skin: Skin is warm and dry.  anasarca  Nursing note and vitals reviewed.   Vital Signs: BP (!) 94/59 (BP Location: Left Arm)   Pulse 96   Temp 97.8 F (36.6 C) (Oral)   Resp 20   Ht 5\' 7"  (1.702 m)   Wt (!) 162.8 kg (359 lb)   SpO2 100%   BMI 56.23 kg/m  Pain Assessment: 0-10 POSS *See Group Information*: 1-Acceptable,Awake and alert Pain Score: Asleep   SpO2: SpO2: 100 % O2 Device:SpO2: 100 % O2 Flow Rate: .O2 Flow Rate (L/min): 2 L/min  IO: Intake/output summary:  Intake/Output Summary (Last 24 hours) at 09/21/15 1218 Last data filed at 09/21/15 0924  Gross per 24 hour  Intake              592 ml  Output  400 ml  Net              192 ml    LBM: Last BM Date: 09/17/15 Baseline Weight: Weight: (!) 158.8 kg (350 lb) Most recent weight: Weight: (!) 162.8 kg (359 lb)     Palliative Assessment/Data:   Flowsheet Rows   Flowsheet Row Most Recent Value  Intake Tab  Referral Department  Hospitalist  Unit at Time of Referral  Med/Surg Unit  Palliative Care Primary Diagnosis  Pulmonary  Date Notified  09/20/15  Palliative Care Type  New Palliative care  Reason for referral  Clarify Goals of Care  Date of Admission  09/16/15  Date first seen by Palliative Care  09/21/15  # of days Palliative referral response time  1 Day(s)  # of days IP prior to Palliative referral  4  Clinical Assessment  Palliative Performance Scale Score  30%  Pain Max last 24 hours  Other (Comment) [12/10]  Pain Min Last 24 hours  10  Dyspnea Max Last 24 Hours  Not  able to report  Dyspnea Min Last 24 hours  Not able to report  Psychosocial & Spiritual Assessment  Palliative Care Outcomes  Patient/Family meeting held?  Yes  Who was at the meeting?  Patient, daughter Donna Cosier "[name]", sister [name].  Palliative Care Outcomes  Clarified goals of care, Changed CPR status, Completed durable DNR, Provided advance care planning, Counseled regarding hospice, Transitioned to hospice  Patient/Family wishes: Interventions discontinued/not started   Mechanical Ventilation, Tube feedings/TPN, PEG  Palliative Care follow-up planned  -- [Follow-up while at [facility]]      Time In: 1010 Time Out: 1120 Time Total: 70 minutes Greater than 50%  of this time was spent counseling and coordinating care related to the above assessment and plan.  Signed by: Katheran Awe, NP   Please contact Palliative Medicine Team phone at [phone] for questions and concerns.  For individual provider: See Loretha Stapler

## 2015-09-21 NOTE — Progress Notes (Signed)
PROGRESS NOTE    Donna QuailDebbie J Townsend  VWU:[id number] DOB: 08/30/1955 DOA: 09/16/2015 PCP: Josue HectorNYLAND,LEONARD ROBERT, MD    Brief Narrative:  [age] yof with a hx of obesity,  chronic indwelling foley, chronic pain medication, DVT, anxiety, depression, GERD, and anemia, presented with complaints of generalized pain. During evaluation in the emergency department, patient was noted to be hypokalemic and hypomagnesemic with an increased lactic acid levels. UA was indicative of infection. Pt was admitted for further evaluation of a UTI. Urine cultures were collected and sent for further testing. Since her admission, both her potassium and magnesium levels have normalized. She has completed a course of Rocephin. Her Hgb is stable s/p 2 units pRBCs. ECHO returned normal with EF 60-65%.  Assessment & Plan:   Principal Problem:   UTI (lower urinary tract infection) Active Problems:   Morbid obesity (HCC)   Anemia   Hypokalemia   Obstructive sleep apnea   Hypomagnesemia   Chronic pain disorder   QT prolongation   Functional quadriplegia (HCC)   Hypotension   Anasarca   1. UTI with hx of indwelling foley catheter. Blood culture shows no growth 2 days. Antibiotics have been discontinued.  2. Lactic acidosis. Likely due to volume depletion with poor PO intake and persistent vomiting. Lactic acid is improving. 3. Anemia, possibly due to hemodilution. Platelets 110. No evidence of overt bleeding. Anemia panel shows normal iron and ferritin. Folate low. Continue to monitor. Hgb stable s/p 2 units pRBCs. No bruising noted in flanks. Exam limited due to body habitus. CT abd shows no evidence of bleed. No bowel movements, making GI bleed unlikely. Check stool for occult blood.  4. Hyponatremia. Resolved 5. Hypokalemia. Resolved.  6. Hypomagnesemia. Magnesium replaced.  7. Chronic pain disorder. Noted. Pain medication has been adjusted. 8. GERD. Continue PPI.  9. Obstructive sleep apnea. Continue on CPAP.   10. Morbid obesity. Noted.  11.  Functional quadriplegia. Pt has been bed bound for almost a year now. She reports chronic issues with her neck and back. She also has severe neuropathy. May benefit from some physical therapy. She certainly is not a candidate for any invasive treatments. Her weakness//numbness/pain has been long standing and does not appear to be acutely changed. TSH and B12 unremarkable 12. History of hypertension. Hold losartan in the setting of low blood pressures 13. Hypoalbuminemia/Anasarca. Continue on albumin infusions to help with blood pressures, since she is likely third spacing IV fluids. Since blood pressure is improving, IVFs have been discontinued and she has been started on lasix. 14. Hypotension. Improving today. Unclear etiology. Does not appear septic at this time. ECHO EF normal at 60-65%.  Cortisol normal. Anemia could be playing a role. Pain medication doses have been decreased.    DVT prophylaxis: SCDs Code Status: DNR Family Communication: palliative care has discussed patient's condition with patient and family and they have elected DNR status and to use hospice services on return to SNF. Continue current treatments for now Disposition Plan: Discharge back to SNF once improved    Consultants:   Palliative care.  Procedures:   Transfuse 1 unit PRBC   Transfuse 1 unit pRBC 8/14  ECHO Study Conclusions  - Left ventricle: The cavity size was normal. Wall thickness was   normal. Systolic function was normal. The estimated ejection   fraction was in the range of 60% to 65%. Wall motion was normal;   there were no regional wall motion abnormalities. Left   ventricular diastolic function parameters were normal. -  Aortic valve: Valve area (VTI): 2.98 cm^2. Valve area (Vmax):   2.67 cm^2. - Atrial septum: No defect or patent foramen ovale was identified. - Pulmonary arteries: Systolic pressure was moderately increased.   PA peak pressure: 45 mm Hg  (S). - Technically adequate study.  Antimicrobials:   Rocephin 8/9 >>8/13   Subjective: Having some shortness of breath. Still having pain.  Objective: Vitals:   09/20/15 1815 09/20/15 2112 09/20/15 2223 09/21/15 0630  BP: (!) 98/55 (!) 107/57 109/65 (!) 94/59  Pulse: (!) 101 (!) 102 100 96  Resp: 18 18 18 20   Temp: 97.8 F (36.6 C) 98.3 F (36.8 C) 97.5 F (36.4 C) 97.8 F (36.6 C)  TempSrc:  Axillary Oral Oral  SpO2:  100% 100% 100%  Weight:      Height:        Intake/Output Summary (Last 24 hours) at 09/21/15 0646 Last data filed at 09/20/15 2223  Gross per 24 hour  Intake              352 ml  Output              400 ml  Net              -48 ml   Filed Weights   09/16/15 1853 09/17/15 0030  Weight: (!) 158.8 kg (350 lb) (!) 162.8 kg (359 lb)   Examination:  General exam: Appears calm and comfortable  Respiratory system: Clear to auscultation. Respiratory effort normal. Cardiovascular system: S1 & S2 heard, RRR. No JVD, murmurs, rubs, gallops or clicks. 3+ pedal edema with anasarca. Gastrointestinal system: Abdomen is nondistended, soft and nontender. No organomegaly or masses felt. Normal bowel sounds heard. Central nervous system: Alert and oriented. No focal neurological deficits. Skin: No rashes, lesions or ulcers Psychiatry: Judgement and insight appear normal. Mood & affect appropriate.   Data Reviewed: I have personally reviewed following labs and imaging studies  CBC:  Recent Labs Lab 09/16/15 1914 09/17/15 0650 09/18/15 0526 09/19/15 0640 09/20/15 0808 09/21/15 0531  WBC 11.0* 10.1 7.5 4.5 3.6* PENDING  NEUTROABS 8.9*  --   --   --   --   --   HGB 10.0* 9.2* 8.5* 7.3* 7.2* 8.2*  HCT 27.7* 25.9* 23.9* 21.2* 21.4* 24.6*  MCV 102.6* 102.8* 104.8* 107.1* 106.5* 106.0*  PLT 138* 127* 129* 122* 110* PENDING   Basic Metabolic Panel:  Recent Labs Lab 09/16/15 1914 09/17/15 0008 09/17/15 0650 09/18/15 0526 09/19/15 0640 09/20/15 0808  09/21/15 0531  NA 126*  --  130* 128* 129* 136 139  K 3.1*  --  3.6 3.6 3.6 4.0 4.3  CL 93*  --  95* 95* 98* 104 105  CO2 24  --  26 26 26 27 29   GLUCOSE 96  --  87 105* 103* 84 86  BUN 14  --  15 19 20 15 13   CREATININE 0.53  --  0.50 0.48 0.41* 0.30* <0.30*  CALCIUM 6.9*  --  7.3* 7.4* 7.7* 7.8* 8.1*  MG 1.1* 1.5*  --   --   --  1.7  --    GFR: CrCl cannot be calculated (This lab value cannot be used to calculate CrCl because it is not a number: <0.30). Liver Function Tests:  Recent Labs Lab 09/16/15 1914 09/21/15 0531  AST 81* 58*  ALT 50 42  ALKPHOS 115 88  BILITOT 3.1* 1.2  PROT 5.2* 5.2*  ALBUMIN 1.7* 2.5*    Recent Labs  Lab 09/16/15 1914  LIPASE 12   Cardiac Enzymes:  Recent Labs Lab 09/16/15 1914  TROPONINI <0.03   Anemia Panel:  Recent Labs  09/19/15 0640  VITAMINB12 618   Sepsis Labs:  Recent Labs Lab 09/16/15 1914 09/16/15 2030 09/16/15 2036 09/17/15 0008 09/18/15 0526 09/20/15 0808  PROCALCITON 17.59  --   --   --  12.68 2.16  LATICACIDVEN  --  5.38* 5.1* 3.8*  --   --     Recent Results (from the past 240 hour(s))  Urine culture     Status: Abnormal   Collection Time: 09/16/15  7:00 PM  Result Value Ref Range Status   Specimen Description URINE, CLEAN CATCH  Final   Special Requests NONE  Final   Culture MULTIPLE SPECIES PRESENT, SUGGEST RECOLLECTION (A)  Final   Report Status 09/18/2015 FINAL  Final  Blood culture (routine x 2)     Status: None (Preliminary result)   Collection Time: 09/16/15  8:25 PM  Result Value Ref Range Status   Specimen Description BLOOD PICC LINE DRAWN BY RN  Final   Special Requests BOTTLES DRAWN AEROBIC AND ANAEROBIC 6CC  Final   Culture NO GROWTH 3 DAYS  Final   Report Status PENDING  Incomplete  Blood culture (routine x 2)     Status: None (Preliminary result)   Collection Time: 09/16/15  8:36 PM  Result Value Ref Range Status   Specimen Description BLOOD LEFT HAND  Final   Special Requests  BOTTLES DRAWN AEROBIC AND ANAEROBIC 6CC  Final   Culture NO GROWTH 3 DAYS  Final   Report Status PENDING  Incomplete    Scheduled Meds: . sodium chloride   Intravenous Once  . sodium chloride   Intravenous Once  . aspirin EC  81 mg Oral Daily  . bisacodyl  10 mg Oral q morning - 10a  . calcium carbonate  1 tablet Oral TID WC  . diclofenac sodium  4 g Topical QID  . feeding supplement  1 Container Oral Q24H  . feeding supplement (PRO-STAT SUGAR FREE 64)  30 mL Oral BID  . fentaNYL  100 mcg Transdermal Q72H  . folic acid  1 mg Intravenous Daily  . furosemide  40 mg Intravenous BID  . ketorolac  30 mg Intravenous Q6H  . magnesium oxide  400 mg Oral BID  . mupirocin ointment  1 application Nasal BID  . naloxegol oxalate  25 mg Oral Daily  . nystatin  5 mL Oral QID  . potassium chloride  20 mEq Oral TID  . sertraline  100 mg Oral QHS  . sodium chloride flush  10 mL Intravenous Q8H  . [START ON 09/27/2015] Vitamin D (Ergocalciferol)  50,000 Units Oral Q30 days   Continuous Infusions:     LOS: 5 days   Time spent: 25 minutes   Erick BlinksJehanzeb Shanai Lartigue, MD [facility] If 7PM-7AM, please contact night-coverage [email or web address] Password [email or web address] 09/21/2015, 6:46 AM

## 2015-09-22 LAB — CBC
HEMATOCRIT: 25.7 % — AB (ref 36.0–46.0)
Hemoglobin: 8.4 g/dL — ABNORMAL LOW (ref 12.0–15.0)
MCH: 35.6 pg — ABNORMAL HIGH (ref 26.0–34.0)
MCHC: 32.7 g/dL (ref 30.0–36.0)
MCV: 108.9 fL — AB (ref 78.0–100.0)
PLATELETS: 162 10*3/uL (ref 150–400)
RBC: 2.36 MIL/uL — AB (ref 3.87–5.11)
RDW: 22.4 % — ABNORMAL HIGH (ref 11.5–15.5)
WBC: 5.5 10*3/uL (ref 4.0–10.5)

## 2015-09-22 LAB — BASIC METABOLIC PANEL
ANION GAP: 5 (ref 5–15)
BUN: 13 mg/dL (ref 6–20)
CHLORIDE: 105 mmol/L (ref 101–111)
CO2: 30 mmol/L (ref 22–32)
Calcium: 8.1 mg/dL — ABNORMAL LOW (ref 8.9–10.3)
Creatinine, Ser: 0.33 mg/dL — ABNORMAL LOW (ref 0.44–1.00)
GFR calc non Af Amer: >60 mL/min (ref >60)
Glucose, Bld: 83 mg/dL (ref 65–99)
POTASSIUM: 4.4 mmol/L (ref 3.5–5.1)
SODIUM: 140 mmol/L (ref 135–145)

## 2015-09-22 MED ORDER — MORPHINE SULFATE (CONCENTRATE) 10 MG/0.5ML PO SOLN
10.0000 mg | ORAL | Status: DC | PRN
  Administered 2015-09-22 – 2015-09-23 (×13): 10 mg via SUBLINGUAL
  Filled 2015-09-22 (×13): qty 0.5

## 2015-09-22 MED ORDER — SALINE SPRAY 0.65 % NA SOLN
1.0000 | NASAL | Status: DC | PRN
  Administered 2015-09-22: 1 via NASAL
  Filled 2015-09-22: qty 44

## 2015-09-22 MED ORDER — FENTANYL 50 MCG/HR TD PT72: 50.0000 ug | MEDICATED_PATCH | TRANSDERMAL | Status: DC

## 2015-09-22 MED ORDER — FENTANYL 75 MCG/HR TD PT72
150.0000 ug | MEDICATED_PATCH | TRANSDERMAL | Status: DC
  Administered 2015-09-22: 150 ug via TRANSDERMAL
  Filled 2015-09-22: qty 2

## 2015-09-22 MED ORDER — MILK AND MOLASSES ENEMA
1.0000 | Freq: Once | RECTAL | Status: AC
  Administered 2015-09-22: 250 mL via RECTAL

## 2015-09-22 MED ORDER — MORPHINE SULFATE (CONCENTRATE) 10 MG/0.5ML PO SOLN: 10.0000 mg | ORAL | Status: DC | PRN

## 2015-09-22 NOTE — Progress Notes (Signed)
PROGRESS NOTE    [name]  ZOX:[id number] DOB: 03/08/1955 DOA: 09/16/2015 PCP: Josue Hector, MD   Brief Narrative:  21 yof with a hx of obesity, chronic indwelling foley, bed bound, chronic pain medication, DVT, anxiety, depression, GERD, and anemia, presented with complaints of generalized pain. During evaluation in the emergency department, patient was noted to be hypokalemic and hypomagnesemic with an increased lactic acid levels. UA was indicative of infection. She was treated with rocephin, but urine cultures did not show any specific growth. Patient is also noted to be massively volume overloaded with anasarca, related to hypoalbuminemia. Diuresing her has been challenging due to hypotension. Her low blood pressures are likely related to her pain medications. Patient has chronic pain and is on large doses of narcotics. Overall prognosis is poor. Palliative care following and patient is now DNR, with the possibility that patient/family will elect to transition to full comfort care in the next day or two. Will continue with current treatments. Would not escalate care if patient starts to decline. If patient stabilizes and returns to SNF, she will likely have hospice follow her at [facility].   Assessment & Plan:   Principal Problem:   UTI (lower urinary tract infection) Active Problems:   Morbid obesity (HCC)   Anemia   Hypokalemia   Obstructive sleep apnea   Hypomagnesemia   Chronic pain disorder   QT prolongation   Functional quadriplegia (HCC)   Hypotension   Anasarca   Palliative care encounter   Goals of care, counseling/discussion   DNR (do not resuscitate) discussion  1. UTI with hx of indwelling foley catheter. Blood culture shows no growth 2 days. Urine culture shows nonspecific growth. Antibiotics have been discontinued. . 2. Anemia, possibly due to hemodilution. Patient was hydrated at the time of admission. No evidence of overt bleeding. Anemia panel shows  normal iron and ferritin. Folate low. Continue to monitor. Hgb stable s/p 2 units pRBCs. No bruising noted in flanks. CT abd shows no evidence of internal bleed. No bowel movements, making GI bleed unlikely. Check stool for occult blood.  3. Hyponatremia. Resolved 4. Hypokalemia. Resolved.  5. Hypomagnesemia. Magnesium replaced.  6. Chronic pain disorder. Noted. Pain medication has been adjusted. 7. GERD. Continue PPI.  8. Obstructive sleep apnea. Continue on CPAP.  9. Morbid obesity. Noted.  10.  Functional quadriplegia. Pt has been bed bound for almost a year now. She reports chronic issues with her neck and back. She also has severe neuropathy. May benefit from some physical therapy. She certainly is not a candidate for any invasive treatments. Her weakness//numbness/pain has been long standing and does not appear to be acutely changed. TSH and B12 unremarkable 11. History of hypertension. Hold losartan in the setting of low blood pressures 12. Hypoalbuminemia/Anasarca. Albumin 1.5 on admission. She recieved albumin infusions to help with blood pressures, since she was likely third spacing IV fluids. She has now been started on IV lasix for diuresis and has fair urine output. Continue current treatments.. 13. Hypotension. Improving today. Unclear etiology. Does not appear septic at this time. ECHO EF normal at 60-65%. Cortisol normal. Likely related to high dose of pain medications. 14. Discussion. Patient has had poor functional capacity for quite some time. She is bed bound, has poor nutritional status and is in overall poor health. Palliative care is following patient and after discussions with patient and family, it was decided that patient would be DNR. Family wishes to continue high doses of pain medications, even if  that means that patient may become hypotensive. They also wish to continue with diuresis for now, since improving anasarca may make patient feel better. They will likely eventually  transition to full comfort care. Plan is to likely discharge back to SNF with hospice services once stable.     DVT prophylaxis: SCDs Code Status: DNR  Family Communication: discussed with sister bedside Disposition Plan: Discharge back to SNF with hospice services once improved    Consultants:   Palliative care   Procedures:   Transfuse 1 unit PRBC 8/13  Transfuse 1 unit pRBC 8/14  ECHO Study Conclusions  - Left ventricle: The cavity size was normal. Wall thickness was normal. Systolic function was normal. The estimated ejection fraction was in the range of 60% to 65%. Wall motion was normal; there were no regional wall motion abnormalities. Left ventricular diastolic function parameters were normal. - Aortic valve: Valve area (VTI): 2.98 cm^2. Valve area (Vmax): 2.67 cm^2. - Atrial septum: No defect or patent foramen ovale was identified. - Pulmonary arteries: Systolic pressure was moderately increased. PA peak pressure: 45 mm Hg (S). - Technically adequate study.  Antimicrobials:   Rocephin 8/9 >> 8/13   Subjective: Feels that swelling may be a little better. Feels short of breath. Still in generalized pain.  Objective: Vitals:   09/21/15 2146 09/21/15 2350 09/22/15 0205 09/22/15 0642  BP: (!) 70/52 123/75 99/61 (!) 84/52  Pulse: (!) 106 86 (!) 102 (!) 106  Resp: 20  20 20   Temp: 97.8 F (36.6 C)  98.3 F (36.8 C) 98.1 F (36.7 C)  TempSrc: Oral  Axillary Oral  SpO2: 100%  100% 100%  Weight:      Height:        Intake/Output Summary (Last 24 hours) at 09/22/15 0702 Last data filed at 09/22/15 [id number]  Gross per 24 hour  Intake              600 ml  Output             3450 ml  Net            -2850 ml   Filed Weights   09/16/15 1853 09/17/15 0030  Weight: (!) 158.8 kg (350 lb) (!) 162.8 kg (359 lb)    Examination:  General exam: Appears calm and comfortable  Respiratory system: Crackles at bases. Respiratory effort  normal. Cardiovascular system: S1 & S2 heard, RRR. No JVD, murmurs, rubs, gallops or clicks. 2-3+ pedal edema with generalized anasarca. Gastrointestinal system: Abdomen is nondistended, soft and nontender. No organomegaly or masses felt. Normal bowel sounds heard. Central nervous system: Alert and oriented.  Skin: No rashes, lesions or ulcers Psychiatry: Judgement and insight appear normal. Mood & affect appropriate.     Data Reviewed: I have personally reviewed following labs and imaging studies  CBC:  Recent Labs Lab 09/16/15 1914 09/17/15 0650 09/18/15 0526 09/19/15 0640 09/20/15 0808 09/21/15 0531  WBC 11.0* 10.1 7.5 4.5 3.6* 4.4  NEUTROABS 8.9*  --   --   --   --   --   HGB 10.0* 9.2* 8.5* 7.3* 7.2* 8.2*  HCT 27.7* 25.9* 23.9* 21.2* 21.4* 24.6*  MCV 102.6* 102.8* 104.8* 107.1* 106.5* 106.0*  PLT 138* 127* 129* 122* 110* 117*   Basic Metabolic Panel:  Recent Labs Lab 09/16/15 1914 09/17/15 0008 09/17/15 0650 09/18/15 0526 09/19/15 0640 09/20/15 0808 09/21/15 0531  NA 126*  --  130* 128* 129* 136 139  K 3.1*  --  3.6  3.6 3.6 4.0 4.3  CL 93*  --  95* 95* 98* 104 105  CO2 24  --  26 26 26 27 29   GLUCOSE 96  --  87 105* 103* 84 86  BUN 14  --  15 19 20 15 13   CREATININE 0.53  --  0.50 0.48 0.41* 0.30* <0.30*  CALCIUM 6.9*  --  7.3* 7.4* 7.7* 7.8* 8.1*  MG 1.1* 1.5*  --   --   --  1.7  --    GFR: CrCl cannot be calculated (This lab value cannot be used to calculate CrCl because it is not a number: <0.30). Liver Function Tests:  Recent Labs Lab 09/16/15 1914 09/21/15 0531  AST 81* 58*  ALT 50 42  ALKPHOS 115 88  BILITOT 3.1* 1.2  PROT 5.2* 5.2*  ALBUMIN 1.7* 2.5*    Recent Labs Lab 09/16/15 1914  LIPASE 12   No results for input(s): AMMONIA in the last 168 hours. Coagulation Profile: No results for input(s): INR, PROTIME in the last 168 hours. Cardiac Enzymes:  Recent Labs Lab 09/16/15 1914  TROPONINI <0.03   BNP (last 3 results) No  results for input(s): PROBNP in the last 8760 hours. HbA1C: No results for input(s): HGBA1C in the last 72 hours. CBG: No results for input(s): GLUCAP in the last 168 hours. Lipid Profile: No results for input(s): CHOL, HDL, LDLCALC, TRIG, CHOLHDL, LDLDIRECT in the last 72 hours. Thyroid Function Tests: No results for input(s): TSH, T4TOTAL, FREET4, T3FREE, THYROIDAB in the last 72 hours. Anemia Panel: No results for input(s): VITAMINB12, FOLATE, FERRITIN, TIBC, IRON, RETICCTPCT in the last 72 hours. Sepsis Labs:  Recent Labs Lab 09/16/15 1914 09/16/15 2030 09/16/15 2036 09/17/15 0008 09/18/15 0526 09/20/15 0808  PROCALCITON 17.59  --   --   --  12.68 2.16  LATICACIDVEN  --  5.38* 5.1* 3.8*  --   --     Recent Results (from the past 240 hour(s))  Urine culture     Status: Abnormal   Collection Time: 09/16/15  7:00 PM  Result Value Ref Range Status   Specimen Description URINE, CLEAN CATCH  Final   Special Requests NONE  Final   Culture MULTIPLE SPECIES PRESENT, SUGGEST RECOLLECTION (A)  Final   Report Status 09/18/2015 FINAL  Final  Blood culture (routine x 2)     Status: None   Collection Time: 09/16/15  8:25 PM  Result Value Ref Range Status   Specimen Description BLOOD PICC LINE DRAWN BY RN  Final   Special Requests BOTTLES DRAWN AEROBIC AND ANAEROBIC 6CC  Final   Culture NO GROWTH 5 DAYS  Final   Report Status 09/21/2015 FINAL  Final  Blood culture (routine x 2)     Status: None   Collection Time: 09/16/15  8:36 PM  Result Value Ref Range Status   Specimen Description BLOOD LEFT HAND  Final   Special Requests BOTTLES DRAWN AEROBIC AND ANAEROBIC 6CC  Final   Culture NO GROWTH 5 DAYS  Final   Report Status 09/21/2015 FINAL  Final         Radiology Studies: No results found.      Scheduled Meds: . sodium chloride   Intravenous Once  . sodium chloride   Intravenous Once  . aspirin EC  81 mg Oral Daily  . bisacodyl  10 mg Oral q morning - 10a  . calcium  carbonate  1 tablet Oral TID WC  . diclofenac sodium  4 g  Topical QID  . feeding supplement  1 Container Oral Q24H  . feeding supplement (PRO-STAT SUGAR FREE 64)  30 mL Oral BID  . fentaNYL  100 mcg Transdermal Q72H  . folic acid  1 mg Oral Daily  . furosemide  40 mg Intravenous BID  . ketorolac  30 mg Intravenous Q6H  . magnesium oxide  400 mg Oral BID  . mupirocin ointment  1 application Nasal BID  . naloxegol oxalate  25 mg Oral Daily  . nystatin  5 mL Oral QID  . potassium chloride  20 mEq Oral TID  . sertraline  100 mg Oral QHS  . sodium chloride flush  10 mL Intravenous Q8H  . [START ON 09/27/2015] Vitamin D (Ergocalciferol)  50,000 Units Oral Q30 days   Continuous Infusions:    LOS: 6 days    Time spent: 25 minutes     Erick BlinksJehanzeb Gareth Fitzner, MD [facility] If 7PM-7AM, please contact night-coverage [email or web address] Password [email or web address] 09/22/2015, 7:02 AM

## 2015-09-22 NOTE — Progress Notes (Signed)
Daily Progress Note   Patient Name: [name]       Date: 09/22/2015 DOB: 1955-11-01  Age: 60 y.o. MRN#: [id number] Attending Physician: Erick Blinks, MD Primary Care Physician: Josue Hector, MD Admit Date: 09/16/2015  Reason for Consultation/Follow-up: Disposition, Establishing goals of care, Hospice Evaluation and Psychosocial/spiritual support  Subjective: [name] is resting quietly in bed. She will open her eyes when asked, but only briefly maintains eye contact. She will moan, grimace, and moved her head and uncomfortable fashion. Family is at bedside including, daughter [name], sisters [name] and [name]. They share that she had pain medication 1 hour ago.  We talk about [name]'s diuresis yesterday, almost 2 L. We talk about her pain management also. I share are concerned that if her pain is controlled, her blood pressure is low, and therefore she does not have blood flow to the kidney. We talk about her other health illnesses including low albumin, her functional decline with functional quadriplegia. Patient and Chrissy state that pain management is more important to them at this time even if she does not have perfusion to her kidneys and therefore experiences decline leading to death. Their desire is for [name] to continue with full pain management today, also continue with Lasix, diuretic treatments today, check labs in the morning and make a decision on focus of care tomorrow. I will have a family meeting again tomorrow 8/16 at 0815.  I returned later in the morning to talk with sisters [name] and [name], daughter [name] has gone for the day. We discuss prognosis, and I share that is likely [name] will have only a few months (2 to 3) with full treatment. I share that  if we focus instead on pain management, she is likely to sleep more, eat less, and therefore have a faster decline. Family shares that their goal is for her to return to [facility].   Length of Stay: 6  Current Medications: Scheduled Meds:  . sodium chloride   Intravenous Once  . sodium chloride   Intravenous Once  . aspirin EC  81 mg Oral Daily  . bisacodyl  10 mg Oral q morning - 10a  . calcium carbonate  1 tablet Oral TID WC  . diclofenac sodium  4 g Topical QID  . feeding supplement  1 Container Oral Q24H  . feeding supplement (PRO-STAT SUGAR FREE 64)  30 mL Oral BID  . fentaNYL  100 mcg Transdermal Q72H  . folic acid  1 mg Oral Daily  . furosemide  40 mg Intravenous BID  . magnesium oxide  400 mg Oral BID  . mupirocin ointment  1 application Nasal BID  . naloxegol oxalate  25 mg Oral Daily  . nystatin  5 mL Oral QID  . potassium chloride  20 mEq Oral TID  . sertraline  100 mg Oral QHS  . sodium chloride flush  10 mL Intravenous Q8H  . [START ON 09/27/2015] Vitamin D (Ergocalciferol)  50,000 Units Oral Q30 days    Continuous Infusions:    PRN Meds: ALPRAZolam, alum & mag hydroxide-simeth, HYDROmorphone (DILAUDID) injection, nitroGLYCERIN, oxyCODONE, promethazine, promethazine, sodium chloride  Physical Exam  Constitutional:  Morbidly obese, chronically ill appearing, episodes of grimacing and crying out.  HENT:  Head: Normocephalic and atraumatic.  Cardiovascular: Normal rate and regular rhythm.   3+ pitting edema bilateral lower extremities, anasarca noted  Pulmonary/Chest: Effort normal. No respiratory distress.  Abdominal: Soft.  Morbidly obese  Musculoskeletal:  Functional quad  Neurological:  Will open eyes briefly, minimal eye contact  Skin: Skin is warm and dry.  Nursing note and vitals reviewed.           Vital Signs: BP (!) 96/56 (BP Location: Left Wrist)   Pulse (!) 106   Temp 98.1 F (36.7 C) (Oral)   Resp 20   Ht 5\' 7"  (1.702 m)   Wt (!)  162.8 kg (359 lb)   SpO2 100%   BMI 56.23 kg/m  SpO2: SpO2: 100 % O2 Device: O2 Device: Nasal Cannula O2 Flow Rate: O2 Flow Rate (L/min): 2 L/min  Intake/output summary:  Intake/Output Summary (Last 24 hours) at 09/22/15 1254 Last data filed at 09/22/15 [id number]  Gross per 24 hour  Intake              360 ml  Output             3450 ml  Net            -3090 ml   LBM: Last BM Date: 09/17/15 Baseline Weight: Weight: (!) 158.8 kg (350 lb) Most recent weight: Weight: (!) 162.8 kg (359 lb)       Palliative Assessment/Data:    Flowsheet Rows   Flowsheet Row Most Recent Value  Intake Tab  Referral Department  Hospitalist  Unit at Time of Referral  Med/Surg Unit  Palliative Care Primary Diagnosis  Pulmonary  Date Notified  09/20/15  Palliative Care Type  New Palliative care  Reason for referral  Clarify Goals of Care  Date of Admission  09/16/15  Date first seen by Palliative Care  09/21/15  # of days Palliative referral response time  1 Day(s)  # of days IP prior to Palliative referral  4  Clinical Assessment  Palliative Performance Scale Score  30%  Pain Max last 24 hours  Other (Comment) [12/10]  Pain Min Last 24 hours  10  Dyspnea Max Last 24 Hours  Not able to report  Dyspnea Min Last 24 hours  Not able to report  Psychosocial & Spiritual Assessment  Palliative Care Outcomes  Patient/Family meeting held?  Yes  Who was at the meeting?  Patient, daughter Donna CosierCrystal "Chrissy", sister Donna StanleyLisa.  Palliative Care Outcomes  Clarified goals of care, Changed CPR status, Completed durable DNR, Provided advance care planning,  Counseled regarding hospice, Transitioned to hospice  Patient/Family wishes: Interventions discontinued/not started   Mechanical Ventilation, Tube feedings/TPN, PEG  Palliative Care follow-up planned  -- [Follow-up while at [facility]]      Patient Active Problem List   Diagnosis Date Noted  . Palliative care encounter   . Goals of care, counseling/discussion   . DNR  (do not resuscitate) discussion   . Hypotension 09/19/2015  . Anasarca 09/19/2015  . Functional quadriplegia (HCC) 09/17/2015  . Sepsis (HCC) 09/16/2015  . Hypomagnesemia 09/16/2015  . Chronic pain disorder 09/16/2015  . UTI (lower urinary tract infection) 09/16/2015  . QT prolongation 09/16/2015  . Pressure ulcer 09/10/2015  . Obstructive sleep apnea 09/09/2015  . Hypokalemia 09/07/2015  . Anemia 05/28/2013  . GERD (gastroesophageal reflux disease) 05/27/2013  . Laryngopharyngeal reflux (LPR) 05/27/2013  . Constipation 02/19/2012  . Syncope and collapse 02/19/2012  . Chest pain 02/19/2012  . Knee osteoarthritis 05/03/2011  . Rotator cuff syndrome of right shoulder 05/03/2011  . Lumbar facet arthropathy 05/03/2011  . Morbid obesity (HCC) 05/03/2011    Palliative Care Assessment & Plan   Patient Profile: 60 y.o. female  with past medical history of Hypertension, obesity, osteoarthritis, thoracic or lumbosacral neuritis, lumbago, Gerd, anxiety and depression, SNF resident for 4 years admitted on 09/16/2015 with UTI with increased lactic acid level, low mag and potassium levels.   Assessment: As above.  Recommendations/Plan:  Discussion today regarding balance between managing pain and continuing treatments (kidney perfusion). Patient and daughter [name], both agree that the focus should be on managing pain. They state understanding that low blood pressures from pain medication will likely reduce kidney perfusion, and therefore the effectiveness of the treatment plan. Sister [name] agrees, asking that we manage Mrs. Vanvalkenburgh's pain today, but continue medication regimen, recheck labs in the a.m., and family will decide whether to focus on comfort or curative with tomorrow's information.  Goals of Care and Additional Recommendations:  Limitations on Scope of Treatment: As above, pain management is most important, but families desire is for a trial of diuretic in conjunction with pain  management and low blood pressure/low perfusion.  Code Status:    Code Status Orders        Start     Ordered   09/21/15 1234  Do not attempt resuscitation (DNR)  Continuous    Question Answer Comment  In the event of cardiac or respiratory ARREST Do not call a "code blue"   In the event of cardiac or respiratory ARREST Do not perform Intubation, CPR, defibrillation or ACLS   In the event of cardiac or respiratory ARREST Use medication by any route, position, wound care, and other measures to relive pain and suffering. May use oxygen, suction and manual treatment of airway obstruction as needed for comfort.      09/21/15 1234    Code Status History    Date Active Date Inactive Code Status Order ID Comments User Context   09/16/2015 11:53 PM 09/21/2015 12:34 PM Full Code [id number]  Haydee Monica, MD Inpatient   09/07/2015 10:02 PM 09/12/2015 10:45 PM Full Code [id number]  Meredeth Ide, MD Inpatient   02/19/2012  6:00 PM 02/22/2012  7:21 PM Full Code [id number]  Ned Clines, RN ED       Prognosis:   < 3 months, likely based on chronic disease burden, functional decline, functional quadriplegia, increased pain, families desire to focus on comfort.  Quite possibly only weeks if desire to focus on comfort alone.  Discharge Planning:  Goal [facility] [place].  Care plan was discussed with nursing staff, case manager, social worker, and Dr. Kerry HoughMemon.  Thank you for allowing the Palliative Medicine Team to assist in the care of this patient.   Time In: 0910 Time Out: 1000 Total Time 50 minutes Prolonged Time Billed  yes       Greater than 50%  of this time was spent counseling and coordinating care related to the above assessment and plan.  Aaleigha Bozza A, NP  Please contact Palliative Medicine Team phone at [phone] for questions and concerns.

## 2015-09-23 DIAGNOSIS — R532 Functional quadriplegia: Secondary | ICD-10-CM

## 2015-09-23 DIAGNOSIS — E876 Hypokalemia: Secondary | ICD-10-CM

## 2015-09-23 DIAGNOSIS — N390 Urinary tract infection, site not specified: Principal | ICD-10-CM

## 2015-09-23 DIAGNOSIS — G4733 Obstructive sleep apnea (adult) (pediatric): Secondary | ICD-10-CM

## 2015-09-23 DIAGNOSIS — G894 Chronic pain syndrome: Secondary | ICD-10-CM

## 2015-09-23 DIAGNOSIS — R601 Generalized edema: Secondary | ICD-10-CM

## 2015-09-23 LAB — BASIC METABOLIC PANEL
ANION GAP: 5 (ref 5–15)
BUN: 12 mg/dL (ref 6–20)
CHLORIDE: 102 mmol/L (ref 101–111)
CO2: 32 mmol/L (ref 22–32)
Calcium: 7.9 mg/dL — ABNORMAL LOW (ref 8.9–10.3)
Creatinine, Ser: <0.3 mg/dL — ABNORMAL LOW (ref 0.44–1.00)
Glucose, Bld: 99 mg/dL (ref 65–99)
Potassium: 3.8 mmol/L (ref 3.5–5.1)
Sodium: 139 mmol/L (ref 135–145)

## 2015-09-23 LAB — CBC
HCT: 23.1 % — ABNORMAL LOW (ref 36.0–46.0)
HEMOGLOBIN: 7.4 g/dL — AB (ref 12.0–15.0)
MCH: 35.4 pg — AB (ref 26.0–34.0)
MCHC: 32 g/dL (ref 30.0–36.0)
MCV: 110.5 fL — AB (ref 78.0–100.0)
Platelets: 203 10*3/uL (ref 150–400)
RBC: 2.09 MIL/uL — AB (ref 3.87–5.11)
RDW: 21.5 % — ABNORMAL HIGH (ref 11.5–15.5)
WBC: 5.6 10*3/uL (ref 4.0–10.5)

## 2015-09-23 MED ORDER — TORSEMIDE 20 MG PO TABS: 100.0000 mg | ORAL_TABLET | Freq: Every day | ORAL | Status: AC

## 2015-09-23 MED ORDER — MORPHINE SULFATE (CONCENTRATE) 10 MG/0.5ML PO SOLN: 10.0000 mg | ORAL | 0 refills | Status: AC | PRN

## 2015-09-23 MED ORDER — ALPRAZOLAM 1 MG PO TABS
1.0000 mg | ORAL_TABLET | Freq: Three times a day (TID) | ORAL | Status: DC
  Administered 2015-09-23: 1 mg via ORAL
  Filled 2015-09-23: qty 1

## 2015-09-23 MED ORDER — DICLOFENAC SODIUM 1 % TD GEL: 4.0000 g | Freq: Four times a day (QID) | TRANSDERMAL | Status: AC

## 2015-09-23 MED ORDER — FENTANYL 75 MCG/HR TD PT72: 150.0000 ug | MEDICATED_PATCH | TRANSDERMAL | 0 refills | Status: AC

## 2015-09-23 MED ORDER — POTASSIUM CHLORIDE 20 MEQ/15ML (10%) PO SOLN
20.0000 meq | Freq: Three times a day (TID) | ORAL | Status: DC
  Administered 2015-09-23: 20 meq via ORAL
  Filled 2015-09-23: qty 30

## 2015-09-23 MED ORDER — ALPRAZOLAM 1 MG PO TABS: 1.0000 mg | ORAL_TABLET | Freq: Three times a day (TID) | ORAL | 0 refills | Status: AC

## 2015-09-23 MED ORDER — BISACODYL 5 MG PO TBEC: 10.0000 mg | DELAYED_RELEASE_TABLET | Freq: Every morning | ORAL | 0 refills | Status: AC

## 2015-09-23 MED ORDER — HEPARIN SOD (PORK) LOCK FLUSH 100 UNIT/ML IV SOLN
250.0000 [IU] | INTRAVENOUS | Status: AC | PRN
  Administered 2015-09-23: 250 [IU]
  Filled 2015-09-23: qty 5

## 2015-09-23 NOTE — Clinical Social Work Note (Signed)
CSW updated facility on pt. Aware that pt will likely [facility] with [facility]. CSW to continue to follow.  Derenda FennelKara Damon Baisch, LCSW [phone]

## 2015-09-23 NOTE — Clinical Social Work Note (Signed)
Pt d/c today back [facility]. Pt and daughter, Crystal aware and agreeable. They are aware [facility]. [facility].  Derenda FennelKara Denell Cothern, LCSW [phone]

## 2015-09-23 NOTE — Care Management Note (Signed)
Case Management Note  Patient Details  Name: [name] MRN: [id number] Date of Birth: 06/28/1955  Expected Discharge Date:    09/23/2015              Expected Discharge Plan:  Skilled Nursing Facility  In-House Referral:  Clinical Social Work, NA  Discharge planning Services  CM Consult  Post Acute Care Choice:  NA Choice offered to:  NA  DME Arranged:    DME Agency:     HH Arranged:    HH Agency:     Status of Service:  Completed, signed off  If discussed at [employer] Meetings, dates discussed:    Additional Comments: Pt discharging back to SNF today with hosp[facility]. [name] and pt/family are in agreement. [facility] they are aware of DC today and will see pt tomorrow. CSW has made arrangement for return to facility. No further CM needs.  [name], [name], RN 09/23/2015, 3:14 PM

## 2015-09-23 NOTE — Progress Notes (Signed)
Daily Progress Note   Patient Name: Donna QuailDebbie J Townsend       Date: 09/23/2015 DOB: 05/07/1955  Age: 60 y.o. MRN#: [id number] Attending Physician: Elliot Cousinenise Fisher, MD Primary Care Physician: Josue HectorNYLAND,LEONARD ROBERT, MD Admit Date: 09/16/2015  Reason for Consultation/Follow-up: Disposition, Establishing goals of care, Hospice Evaluation, Pain control and Psychosocial/spiritual support  Subjective: [name]  is resting quietly in bed, her sisters Donna StanleyLisa and [name] at bedside. [name] only briefly opens her eyes when I ask her, and does not make eye contact. Family states that she just got her anxiety and pain medications in the last 30 minutes. Donna StanleyLisa calls daughter, [name] on the phone to participate in our conference. We talk about overnight diuresis of over 3 L.  I share my concern about blood pressure readings possibly being inaccurate due to [name]'s body habitus. We also talk about pain management and anxiety control. Donna StanleyLisa shares that she feels anti-anxiety medications have reduced [name]'s discomfort. We discuss and family agrees to increase and schedule anti-anxiety medications.  We talk about [name] either today or tomorrow. [facility] [place].  Family asks about my having medication at the facility, and I encourage them to call the director of nursing to discuss concerns related to medication administration at the facility. We again talk about prognosis, and I share that it could be as little as a few weeks, or as much as 3 months. We talk about [name] ability and desire to take medications unrelated to pain, her immobility, her suffering, her edema and low albumin at 1.5.  Family shares that [name]  told them her desire is to take her regular scheduled medications by mouth as long as she is able, but focus is primarily on pain management, regardless of outcomes. Palliative medicine team is in agreement.  Length of Stay: 7  Current Medications: Scheduled Meds:  . sodium chloride   Intravenous Once  . sodium chloride   Intravenous Once  . ALPRAZolam  1 mg Oral TID  . aspirin EC  81 mg Oral Daily  . bisacodyl  10 mg Oral q morning - 10a  . calcium carbonate  1 tablet Oral TID WC  . diclofenac sodium  4 g Topical QID  . feeding supplement  1 Container Oral Q24H  . feeding supplement (PRO-STAT SUGAR FREE 64)  30 mL Oral BID  . fentaNYL  150 mcg Transdermal Q72H  . folic acid  1 mg Oral Daily  . furosemide  40 mg Intravenous BID  . magnesium oxide  400 mg Oral BID  . mupirocin ointment  1 application Nasal BID  . naloxegol oxalate  25 mg Oral Daily  . nystatin  5 mL Oral QID  . potassium chloride  20 mEq Oral TID  . sertraline  100 mg Oral QHS  . sodium chloride flush  10 mL Intravenous Q8H  . [START ON 09/27/2015] Vitamin D (Ergocalciferol)  50,000 Units Oral Q30 days    Continuous Infusions:    PRN Meds: alum & mag hydroxide-simeth, morphine CONCENTRATE, nitroGLYCERIN, promethazine, promethazine, sodium chloride  Physical Exam  Constitutional: No distress.  Morbidly obese, chronically ill appearing, moaning at times.  HENT:  Head: Normocephalic and atraumatic.  Cardiovascular: Normal rate.   Bilateral lower extremity edema, improved  Pulmonary/Chest: Effort normal. No respiratory distress.  Abdominal: Soft.  Morbidly obese  Neurological:  Does not open eyes to command at this time  Skin: Skin is warm and dry.  Nursing note and vitals reviewed.           Vital Signs: BP (!) 82/42 (BP Location: Left Arm)   Pulse (!) 103   Temp 98.7 F (37.1 C) (Oral)   Resp 20   Ht 5\' 7"  (1.702 m)   Wt (!) 162.8 kg (359 lb)   SpO2 100%   BMI 56.23 kg/m  SpO2: SpO2: 100 % O2  Device: O2 Device: Nasal Cannula O2 Flow Rate: O2 Flow Rate (L/min): 2 L/min  Intake/output summary:  Intake/Output Summary (Last 24 hours) at 09/23/15 1254 Last data filed at 09/23/15 1100  Gross per 24 hour  Intake             1100 ml  Output             4850 ml  Net            -3750 ml   LBM: Last BM Date: 09/22/15 Baseline Weight: Weight: (!) 158.8 kg (350 lb) Most recent weight: Weight: (!) 162.8 kg (359 lb)       Palliative Assessment/Data:    Flowsheet Rows   Flowsheet Row Most Recent Value  Intake Tab  Referral Department  Hospitalist  Unit at Time of Referral  Med/Surg Unit  Palliative Care Primary Diagnosis  Pulmonary  Date Notified  09/20/15  Palliative Care Type  New Palliative care  Reason for referral  Clarify Goals of Care  Date of Admission  09/16/15  Date first seen by Palliative Care  09/21/15  # of days Palliative referral response time  1 Day(s)  # of days IP prior to Palliative referral  4  Clinical Assessment  Palliative Performance Scale Score  30%  Pain Max last 24 hours  Other (Comment) [12/10]  Pain Min Last 24 hours  10  Dyspnea Max Last 24 Hours  Not able to report  Dyspnea Min Last 24 hours  Not able to report  Psychosocial & Spiritual Assessment  Palliative Care Outcomes  Patient/Family meeting held?  Yes  Who was at the meeting?  Patient, daughter Donna Cosier "Chrissy", sister [name].  Palliative Care Outcomes  Clarified goals of care, Changed CPR status, Completed durable DNR, Provided advance care planning, Counseled regarding hospice, Transitioned to hospice  Patient/Family wishes: Interventions discontinued/not started  Mechanical Ventilation, Tube feedings/TPN, PEG  Palliative Care follow-up planned  -- [Follow-up while at [facility]]      Patient Active Problem List   Diagnosis Date Noted  . Palliative care encounter   . Goals of care, counseling/discussion   . DNR (do not resuscitate) discussion   . Hypotension 09/19/2015  . Anasarca  09/19/2015  . Functional quadriplegia (HCC) 09/17/2015  . Sepsis (HCC) 09/16/2015  . Hypomagnesemia 09/16/2015  . Chronic pain disorder 09/16/2015  . Urinary tract infectious disease 09/16/2015  . QT prolongation 09/16/2015  . Pressure ulcer 09/10/2015  . Obstructive sleep apnea 09/09/2015  . Hypokalemia 09/07/2015  . Anemia 05/28/2013  . GERD (gastroesophageal reflux disease) 05/27/2013  . Laryngopharyngeal reflux (LPR) 05/27/2013  . Constipation 02/19/2012  . Syncope and collapse 02/19/2012  . Chest pain 02/19/2012  . Knee osteoarthritis 05/03/2011  . Rotator cuff syndrome of right shoulder 05/03/2011  . Lumbar facet arthropathy 05/03/2011  . Morbid obesity (HCC) 05/03/2011    Palliative Care Assessment & Plan   Patient Profile: 60 y.o.femalewith past medical history of Hypertension, obesity, osteoarthritis, thoracic or lumbosacral neuritis, lumbago, Gerd, anxiety and depression, SNF resident for 4 yearsadmitted on 8/9/2017with UTI with increased lactic acid level, low mag and potassium levels.  Assessment: As above  Recommendations/Plan:  [facility]. Patient and family desire is to continue with current medication regimen as long as feasible, as long as [name] is able/wants to take medications, although her main focus continues to be pain management.  Goals of Care and Additional Recommendations:  Limitations on Scope of Treatment: [facility] with the benefits of hospice, no sure Vidal Schwalbeeri measures, no peg tube, focuses on pain management  Code Status:    Code Status Orders        Start     Ordered   09/21/15 1234  Do not attempt resuscitation (DNR)  Continuous    Question Answer Comment  In the event of cardiac or respiratory ARREST Do not call a "code blue"   In the event of cardiac or respiratory ARREST Do not perform Intubation, CPR, defibrillation or ACLS   In the event of cardiac or  respiratory ARREST Use medication by any route, position, wound care, and other measures to relive pain and suffering. May use oxygen, suction and manual treatment of airway obstruction as needed for comfort.      09/21/15 1234    Code Status History    Date Active Date Inactive Code Status Order ID Comments User Context   09/16/2015 11:53 PM 09/21/2015 12:34 PM Full Code [id number]  Haydee Monicaachal A David, MD Inpatient   09/07/2015 10:02 PM 09/12/2015 10:45 PM Full Code [id number]  Meredeth IdeGagan S Lama, MD Inpatient   02/19/2012  6:00 PM 02/22/2012  7:21 PM Full Code [id number]  Ned ClinesFaith-Marie Hasz, RN ED       Prognosis:   < 3 months or less, likely based on chronic disease burden, functional decline, low albumin at 1.5.  Discharge Planning:  Skilled Nursing Facility with Hospice  Care plan was discussed with Nursing staff, social worker, case manager, and Dr. Sherrie MustacheFisher.  Thank you for allowing the Palliative Medicine Team to assist in the care of this patient.   Time In: 0815 Time Out: 0900 Total Time 45 minutes Prolonged Time Billed  yes       Greater than 50%  of this time was spent counseling and coordinating care related to the above assessment and  plan.  Katheran Awe, NP  Please contact Palliative Medicine Team phone at [phone] for questions and concerns.

## 2015-09-23 NOTE — [place]2 (Signed)
[place] MEDICAID FL2 LEVEL OF CARE SCREENING TOOL     IDENTIFICATION  Patient Name: [name] Birthdate: 11/25/1955 Sex: female Admission Date (Current Location): 09/16/2015  [place]  [place] [id number] R Facility and Address:  [facility],  [address], [place] [id number]      Provider Number: [id number]  Attending Physician Name and Address:  Elliot Cousinenise Fisher, MD  Relative Name and Phone Number:       Current Level of Care: Hospital Recommended Level of Care: Skilled Nursing Facility Prior Approval Number:    Date Approved/Denied:   PASRR Number: [id number]  Discharge Plan: SNF    Current Diagnoses: Patient Active Problem List   Diagnosis Date Noted  . Palliative care encounter   . Goals of care, counseling/discussion   . DNR (do not resuscitate) discussion   . Hypotension 09/19/2015  . Anasarca 09/19/2015  . Functional quadriplegia (HCC) 09/17/2015  . Sepsis (HCC) 09/16/2015  . Hypomagnesemia 09/16/2015  . Chronic pain disorder 09/16/2015  . Urinary tract infectious disease 09/16/2015  . QT prolongation 09/16/2015  . Pressure ulcer 09/10/2015  . Obstructive sleep apnea 09/09/2015  . Hypokalemia 09/07/2015  . Anemia 05/28/2013  . GERD (gastroesophageal reflux disease) 05/27/2013  . Laryngopharyngeal reflux (LPR) 05/27/2013  . Constipation 02/19/2012  . Syncope and collapse 02/19/2012  . Chest pain 02/19/2012  . Knee osteoarthritis 05/03/2011  . Rotator cuff syndrome of right shoulder 05/03/2011  . Lumbar facet arthropathy 05/03/2011  . Morbid obesity (HCC) 05/03/2011    Orientation RESPIRATION BLADDER Height & Weight     Self, Time, Situation, Place  O2 (2 L) Indwelling catheter Weight: (!) 359 lb (162.8 kg) Height:  5\' 7"  (170.2 cm)  BEHAVIORAL SYMPTOMS/MOOD NEUROLOGICAL BOWEL NUTRITION STATUS  Other (Comment) (N/A)  (N/A) Incontinent Diet (Heart healthy)  AMBULATORY STATUS COMMUNICATION OF NEEDS Skin    Total Care Verbally Bruising, Other (Comment) (Moisture associated skin damage. Stage II to left and right buttocks. )                       Personal Care Assistance Level of Assistance  Bathing, Feeding, Dressing Bathing Assistance: Maximum assistance Feeding assistance: Limited assistance Dressing Assistance: Maximum assistance     Functional Limitations Info  Sight, Hearing, Speech Sight Info: Adequate Hearing Info: Adequate      SPECIAL CARE FACTORS FREQUENCY                       Contractures      Additional Factors Info  Psychotropic Code Status Info: DNR. 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 Contact precautions.     Current Medications (09/23/2015):  This is the current hospital active medication list Current Facility-Administered Medications  Medication Dose Route Frequency Provider Last Rate Last Dose  . 0.9 %  sodium chloride infusion   Intravenous Once Erick BlinksJehanzeb Memon, MD      . 0.9 %  sodium chloride infusion   Intravenous Once Erick BlinksJehanzeb Memon, MD      . ALPRAZolam Prudy Feeler(XANAX) tablet 1 mg  1 mg Oral TID Katheran Aweasha A Dove, NP      . alum & mag hydroxide-simeth (MAALOX/MYLANTA) 200-200-20 MG/5ML suspension 15 mL  15 mL Oral Q4H PRN Erick BlinksJehanzeb Memon, MD      . aspirin EC tablet 81 mg  81 mg Oral Daily Haydee Monicaachal A David, MD   81 mg at 09/23/15 1054  .  bisacodyl (DULCOLAX) EC tablet 10 mg  10 mg Oral q morning - 10a Haydee Monicaachal A David, MD   10 mg at 09/23/15 1055  . calcium carbonate (TUMS - dosed in mg elemental calcium) chewable tablet 200 mg of elemental calcium  1 tablet Oral TID WC Haydee Monicaachal A David, MD   200 mg of elemental calcium at 09/23/15 1133  . diclofenac sodium (VOLTAREN) 1 % transdermal gel 4 g  4 g Topical QID Erick BlinksJehanzeb Memon, MD   4 g at 09/23/15 1101  . feeding supplement (BOOST / RESOURCE BREEZE) liquid 1 Container  1 Container Oral Q24H Haydee Monicaachal A David, MD   1 Container at 09/23/15 1051   . feeding supplement (PRO-STAT SUGAR FREE 64) liquid 30 mL  30 mL Oral BID Haydee Monicaachal A David, MD   30 mL at 09/23/15 1050  . fentaNYL (DURAGESIC - dosed mcg/hr) 150 mcg  150 mcg Transdermal Q72H Erick BlinksJehanzeb Memon, MD   150 mcg at 09/22/15 1358  . folic acid (FOLVITE) tablet 1 mg  1 mg Oral Daily Erick BlinksJehanzeb Memon, MD   1 mg at 09/23/15 1055  . furosemide (LASIX) injection 40 mg  40 mg Intravenous BID Erick BlinksJehanzeb Memon, MD   40 mg at 09/23/15 0821  . magnesium oxide (MAG-OX) tablet 400 mg  400 mg Oral BID Haydee Monicaachal A David, MD   400 mg at 09/23/15 1055  . morphine CONCENTRATE 10 MG/0.5ML oral solution 10 mg  10 mg Sublingual Q1H PRN Erick BlinksJehanzeb Memon, MD   10 mg at 09/23/15 1431  . mupirocin ointment (BACTROBAN) 2 % 1 application  1 application Nasal BID Haydee Monicaachal A David, MD   1 application at 09/23/15 1056  . naloxegol oxalate (MOVANTIK) tablet 25 mg  25 mg Oral Daily Erick BlinksJehanzeb Memon, MD   25 mg at 09/23/15 1054  . nitroGLYCERIN (NITROSTAT) SL tablet 0.4 mg  0.4 mg Sublingual Q5 min PRN Haydee Monicaachal A David, MD      . nystatin (MYCOSTATIN) 100000 UNIT/ML suspension 500,000 Units  5 mL Oral QID Haydee Monicaachal A David, MD   500,000 Units at 09/23/15 1056  . potassium chloride 20 MEQ/15ML (10%) solution 20 mEq  20 mEq Oral TID Elliot Cousinenise Fisher, MD   20 mEq at 09/23/15 1052  . promethazine (PHENERGAN) suppository 25 mg  25 mg Rectal Q4H PRN Haydee Monicaachal A David, MD      . promethazine (PHENERGAN) tablet 25 mg  25 mg Oral Q4H PRN Haydee Monicaachal A David, MD   25 mg at 09/20/15 0141  . sertraline (ZOLOFT) tablet 100 mg  100 mg Oral QHS Haydee Monicaachal A David, MD   100 mg at 09/22/15 2309  . sodium chloride (OCEAN) 0.65 % nasal spray 1 spray  1 spray Each Nare PRN Erick BlinksJehanzeb Memon, MD   1 spray at 09/22/15 1054  . sodium chloride flush (NS) 0.9 % injection 10 mL  10 mL Intravenous Q8H Haydee Monicaachal A David, MD   10 mL at 09/23/15 [id number]  . [START ON 09/27/2015] Vitamin D (Ergocalciferol) (DRISDOL) capsule 50,000 Units  50,000 Units Oral Q30 days Haydee Monicaachal A David, MD          Discharge Medications: Please see discharge summary for a list of discharge medications.  Relevant Imaging Results:  Relevant Lab Results:   Additional Information Pt has PICC line. [facility]   Derenda FennelStultz, [place] Bogle [place], [place] [phone]

## 2015-09-23 NOTE — Discharge Summary (Addendum)
Physician Discharge Summary  [name] ZOX:[id number] DOB: May 30, 1955 DOA: 09/16/2015  PCP: Josue Hector, MD  Admit date: 09/16/2015 Discharge date: 09/23/2015  Time spent:greater than 30  minutes  Recommendations for Outpatient Follow-up:  1. Patient is being discharged back to skilled nursing facility, [facility] with hospice services. 2. Pain medication and anti-anxiety medications have been titrated up for better pain control. Further     Discharge Diagnoses:  1. Urinary tract infection; clinically undetermined if it was associated with indwelling Foley catheter. 2. 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 Status post 2 units of packed red blood cell transfusions. 3. Hyponatremia. 4. Hypokalemia. 5. Hypomagnesemia. 6. End-stage chronic pain disorder. 7. Obstructive sleep apnea, on CPAP. 8. Morbid obesity. 9. Functional quadriplegia. 10. Hypertension, with intermittent hypotension. 12. Hypoalbuminemia causing anasarca. 13. Severe protein calorie malnutrition. 14. Prolonged QT. 15. DO NOT RESUSCITATE status with hospice to follow at the SNF.  Discharge Condition: Improved but overall prognosis poor.  Diet recommendation: Regular or heart healthy as tolerated.  Filed Weights   09/16/15 1853 09/17/15 0030  Weight: (!) 158.8 kg (350 lb) (!) 162.8 kg (359 lb)    History of present illness:  Patient is a 60 -year-old woman with a hx of obesity, chronic indwelling foley, bed bound, chronic pain medication, DVT, anxiety, depression, GERD, and anemia, who presented with complaints of generalized pain. During the evaluation in the emergency department, patient was noted to be hypokalemic and hypomagnesemic with an increased lactic acid levels. UA was indicative of infection. She was treated with rocephin, but urine cultures did not show any specific growth. Patient was also noted to be massively volume overloaded with anasarca, related to hypoalbuminemia.  She was started on IV Lasix with albumin infusions. Diuresing her was challenging due to hypotension. Her low blood pressures were likely related to her pain medications. Patient has chronic pain and is on large doses of narcotics. Overall prognosis is poor. Palliative care was consulted and followed the patient. Following palliative care conversation with the patient and family, she was made a DO NOT RESUSCITATE status. Full comfort care was considered, but the family wanted to continue treatment of her chronic medical conditions in addition to her chronic pain. Hospice has been arranged to follow the patient at the SNF.  Hospital Course:  UTI with hx of indwelling foley catheter. Patient was started on Rocephin. Blood cultures and urine culture were ordered. Blood cultures and the urine culture were negative to date. Rocephin was discontinued after 5 days.  Anemia, possibly due to hemodilution. Patient was hydrated at the time of admission. No evidence of overt bleeding. Anemia panel revealed a normal iron and ferritin. Folate was mildly low and she was started on a folic acid supplement during the hospitalization. She was eventually transfused 2 units of packed red blood cells. Her hemoglobin improved, but drifted back down to 7.4 at the time of discharge. There was no bruising noted in flanks. CT abd/pelvis revealed no evidence of internal bleed. There were no bowel movements, making GI bleed unlikely. Given her morbid obesity and DO NOT RESUSCITATE status, a more invasive diagnostic study was not indicated. Would recommend transfusing her only if she becomes symptomatic.  Hyponatremia. Resolved, following gentle IV fluids and then Lasix.  Hypokalemia. Resolved, following potassium chloride supplementation. Her home dose of potassium was increased slightly to 30 mEq daily.  Hypomagnesemia. Magnesium was supplemented orally. She continues on magnesium oxide.  Chronic pain disorder. Pain medications  were adjusted and titrated  up per palliative care nurse practitioner, Ms. Marice Potter. The medical team was in agreement.  GERD. She was continued on her PPI.   Obstructive sleep apnea. She was continued on CPAP.   Morbid obesity. Noted.   Functional quadriplegia. Pt has been bed bound for almost a year now. She reported chronic issues with her neck and back. She also has severe neuropathy. She would benefit from some physical therapy. She certainly is not a candidate for any invasive treatments. Her weakness//numbness/pain has been long standing and did not appear to be acutely changed. TSH and B12 results were unremarkable  History of hypertension. Losartan was held due to low-normal blood pressures. It was discontinued at the time of discharge.  Hypoalbuminemia/Anasarca. Albumin was 1.5 on admission. She recieved albumin infusions to help with blood pressures, since she was likely third spacing IV fluids. She was subsequently started on IV lasix for diuresis and has had fairly good urine output. There was some decrease in her peripheral edema. Her home dose of Demadex was increased from 80 mg to 100 mg at the time of discharge.  Hypotension. Patient had blood pressures in the 80s systolically at times. She did not appear to be septic. ECHO  Revealed an EF normal of 60-65%. Cortisol was within normal limits. normal. Etiology was likely related to high dose of pain medications. Her systolic blood pressure was in the 100s at the time of discharge.  Discussion. Patient has had poor functional capacity for quite some time. She is bed bound, has poor nutritional status and is in overall poor health. Palliative care was consulted and followed the patient. After discussions with the patient and family, it was decided that patient would be DNR. Family wishes to continue high doses of pain medications, even if that means that patient may become hypotensive. She will be discharged back to the SNF with hospice  services.   Procedures:  Transfusion one unit of packed red blood cells on 09/20/15 and 09/21/15  ECHO Study Conclusions - Left ventricle: The cavity size was normal. Wall thickness was normal. Systolic function was normal. The estimated ejection fraction was in the range of 60% to 65%. Wall motion was normal; there were no regional wall motion abnormalities. Left ventricular diastolic function parameters were normal. - Aortic valve: Valve area (VTI): 2.98 cm^2. Valve area (Vmax): 2.67 cm^2. - Atrial septum: No defect or patent foramen ovale was identified. - Pulmonary arteries: Systolic pressure was moderately increased. PA peak pressure: 45 mm Hg (S). - Technically adequate study.  Consultations:  Palliative care  Discharge Exam: Vitals:   09/22/15 2102 09/23/15 1322  BP: (!) 82/42 (!) 112/51  Pulse: (!) 103 99  Resp: 20   Temp: 98.7 F (37.1 C) 97.2 F (36.2 C)  Respiratory rate 20 oxygen saturation 100%  General: Morbidly obese 60 year old Caucasian woman in no acute distress. Cardiovascular: S1, S2, no murmurs rubs or gallops. Respiratory: Clear anteriorly with decreased breath sounds in the bases.  Discharge Instructions   Discharge Instructions    Diet - low sodium heart healthy    Complete by:  As directed   Increase activity slowly    Complete by:  As directed     Current Discharge Medication List    START taking these medications   Details  diclofenac sodium (VOLTAREN) 1 % GEL Apply 4 g topically 4 (four) times daily.    Morphine Sulfate (MORPHINE CONCENTRATE) 10 MG/0.5ML SOLN concentrated solution Place 0.5 mLs (10 mg total) under the tongue every  hour as needed for severe pain. Qty: 120 mL, Refills: 0      CONTINUE these medications which have CHANGED   Details  ALPRAZolam (XANAX) 1 MG tablet Take 1 tablet (1 mg total) by mouth 3 (three) times daily. Qty: 30 tablet, Refills: 0    bisacodyl (DULCOLAX) 5 MG EC tablet Take 2 tablets (10  mg total) by mouth every morning. Qty: 30 tablet, Refills: 0    fentaNYL (DURAGESIC - DOSED MCG/HR) 75 MCG/HR Place 2 patches (150 mcg total) onto the skin every 3 (three) days. Qty: 5 patch, Refills: 0    torsemide (DEMADEX) 20 MG tablet Take 5 tablets (100 mg total) by mouth daily.      CONTINUE these medications which have NOT CHANGED   Details  acetaminophen (TYLENOL) 500 MG tablet Take 1,000 mg by mouth every 8 (eight) hours as needed.    Alum Hydroxide-Mag Carbonate (GAVISCON EXTRA STRENGTH) 160-105 MG CHEW Chew 2 tablets by mouth 4 (four) times daily as needed (acid reflux).    Amino Acids-Protein Hydrolys (FEEDING SUPPLEMENT, PRO-STAT SUGAR FREE 64,) LIQD Take 30 mLs by mouth 2 (two) times daily.    aspirin EC 81 MG tablet Take 81 mg by mouth daily.    calcium carbonate (TUMS - DOSED IN MG ELEMENTAL CALCIUM) 500 MG chewable tablet Chew 1 tablet by mouth 3 (three) times daily with meals.     esomeprazole (NEXIUM) 40 MG capsule Take 40 mg by mouth 2 (two) times daily before a meal.    feeding supplement (BOOST / RESOURCE BREEZE) LIQD Take 1 Container by mouth daily. Qty: 30 Container, Refills: 0    heparin flush 10 UNIT/ML SOLN injection Inject 10 Units into the vein 3 (three) times daily.    magnesium oxide (MAG-OX) 400 MG tablet Take 400 mg by mouth 2 (two) times daily.     mupirocin ointment (BACTROBAN) 2 % Place 1 application into the nose 2 (two) times daily. Qty: 22 g, Refills: 0    naloxegol oxalate (MOVANTIK) 25 MG TABS tablet Take 25 mg by mouth daily.    nitroGLYCERIN (NITROSTAT) 0.4 MG SL tablet Place 0.4 mg under the tongue every 5 (five) minutes as needed for chest pain.    nystatin (MYCOSTATIN) 100000 UNIT/ML suspension Take 5 mLs (500,000 Units total) by mouth 4 (four) times daily. Qty: 60 mL, Refills: 0    ondansetron (ZOFRAN-ODT) 8 MG disintegrating tablet Take 8 mg by mouth every 8 (eight) hours as needed for nausea or vomiting.    potassium chloride  (K-DUR,KLOR-CON) 10 MEQ tablet Take 20 mEq by mouth 3 (three) times daily.    promethazine (PHENERGAN) 25 MG suppository Place 1 suppository rectally every 4 (four) hours as needed for nausea or vomiting.     promethazine (PHENERGAN) 25 MG tablet Take 1 tablet by mouth every 4 (four) hours as needed for nausea or vomiting.     ranitidine (ZANTAC) 75 MG tablet Take 75 mg by mouth 2 (two) times daily.    sertraline (ZOLOFT) 100 MG tablet Take 100 mg by mouth at bedtime.    Sodium Chloride Flush (NORMAL SALINE FLUSH) 0.9 % SOLN Inject 10 mLs into the vein 3 (three) times daily.    Vitamin D, Ergocalciferol, (DRISDOL) 50000 units CAPS capsule Take 50,000 Units by mouth every 30 (thirty) days. Takes on the 20th of every month.      STOP taking these medications     fentaNYL (DURAGESIC - DOSED MCG/HR) 100 MCG/HR  fentaNYL (DURAGESIC - DOSED MCG/HR) 50 MCG/HR      losartan (COZAAR) 25 MG tablet      Oxycodone HCl 10 MG TABS      saccharomyces boulardii (FLORASTOR) 250 MG capsule        Allergies  Allergen Reactions  . Sulfa Antibiotics Itching and Swelling      The results of significant diagnostics from this hospitalization (including imaging, microbiology, ancillary and laboratory) are listed below for reference.    Significant Diagnostic Studies: Ct Abdomen Pelvis Wo Contrast  Result Date: 09/19/2015 CLINICAL DATA:  Urinary tract infection. Quadriplegia. Hypotension and anemia. Clinical concern for internal bleeding. Previous cholecystectomy and hysterectomy. EXAM: CT ABDOMEN AND PELVIS WITHOUT CONTRAST TECHNIQUE: Multidetector CT imaging of the abdomen and pelvis was performed following the standard protocol without IV contrast. COMPARISON:  Previous examinations, the most recent dated 09/04/2015. Chest CT dated 01/21/2015. FINDINGS: Lower chest: Multiple sub cm nodules are again demonstrated at the lung bases, without significant change since 09/09/2005. These are smaller  and fewer in number compared to the CT 01/21/2015. The largest is in the left lower lobe, measuring 7 mm in diameter on image number 1. Hepatobiliary: Limited by streak artifacts produced by the patient's arms. Diffuse low density of the liver relative to the spleen. Cholecystectomy clips. Pancreas: No mass or inflammatory process identified on this un-enhanced exam. Spleen: Within normal limits in size and appearance. Adrenals/Urinary Tract: Foley catheter in the urinary bladder with associated air in the bladder. No significant urine is seen in the bladder. Unremarkable non contrasted appearance of the kidneys. No urinary tract calculi or hydronephrosis seen. Stomach/Bowel: Gas distended sigmoid colon without significant change since 09/04/2015. No gastric or small bowel abnormalities are seen. No evidence of appendicitis. Vascular/Lymphatic: Minimal aortic atherosclerotic calcification. No aneurysm or enlarged lymph nodes. Reproductive: Surgically absent uterus.  No adnexal masses. Other: None. Musculoskeletal: Limited evaluation of the bony structures due to artifacts associated with the patient's body habitus and arms. Minimal bilateral hip degenerative changes. Lumbar and lower thoracic spine degenerative changes. IMPRESSION: 1. No acute abnormality. 2. Stable sigmoid ileus. 3. Decreased size and number of sub cm nodules at the lung bases. 4. Diffuse hepatic steatosis. 5. Minimal aortic atherosclerosis. 6. No retroperitoneal hemorrhage seen. Electronically Signed   By: Beckie SaltsSteven  Reid M.D.   On: 09/19/2015 19:36   Ct Abdomen Pelvis Wo Contrast  Result Date: 09/04/2015 CLINICAL DATA:  Nausea and vomiting for several months with abdominal pain, initial encounter EXAM: CT ABDOMEN AND PELVIS WITHOUT CONTRAST TECHNIQUE: Multidetector CT imaging of the abdomen and pelvis was performed following the standard protocol without IV contrast. COMPARISON:  01/05/2015 FINDINGS: Lower chest:  Stable sub cm nodules are again  identified Hepatobiliary: Diffuse fatty infiltration of the liver is noted. The gallbladder has been surgically removed. No hepatic mass is seen. Pancreas: No mass or inflammatory process identified on this un-enhanced exam. Spleen: Within normal limits in size. Adrenals/Urinary Tract: The adrenal glands are within normal limits. Kidney show no obstructive changes. No calculi are seen. No focal renal masses are noted. The bladder is decompressed by Foley catheter. Stomach/Bowel: Fecal material is noted throughout the colon without obstructive change or significant constipation. The appendix is within normal limits. No significant diverticular change is identified. Vascular/Lymphatic: No pathologically enlarged lymph nodes. No evidence of abdominal aortic aneurysm. Reproductive: No mass or other significant abnormality. Other: None. Musculoskeletal:  No suspicious bone lesions identified. IMPRESSION: Stable sub cm lung nodules when compared with the prior examination. Non-contrast  chest CT at 12-18 months (from today's scan) is considered optional for low-risk patients, but is recommended for high-risk patients. This recommendation follows the consensus statement: Guidelines for Management of Incidental Pulmonary Nodules Detected on CT Images:From the Fleischner Society 2017; published online before print (10.1148/radiol.1610960454). Chronic changes without acute abnormality. Electronically Signed   By: Alcide Clever M.D.   On: 09/04/2015 14:17  Dg Chest Portable 1 View  Result Date: 09/16/2015 CLINICAL DATA:  Chest pain.  Pain over entire body. EXAM: PORTABLE CHEST 1 VIEW COMPARISON:  One-view chest x-ray 02/19/2012. CT of the chest 01/21/2015. FINDINGS: Heart size is normal. Chronic elevation of the right hemidiaphragm is again noted. Chronic interstitial coarsening is again noted. No focal airspace consolidation is present. A right-sided PICC line terminates at the cavoatrial junction. IMPRESSION: 1. No acute  cardiopulmonary disease or significant interval change. 2. Chronic elevation of right hemidiaphragm. 3. Right-sided PICC line. Electronically Signed   By: Marin Roberts M.D.   On: 09/16/2015 19:41    Microbiology: Recent Results (from the past 240 hour(s))  Urine culture     Status: Abnormal   Collection Time: 09/16/15  7:00 PM  Result Value Ref Range Status   Specimen Description URINE, CLEAN CATCH  Final   Special Requests NONE  Final   Culture MULTIPLE SPECIES PRESENT, SUGGEST RECOLLECTION (A)  Final   Report Status 09/18/2015 FINAL  Final  Blood culture (routine x 2)     Status: None   Collection Time: 09/16/15  8:25 PM  Result Value Ref Range Status   Specimen Description BLOOD PICC LINE DRAWN BY RN  Final   Special Requests BOTTLES DRAWN AEROBIC AND ANAEROBIC 6CC  Final   Culture NO GROWTH 5 DAYS  Final   Report Status 09/21/2015 FINAL  Final  Blood culture (routine x 2)     Status: None   Collection Time: 09/16/15  8:36 PM  Result Value Ref Range Status   Specimen Description BLOOD LEFT HAND  Final   Special Requests BOTTLES DRAWN AEROBIC AND ANAEROBIC [facility]  Final   Culture NO GROWTH 5 DAYS  Final   Report Status 09/21/2015 FINAL  Final     Labs: Basic Metabolic Panel:  Recent Labs Lab 09/16/15 1914 09/17/15 0008  09/19/15 0640 09/20/15 0808 09/21/15 0531 09/22/15 0654 09/23/15 0735  NA 126*  --   < > 129* 136 139 140 139  K 3.1*  --   < > 3.6 4.0 4.3 4.4 3.8  CL 93*  --   < > 98* 104 105 105 102  CO2 24  --   < > 26 27 29 30  32  GLUCOSE 96  --   < > 103* 84 86 83 99  BUN 14  --   < > 20 15 13 13 12   CREATININE 0.53  --   < > 0.41* 0.30* <0.30* 0.33* <0.30*  CALCIUM 6.9*  --   < > 7.7* 7.8* 8.1* 8.1* 7.9*  MG 1.1* 1.5*  --   --  1.7  --   --   --   < > = values in this interval not displayed. Liver Function Tests:  Recent Labs Lab 09/16/15 1914 09/21/15 0531  AST 81* 58*  ALT 50 42  ALKPHOS 115 88  BILITOT 3.1* 1.2  PROT 5.2* 5.2*  ALBUMIN  1.7* 2.5*    Recent Labs Lab 09/16/15 1914  LIPASE 12   No results for input(s): AMMONIA in the last 168 hours. CBC:  Recent Labs Lab 09/16/15 1914  09/19/15 0640 09/20/15 0808 09/21/15 0531 09/22/15 0654 09/23/15 0735  WBC 11.0*  < > 4.5 3.6* 4.4 5.5 5.6  NEUTROABS 8.9*  --   --   --   --   --   --   HGB 10.0*  < > 7.3* 7.2* 8.2* 8.4* 7.4*  HCT 27.7*  < > 21.2* 21.4* 24.6* 25.7* 23.1*  MCV 102.6*  < > 107.1* 106.5* 106.0* 108.9* 110.5*  PLT 138*  < > 122* 110* 117* 162 203  < > = values in this interval not displayed. Cardiac Enzymes:  Recent Labs Lab 09/16/15 1914  TROPONINI <0.03   BNP: BNP (last 3 results) No results for input(s): BNP in the last 8760 hours.  ProBNP (last 3 results) No results for input(s): PROBNP in the last 8760 hours.  CBG: No results for input(s): GLUCAP in the last 168 hours.     Signed:  Jase Reep MD.  [facility] 09/23/2015, 3:01 PM

## 2015-09-23 NOTE — Progress Notes (Signed)
[facility].  [facility]

## 2015-09-23 NOTE — Care Management (Signed)
Patient Information   Patient Name [name], [name] ([id number]) Sex Female DOB 05/25/1955  Room Bed  A328 A328-01  Patient Demographics   Address [address] [place] [address] Phone [phone] [facility](Home) [phone] (Mobile)  Patient Ethnicity & Race   Ethnic Group Patient Race  Not Hispanic or Latino White or Caucasian  Emergency Contact(s)   Name Relation Home Work Mobile  Shatzer,Crystal Daughter [phone]    Documents on File    Status Date Received Description  Documents for the Patient  EMR Patient Summary Not Received    [place] HIPAA NOTICE OF PRIVACY - Scanned Received 03/15/10   [place] E-Signature HIPAA Notice of Privacy Received 03/15/10   [place] E-Signature HIPAA Notice of Privacy Spanish Not Received    Driver's License Received 03/15/10   Advance Directives/Living Will/HCPOA/POA Not Received    Driver's License Received 02/18/09   [place] HIPAA NOTICE OF PRIVACY - Scanned Received 02/18/09   Driver's License Received 03/15/10   Historic Radiology Documentation Not Received    Insurance Card Received 06/13/10   Insurance Card Not Received    [place] HIPAA NOTICE OF PRIVACY - Scanned Not Received  PSCAN ENTERED INFO: [id number]  Financial Application Not Received    AMB Intake Forms/Questionnaires Not Received    AMB Correspondence Not Received  06/13 controlled rx agreement   HIM ROI Authorization Not Received    Release of Information Not Received    HIM ROI Authorization Not Received  [facility]  HIM ROI Authorization Not Received    AMB Correspondence  11/15/12 10/14 Rx [facility]   Insurance Card Received 05/27/13   HIM ROI Authorization  05/28/13   AMB HH/NH/Hospice  06/28/13 03/15 ACUTE NOTE [facility]  AMB HH/NH/Hospice  02/12/13 POC [facility]  AMB Correspondence  08/09/13 OFFICE NOTE [facility]  HIM ROI Authorization  09/23/14 [employer]  Other Photo ID Not Received    HIM ROI  Authorization (Expired) 01/06/15 CT results of abdomen/pelvis for cotinuity of care.  Release of Information  01/07/15   HIM ROI Authorization (Expired) 01/29/15 CT of chest results for continuity of care.   Release of Information  01/30/15   HIM ROI Authorization (Expired) 09/15/15 [facility]  HIM ROI Authorization  09/16/15 CONTINUITY OF CARE  AMB Correspondence (Deleted) 08/01/11   AMB Correspondence (Deleted) 06/27/13 POC [place] [facility]  AMB HH/NH/Hospice (Deleted) 06/27/13 01/15 SPEECH TH[facility]APY POC [facility]  AMB HH/NH/Hospice (Deleted) 06/27/13 1/15 [facility]  AMB Correspondence (Deleted) 06/28/13 POC [place] [facility]  AMB HH/NH/Hospice (Deleted) 06/27/13 1/15 SPE[facility]CH TH[facility]APY POC [facility]  AMB HH/NH/Hospice (Deleted) 06/27/13 03/15 ACUTE NOTE [facility]  AMB HH/NH/Hospice (Deleted) 08/07/13 05/14 POC [facility]  Documents for the Encounter  AOB (Assignment of Insurance Benefits) Not Received    E-signature AOB Signed 09/16/15   MEDICARE RIGHTS Not Received    E-signature Medicare Rights Signed 09/16/15   ED Patient Billing Extract   ED PB Summary  ED Patient Billing Extract   ED Encounter Summary  ED Patient Billing Extract   ED PB Summary  EKG  09/17/15   Admission Information   Attending Provider Admitting Provider Admission Type Admission Date/Time  Elliot Cousinenise Fisher, MD Haydee Monicaachal A David, MD Emergency 09/16/15 1848  Discharge Date Hospital Service Auth/Cert Status Service Area   Internal Medicine Incomplete [place] SERVICE  AREA  Unit Room/Bed Admission Status   AP-DEPT 300 A328/A328-01 Admission (Confirmed)   Admission   Complaint    Hospital Account   Name Acct ID Class Status Primary Coverage  [name], [name] [id number] Inpatient Open UNITED HEALTHCARE MEDICARE [facility]      Guarantor Account (for  Hospital Account [id number])   Name Relation to Pt Service Area Active? Acct Type  [name], [name] Self CHSA Yes Personal/Family  Address Phone    [address] [place], [place] [address] [phone](H)        Coverage Information (for Hospital Account [id number])   1. [place]rice BurkeUNITED HEALTHCARE MEDICARE/UHC MEDICARE   F/O Payor/Plan Precert #  [facility]HC MEDICARE   Subscriber Subscriber #  [name], [name] [id number]  Address Phone  [address] [place], [place] [id number] [phone]  2. MEDICAID [place]/MEDICAID OF [place]   F/O Payor/Plan Precert #  MEDICAID [place]/MEDICAID OF [place]   Subscriber Subscriber #  [name], [name] [id number]  Address Phone  [address] [address] [place], [place] [address]

## 2015-10-09 DEATH — deceased

## 2016-12-16 IMAGING — CT CT ABD-PELV W/O CM
2 of 4 series · 17 of 46 positions shown, 19 images · non-contrast
Comparison: 01/05/2015

CLINICAL DATA: Nausea and vomiting for several months with
abdominal pain, initial encounter

EXAM:
CT ABDOMEN AND PELVIS WITHOUT CONTRAST
TECHNIQUE: Multidetector CT imaging of the abdomen and pelvis was performed
following the standard protocol without IV contrast.

[Series 2: routine abd pel without · axial · non-contrast · 0.96mm/px · z∈[-591,-86]mm · 14 of 111 slices shown, 16 images]
[im 5/111  soft-tissue]
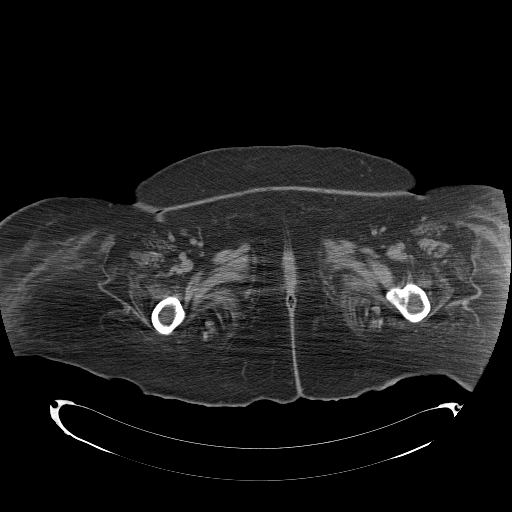
[im 5/111  bone]
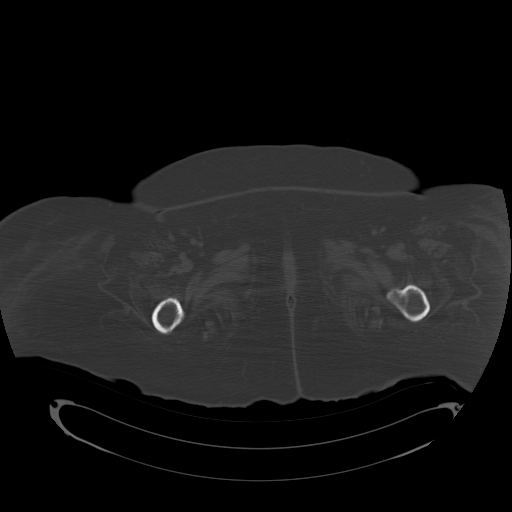
[im 15/111  soft-tissue]
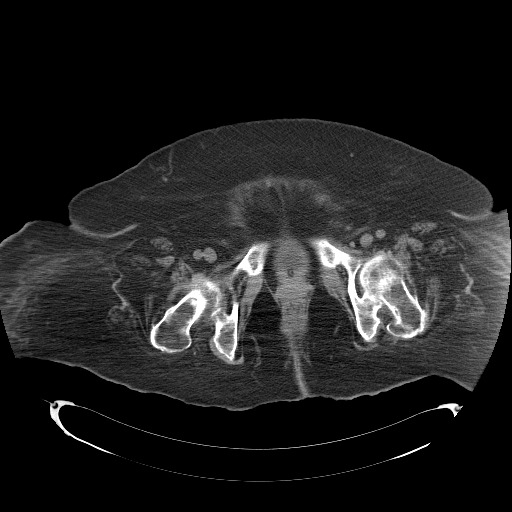
[im 20/111  soft-tissue]
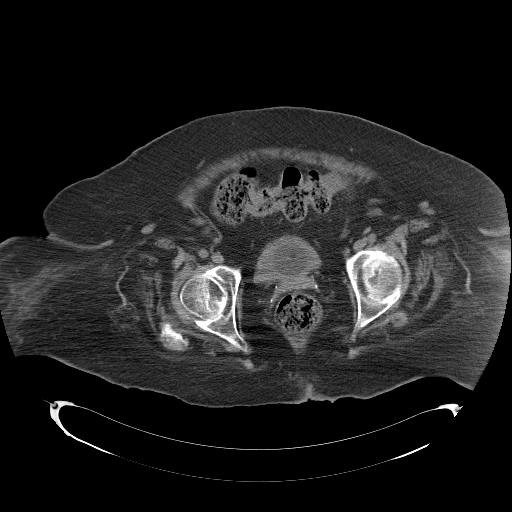
[im 29/111  soft-tissue]
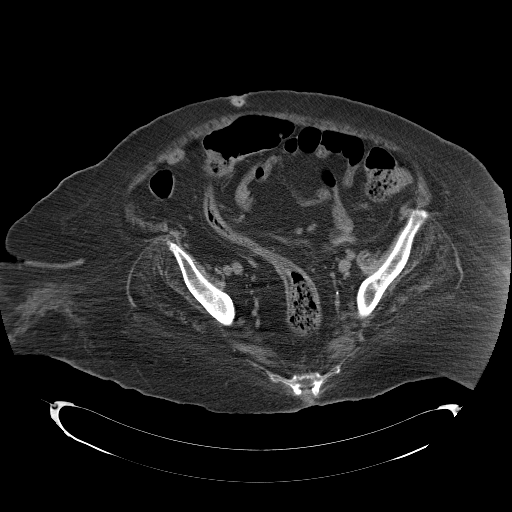
[im 39/111  soft-tissue]
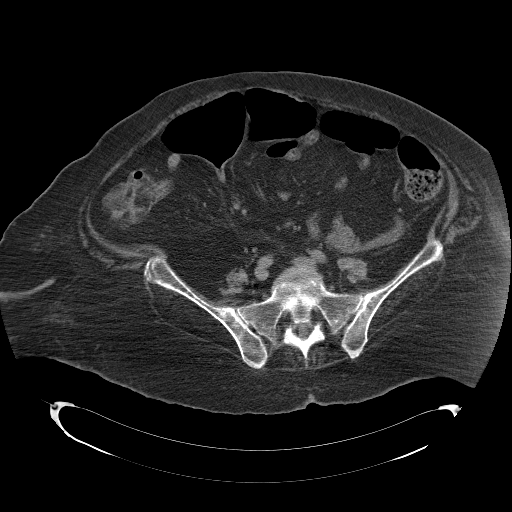
[im 44/111  soft-tissue]
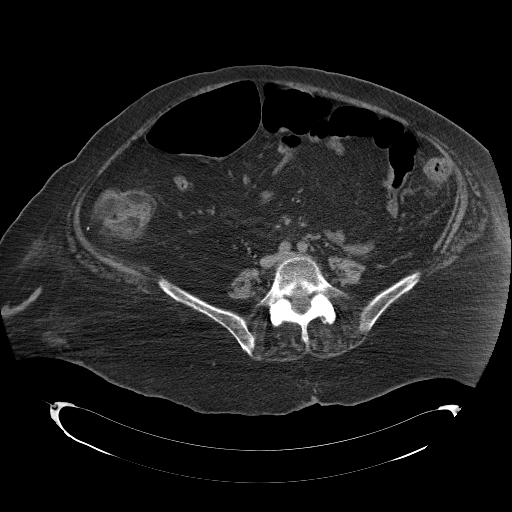
[im 53/111  soft-tissue]
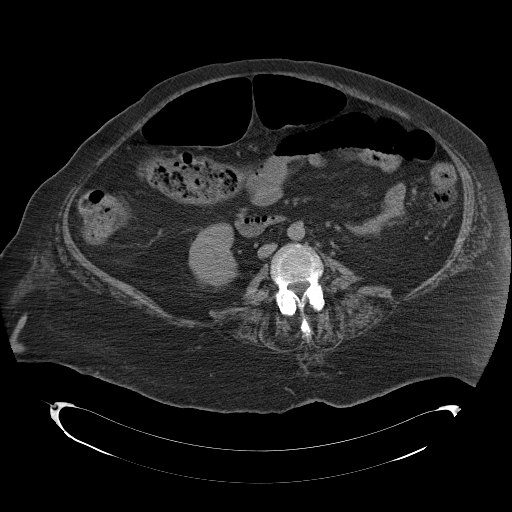
[im 58/111  soft-tissue]
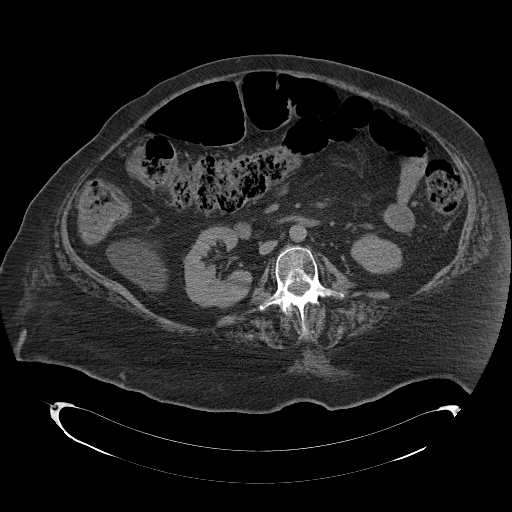
[im 67/111  soft-tissue]
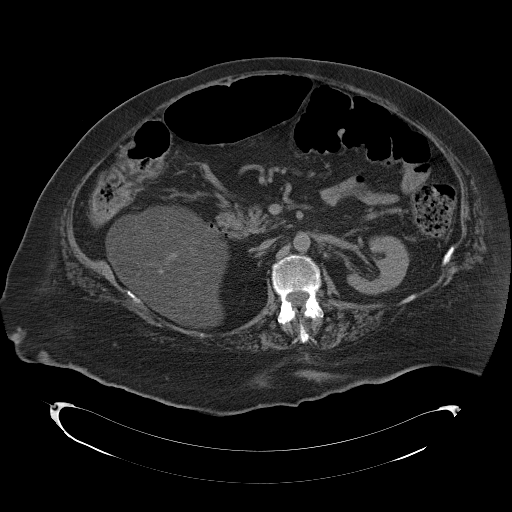
[im 67/111  bone]
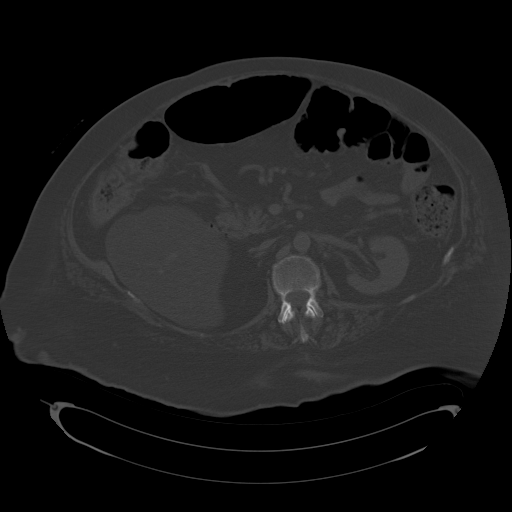
[im 72/111  soft-tissue]
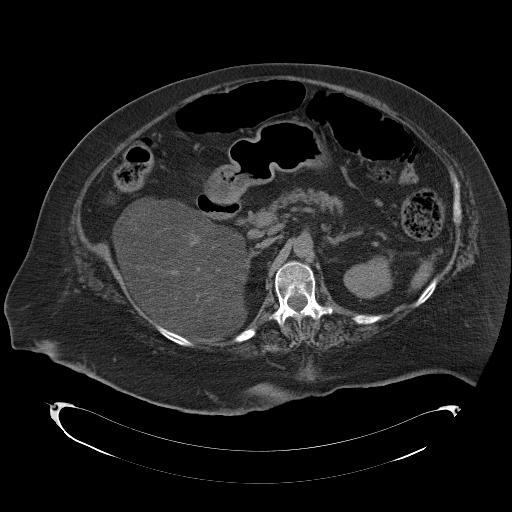
[im 82/111  soft-tissue]
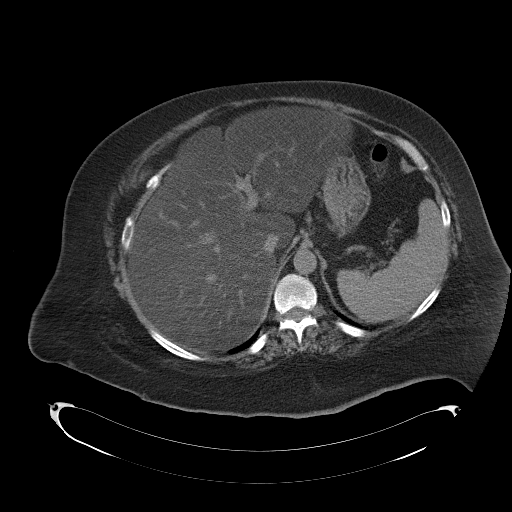
[im 91/111  soft-tissue]
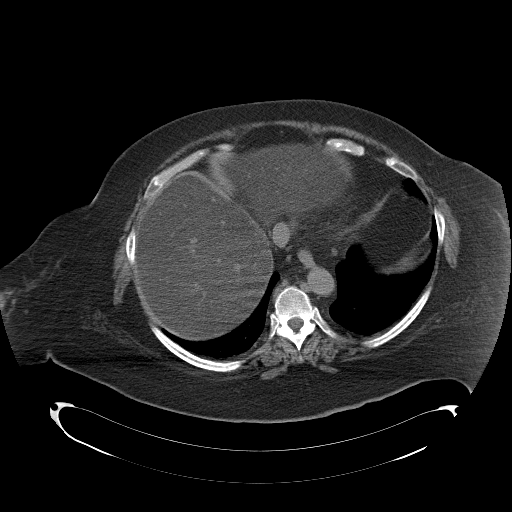
[im 96/111  soft-tissue]
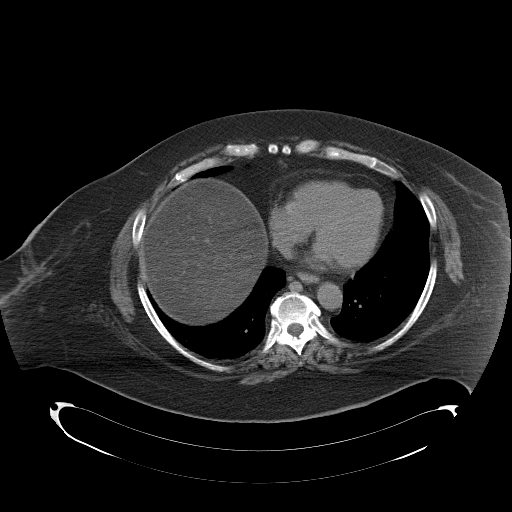
[im 106/111  soft-tissue]
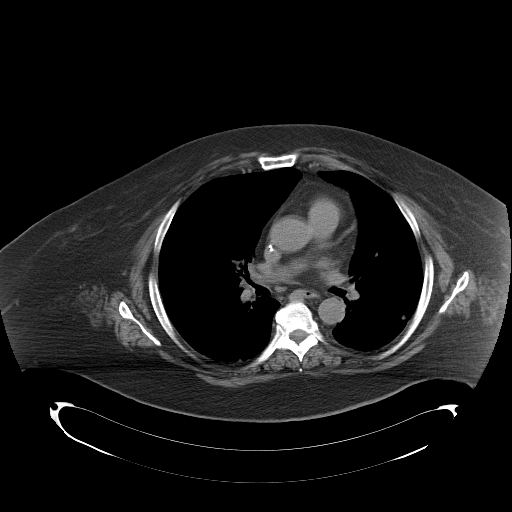

[Series 4: coronal · coronal · 1.06mm/px · 3 of 173 slices shown]
[im 58/173  soft-tissue]
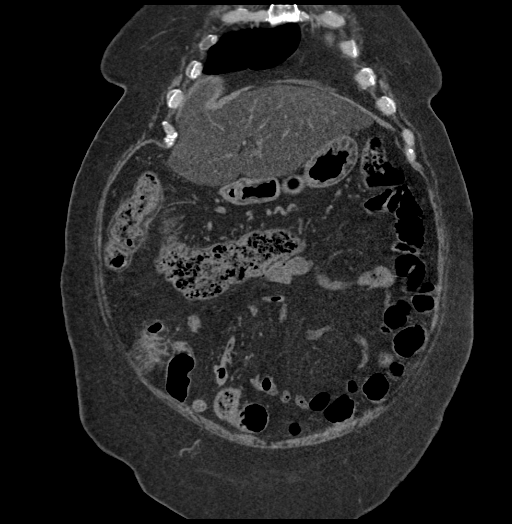
[im 77/173  soft-tissue]
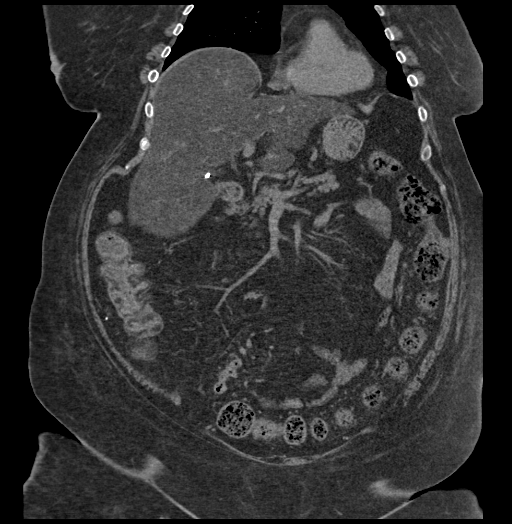
[im 96/173  soft-tissue]
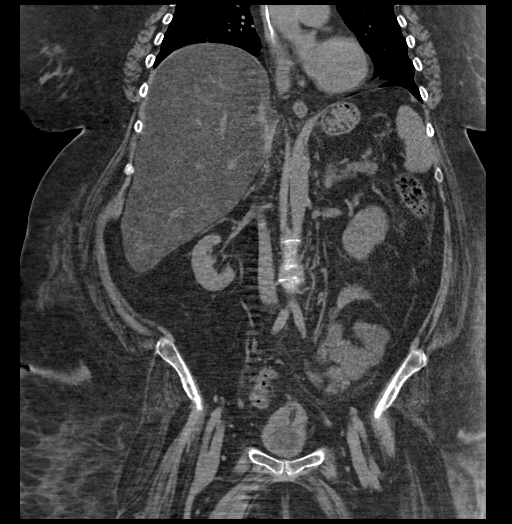

[17 of 46 positions shown; findings below may reference images not displayed]

FINDINGS: Lower chest:  Stable sub cm nodules are again identified

Hepatobiliary: Diffuse fatty infiltration of the liver is noted. The
gallbladder has been surgically removed. No hepatic mass is seen.

Pancreas: No mass or inflammatory process identified on this
un-enhanced exam.

Spleen: Within normal limits in size.

Adrenals/Urinary Tract: The adrenal glands are within normal limits.
Kidney show no obstructive changes. No calculi are seen. No focal
renal masses are noted. The bladder is decompressed by Foley
catheter.

Stomach/Bowel: Fecal material is noted throughout the colon without
obstructive change or significant constipation. The appendix is
within normal limits. No significant diverticular change is
identified.

Vascular/Lymphatic: No pathologically enlarged lymph nodes. No
evidence of abdominal aortic aneurysm.

Reproductive: No mass or other significant abnormality.

Other: None.

Musculoskeletal:  No suspicious bone lesions identified.
IMPRESSION: Stable sub cm lung nodules when compared with the prior examination.
Non-contrast chest CT at 12-18 months (from today's scan) is
considered optional for low-risk patients, but is recommended for
high-risk patients. This recommendation follows the consensus
statement: Guidelines for Management of Incidental Pulmonary Nodules
Detected on CT Images:From the [HOSPITAL] 5362; published
online before print (10.1148/[id number]).

Chronic changes without acute abnormality.
